# Patient Record
Sex: Male | Born: 1935 | Race: Black or African American | Hispanic: No | Smoking: Former smoker
Health system: Southern US, Community
[De-identification: ages and names within clinical notes are randomized; demographics above are authoritative.]

## PROBLEM LIST (undated history)

## (undated) DIAGNOSIS — K219 Gastro-esophageal reflux disease without esophagitis: Secondary | ICD-10-CM

## (undated) DIAGNOSIS — I10 Essential (primary) hypertension: Secondary | ICD-10-CM

## (undated) DIAGNOSIS — I509 Heart failure, unspecified: Secondary | ICD-10-CM

## (undated) DIAGNOSIS — M109 Gout, unspecified: Secondary | ICD-10-CM

## (undated) HISTORY — DX: Heart failure, unspecified: I50.9

---

## 1998-03-10 ENCOUNTER — Emergency Department (HOSPITAL_COMMUNITY): Admission: EM | Admit: 1998-03-10 | Discharge: 1998-03-10 | Payer: Self-pay | Admitting: Emergency Medicine

## 1998-03-10 ENCOUNTER — Encounter: Payer: Self-pay | Admitting: Emergency Medicine

## 2002-11-14 ENCOUNTER — Encounter: Payer: Self-pay | Admitting: Pulmonary Disease

## 2002-11-14 ENCOUNTER — Ambulatory Visit (HOSPITAL_COMMUNITY): Admission: RE | Admit: 2002-11-14 | Discharge: 2002-11-14 | Payer: Self-pay | Admitting: Pulmonary Disease

## 2003-04-15 ENCOUNTER — Emergency Department (HOSPITAL_COMMUNITY): Admission: EM | Admit: 2003-04-15 | Discharge: 2003-04-15 | Payer: Self-pay

## 2003-04-16 ENCOUNTER — Emergency Department (HOSPITAL_COMMUNITY): Admission: EM | Admit: 2003-04-16 | Discharge: 2003-04-16 | Payer: Self-pay | Admitting: Emergency Medicine

## 2004-02-02 ENCOUNTER — Emergency Department (HOSPITAL_COMMUNITY): Admission: EM | Admit: 2004-02-02 | Discharge: 2004-02-02 | Payer: Self-pay | Admitting: Family Medicine

## 2009-03-05 ENCOUNTER — Emergency Department (HOSPITAL_COMMUNITY): Admission: EM | Admit: 2009-03-05 | Discharge: 2009-03-05 | Payer: Self-pay | Admitting: Family Medicine

## 2009-04-17 ENCOUNTER — Emergency Department (HOSPITAL_COMMUNITY): Admission: EM | Admit: 2009-04-17 | Discharge: 2009-04-17 | Payer: Self-pay | Admitting: Family Medicine

## 2009-04-17 ENCOUNTER — Emergency Department (HOSPITAL_COMMUNITY): Admission: EM | Admit: 2009-04-17 | Discharge: 2009-04-17 | Payer: Self-pay | Admitting: Emergency Medicine

## 2009-09-01 ENCOUNTER — Emergency Department (HOSPITAL_COMMUNITY): Admission: EM | Admit: 2009-09-01 | Discharge: 2009-09-01 | Payer: Self-pay | Admitting: Family Medicine

## 2009-10-01 ENCOUNTER — Inpatient Hospital Stay (HOSPITAL_COMMUNITY): Admission: AD | Admit: 2009-10-01 | Discharge: 2009-10-05 | Payer: Self-pay | Admitting: Cardiology

## 2009-10-04 ENCOUNTER — Encounter (INDEPENDENT_AMBULATORY_CARE_PROVIDER_SITE_OTHER): Payer: Self-pay | Admitting: Cardiology

## 2010-06-04 LAB — COMPREHENSIVE METABOLIC PANEL
ALT: 19 U/L (ref 0–53)
AST: 27 U/L (ref 0–37)
Albumin: 3.3 g/dL — ABNORMAL LOW (ref 3.5–5.2)
Alkaline Phosphatase: 38 U/L — ABNORMAL LOW (ref 39–117)
BUN: 24 mg/dL — ABNORMAL HIGH (ref 6–23)
CO2: 26 mEq/L (ref 19–32)
Calcium: 7.8 mg/dL — ABNORMAL LOW (ref 8.4–10.5)
Chloride: 106 mEq/L (ref 96–112)
Creatinine, Ser: 1.55 mg/dL — ABNORMAL HIGH (ref 0.4–1.5)
GFR calc Af Amer: 52 mL/min — ABNORMAL LOW (ref >60)
GFR calc non Af Amer: 43 mL/min — ABNORMAL LOW (ref >60)
Glucose, Bld: 101 mg/dL — ABNORMAL HIGH (ref 70–99)
Potassium: 3.4 mEq/L — ABNORMAL LOW (ref 3.5–5.1)
Sodium: 141 mEq/L (ref 135–145)
Total Bilirubin: 1.5 mg/dL — ABNORMAL HIGH (ref 0.3–1.2)
Total Protein: 5.2 g/dL — ABNORMAL LOW (ref 6.0–8.3)

## 2010-06-04 LAB — CBC
HCT: 36 % — ABNORMAL LOW (ref 39.0–52.0)
HCT: 36.1 % — ABNORMAL LOW (ref 39.0–52.0)
HCT: 39.8 % (ref 39.0–52.0)
HCT: 40.9 % (ref 39.0–52.0)
Hemoglobin: 12.1 g/dL — ABNORMAL LOW (ref 13.0–17.0)
Hemoglobin: 12.7 g/dL — ABNORMAL LOW (ref 13.0–17.0)
Hemoglobin: 13 g/dL (ref 13.0–17.0)
Hemoglobin: 13.5 g/dL (ref 13.0–17.0)
MCH: 30.2 pg (ref 26.0–34.0)
MCH: 30.9 pg (ref 26.0–34.0)
MCH: 31 pg (ref 26.0–34.0)
MCH: 32.8 pg (ref 26.0–34.0)
MCHC: 32.5 g/dL (ref 30.0–36.0)
MCHC: 33 g/dL (ref 30.0–36.0)
MCHC: 33.5 g/dL (ref 30.0–36.0)
MCHC: 35.2 g/dL (ref 30.0–36.0)
MCV: 92.3 fL (ref 78.0–100.0)
MCV: 92.9 fL (ref 78.0–100.0)
MCV: 93 fL (ref 78.0–100.0)
MCV: 93.6 fL (ref 78.0–100.0)
Platelets: 112 10*3/uL — ABNORMAL LOW (ref 150–400)
Platelets: 144 10*3/uL — ABNORMAL LOW (ref 150–400)
Platelets: 151 10*3/uL (ref 150–400)
Platelets: 170 10*3/uL (ref 150–400)
RBC: 3.87 MIL/uL — ABNORMAL LOW (ref 4.22–5.81)
RBC: 3.91 MIL/uL — ABNORMAL LOW (ref 4.22–5.81)
RBC: 4.29 MIL/uL (ref 4.22–5.81)
RBC: 4.37 MIL/uL (ref 4.22–5.81)
RDW: 16.7 % — ABNORMAL HIGH (ref 11.5–15.5)
RDW: 17 % — ABNORMAL HIGH (ref 11.5–15.5)
RDW: 17 % — ABNORMAL HIGH (ref 11.5–15.5)
RDW: 17.1 % — ABNORMAL HIGH (ref 11.5–15.5)
WBC: 6.4 10*3/uL (ref 4.0–10.5)
WBC: 6.8 10*3/uL (ref 4.0–10.5)
WBC: 7.7 10*3/uL (ref 4.0–10.5)
WBC: 8.1 10*3/uL (ref 4.0–10.5)

## 2010-06-04 LAB — BASIC METABOLIC PANEL
BUN: 23 mg/dL (ref 6–23)
BUN: 25 mg/dL — ABNORMAL HIGH (ref 6–23)
BUN: 26 mg/dL — ABNORMAL HIGH (ref 6–23)
BUN: 28 mg/dL — ABNORMAL HIGH (ref 6–23)
CO2: 26 mEq/L (ref 19–32)
CO2: 29 mEq/L (ref 19–32)
CO2: 32 mEq/L (ref 19–32)
CO2: 33 mEq/L — ABNORMAL HIGH (ref 19–32)
Calcium: 7.7 mg/dL — ABNORMAL LOW (ref 8.4–10.5)
Calcium: 7.8 mg/dL — ABNORMAL LOW (ref 8.4–10.5)
Calcium: 8 mg/dL — ABNORMAL LOW (ref 8.4–10.5)
Calcium: 8.1 mg/dL — ABNORMAL LOW (ref 8.4–10.5)
Chloride: 104 mEq/L (ref 96–112)
Chloride: 107 mEq/L (ref 96–112)
Chloride: 97 mEq/L (ref 96–112)
Chloride: 98 mEq/L (ref 96–112)
Creatinine, Ser: 1.37 mg/dL (ref 0.4–1.5)
Creatinine, Ser: 1.47 mg/dL (ref 0.4–1.5)
Creatinine, Ser: 1.54 mg/dL — ABNORMAL HIGH (ref 0.4–1.5)
Creatinine, Ser: 1.61 mg/dL — ABNORMAL HIGH (ref 0.4–1.5)
GFR calc Af Amer: 50 mL/min — ABNORMAL LOW (ref >60)
GFR calc Af Amer: 53 mL/min — ABNORMAL LOW (ref >60)
GFR calc Af Amer: 55 mL/min — ABNORMAL LOW (ref >60)
GFR calc Af Amer: >60 mL/min (ref >60)
GFR calc non Af Amer: 41 mL/min — ABNORMAL LOW (ref >60)
GFR calc non Af Amer: 43 mL/min — ABNORMAL LOW (ref >60)
GFR calc non Af Amer: 46 mL/min — ABNORMAL LOW (ref >60)
GFR calc non Af Amer: 50 mL/min — ABNORMAL LOW (ref >60)
Glucose, Bld: 101 mg/dL — ABNORMAL HIGH (ref 70–99)
Glucose, Bld: 105 mg/dL — ABNORMAL HIGH (ref 70–99)
Glucose, Bld: 116 mg/dL — ABNORMAL HIGH (ref 70–99)
Glucose, Bld: 137 mg/dL — ABNORMAL HIGH (ref 70–99)
Potassium: 3.5 mEq/L (ref 3.5–5.1)
Potassium: 3.7 mEq/L (ref 3.5–5.1)
Potassium: 4.1 mEq/L (ref 3.5–5.1)
Potassium: 4.9 mEq/L (ref 3.5–5.1)
Sodium: 137 mEq/L (ref 135–145)
Sodium: 140 mEq/L (ref 135–145)
Sodium: 142 mEq/L (ref 135–145)
Sodium: 142 mEq/L (ref 135–145)

## 2010-06-04 LAB — BRAIN NATRIURETIC PEPTIDE
Pro B Natriuretic peptide (BNP): 1128 pg/mL — ABNORMAL HIGH (ref 0.0–100.0)
Pro B Natriuretic peptide (BNP): 2333 pg/mL — ABNORMAL HIGH (ref 0.0–100.0)
Pro B Natriuretic peptide (BNP): 729 pg/mL — ABNORMAL HIGH (ref 0.0–100.0)

## 2010-06-04 LAB — MAGNESIUM: Magnesium: 1.9 mg/dL (ref 1.5–2.5)

## 2010-06-06 LAB — URINE CULTURE
Colony Count: NO GROWTH
Culture: NO GROWTH

## 2010-06-06 LAB — BRAIN NATRIURETIC PEPTIDE: Pro B Natriuretic peptide (BNP): <30 pg/mL (ref 0.0–100.0)

## 2010-06-06 LAB — DIFFERENTIAL
Basophils Absolute: 0 10*3/uL (ref 0.0–0.1)
Basophils Relative: 0 % (ref 0–1)
Eosinophils Absolute: 0 10*3/uL (ref 0.0–0.7)
Eosinophils Relative: 1 % (ref 0–5)
Lymphocytes Relative: 13 % (ref 12–46)
Lymphs Abs: 0.9 10*3/uL (ref 0.7–4.0)
Monocytes Absolute: 0.5 10*3/uL (ref 0.1–1.0)
Monocytes Relative: 8 % (ref 3–12)
Neutro Abs: 5.1 10*3/uL (ref 1.7–7.7)
Neutrophils Relative %: 78 % — ABNORMAL HIGH (ref 43–77)

## 2010-06-06 LAB — PROTIME-INR
INR: 1.09 (ref 0.00–1.49)
Prothrombin Time: 14 seconds (ref 11.6–15.2)

## 2010-06-06 LAB — COMPREHENSIVE METABOLIC PANEL
ALT: 40 U/L (ref 0–53)
AST: 43 U/L — ABNORMAL HIGH (ref 0–37)
Albumin: 4 g/dL (ref 3.5–5.2)
Alkaline Phosphatase: 54 U/L (ref 39–117)
BUN: 14 mg/dL (ref 6–23)
CO2: 23 mEq/L (ref 19–32)
Calcium: 8.4 mg/dL (ref 8.4–10.5)
Chloride: 110 mEq/L (ref 96–112)
Creatinine, Ser: 1.24 mg/dL (ref 0.4–1.5)
GFR calc Af Amer: >60 mL/min (ref >60)
GFR calc non Af Amer: 56 mL/min — ABNORMAL LOW (ref >60)
Glucose, Bld: 104 mg/dL — ABNORMAL HIGH (ref 70–99)
Potassium: 4.1 mEq/L (ref 3.5–5.1)
Sodium: 142 mEq/L (ref 135–145)
Total Bilirubin: 1 mg/dL (ref 0.3–1.2)
Total Protein: 7 g/dL (ref 6.0–8.3)

## 2010-06-06 LAB — CBC
HCT: 41.8 % (ref 39.0–52.0)
Hemoglobin: 14 g/dL (ref 13.0–17.0)
MCHC: 33.4 g/dL (ref 30.0–36.0)
MCV: 94.3 fL (ref 78.0–100.0)
Platelets: 211 10*3/uL (ref 150–400)
RBC: 4.44 MIL/uL (ref 4.22–5.81)
RDW: 13.4 % (ref 11.5–15.5)
WBC: 6.6 10*3/uL (ref 4.0–10.5)

## 2010-06-06 LAB — URINALYSIS, ROUTINE W REFLEX MICROSCOPIC
Ketones, ur: NEGATIVE mg/dL
Leukocytes, UA: NEGATIVE
Nitrite: NEGATIVE
Specific Gravity, Urine: 1.021 (ref 1.005–1.030)
Urobilinogen, UA: 0.2 mg/dL (ref 0.0–1.0)
pH: 5 (ref 5.0–8.0)

## 2010-06-06 LAB — URINE MICROSCOPIC-ADD ON

## 2010-06-06 LAB — POCT CARDIAC MARKERS
CKMB, poc: 3.1 ng/mL (ref 1.0–8.0)
Troponin i, poc: <0.05 ng/mL (ref 0.00–0.09)

## 2010-06-06 LAB — APTT: aPTT: 33 seconds (ref 24–37)

## 2012-02-21 ENCOUNTER — Encounter (HOSPITAL_COMMUNITY): Payer: Self-pay | Admitting: Emergency Medicine

## 2012-02-21 ENCOUNTER — Emergency Department (HOSPITAL_COMMUNITY)
Admission: EM | Admit: 2012-02-21 | Discharge: 2012-02-21 | Disposition: A | Discharge status: Home / Self Care | Payer: Medicare Other | Attending: Emergency Medicine | Admitting: Emergency Medicine

## 2012-02-21 DIAGNOSIS — Z79899 Other long term (current) drug therapy: Secondary | ICD-10-CM | POA: Insufficient documentation

## 2012-02-21 DIAGNOSIS — M79609 Pain in unspecified limb: Secondary | ICD-10-CM | POA: Insufficient documentation

## 2012-02-21 DIAGNOSIS — I10 Essential (primary) hypertension: Secondary | ICD-10-CM | POA: Insufficient documentation

## 2012-02-21 DIAGNOSIS — M25539 Pain in unspecified wrist: Secondary | ICD-10-CM | POA: Insufficient documentation

## 2012-02-21 DIAGNOSIS — G8929 Other chronic pain: Secondary | ICD-10-CM | POA: Insufficient documentation

## 2012-02-21 DIAGNOSIS — IMO0002 Reserved for concepts with insufficient information to code with codable children: Secondary | ICD-10-CM | POA: Insufficient documentation

## 2012-02-21 DIAGNOSIS — M109 Gout, unspecified: Secondary | ICD-10-CM | POA: Insufficient documentation

## 2012-02-21 DIAGNOSIS — M7989 Other specified soft tissue disorders: Secondary | ICD-10-CM | POA: Insufficient documentation

## 2012-02-21 HISTORY — DX: Gout, unspecified: M10.9

## 2012-02-21 HISTORY — DX: Essential (primary) hypertension: I10

## 2012-02-21 MED ORDER — PREDNISONE 20 MG PO TABS: ORAL_TABLET | ORAL | Status: DC

## 2012-02-21 NOTE — ED Provider Notes (Signed)
History   This chart was scribed for [name], MD by Gerlean Ren, ED Scribe. This patient was seen in room TR05C/TR05C and the patient's care was started at 11:04 AM    CSN: [id number]  Arrival date & time 02/21/12  1017   First MD Initiated Contact with Patient 02/21/12 1104      Chief Complaint  Patient presents with  . Leg Pain     The history is provided by the patient. No language interpreter was used.   EDDER Allen is a 78 y.o. male with h/o gout, HTN, and edema who presents to the Emergency Department complaining of chronic waxing-and-waning right leg pain occurring nearly everyday since July with no injury, fall, or trauma as cause that is worsened when bearing weight and ambulating.  No OTC used for pain.  Pt denies pain in the right hip and right knee and states pain feels muscular in nature rather than bone or joint related.  Pt denies color change, weakness, numbness, or swelling in right leg. Pt also complains of a typical gout flare-up in right wrist over past 3 days that is worsened when palpated but denies weakness or numbness in right hand.  Pt is not currently on any medication for gout control.  Pt denies fever, confusion, dyspnea, cough, chest pain, urinary symptoms, irregular bowel movements, blood in stool, incontinence, back pain, sore throat, and rash as associated.  Pt denies tobacco use but reports alcohol use.    Past Medical History  Diagnosis Date  . Gout   . Hypertension     History reviewed. No pertinent past surgical history.  History reviewed. No pertinent family history.  History  Substance Use Topics  . Smoking status: Never Smoker   . Smokeless tobacco: Not on file  . Alcohol Use: Yes      Review of Systems 10 Systems reviewed and are negative for acute change except as noted in the HPI.  Allergies  Review of patient's allergies indicates no known allergies.  Home Medications   Current Outpatient Rx  Name  Route  Sig  Dispense   Refill  . FUROSEMIDE 20 MG PO TABS   Oral   Take 40 mg by mouth daily.         . ISOSORBIDE MONONITRATE ER 60 MG PO TB24   Oral   Take 60 mg by mouth daily.         Marland Kitchen LISINOPRIL 20 MG PO TABS   Oral   Take 40 mg by mouth daily.         Marland Kitchen PREDNISONE 10 MG PO TABS   Oral   Take 5-40 mg by mouth daily.         Marland Kitchen TAMSULOSIN HCL 0.4 MG PO CAPS   Oral   Take 0.4 mg by mouth daily.         Marland Kitchen PREDNISONE 20 MG PO TABS      3 tabs po daily x 3 days   9 tablet   0     BP 167/73  Pulse 68  Temp 99.1 F (37.3 C) (Oral)  Resp 18  SpO2 99%  Physical Exam  Nursing note and vitals reviewed. Constitutional:       Awake, alert, nontoxic appearance with baseline speech.  HENT:  Head: Atraumatic.  Eyes: Pupils are equal, round, and reactive to light. Right eye exhibits no discharge. Left eye exhibits no discharge.  Neck: Neck supple.  Cardiovascular: Normal rate and regular rhythm.  No murmur heard. Pulmonary/Chest: Effort normal and breath sounds normal. No respiratory distress. He has no wheezes. He has no rales. He exhibits no tenderness.  Abdominal: Soft. Bowel sounds are normal. He exhibits no mass. There is no tenderness. There is no rebound.  Musculoskeletal:       Thoracic back: He exhibits no tenderness.       Lumbar back: He exhibits no tenderness.       Bilateral lower extremities non tender without new rashes or color change, baseline ROM, feet warm and dry, CR<2 secs all digits bilaterally, sensation baseline light touch bilaterally for pt, motor symmetric bilateral 5 / 5 hip flexion, quadriceps, hamstrings, EHL, foot dorsiflexion, foot plantarflexion,  Right hip joint non-tender with good ROM, right knee non-tender with good ROM, no edema, right lateral thigh soft tissue tenderness, back non-tender. Right arm, shoulder, elbow non-tender. Right wrist moderately tender with mild swelling and mild erythema, right hand CR<2 seconds, intact median, radial, ulnar  nerve function  Neurological:       Mental status baseline for patient.  Upper extremity motor strength and sensation intact and symmetric bilaterally.  Skin: No rash noted.  Psychiatric: He has a normal mood and affect.    ED Course  Procedures (including critical care time) DIAGNOSTIC STUDIES: Oxygen Saturation is 99% on room air, normal by my interpretation.    COORDINATION OF CARE: 11:15 AM- Patient informed of clinical course, understands medical decision-making process, and agrees with plan.  Discussed narcotic pain medication with pt but pt refused.  Discussed short-term steroid use.  Labs Reviewed - No data to display No results found.   1. Gout   2. Chronic leg pain       MDM  Pt stable in ED with no significant deterioration in condition.  Patient / Family / Caregiver informed of clinical course, understand medical decision-making process, and agree with plan.  I doubt any other EMC precluding discharge at this time including, but not necessarily limited to the following:SBI, cauda equina.  I personally performed the services described in this documentation, which was scribed in my presence. The recorded information has been reviewed and is accurate.        [name], MD 02/26/12 [phone]

## 2012-02-21 NOTE — ED Notes (Signed)
Per EMS: pt from home c/o right leg and right wrist pain; pt sts feels could be gout flare x 3 days; pt sts no meds; pt denies injury

## 2012-02-21 NOTE — ED Notes (Signed)
Pt states right thigh, leg pain- worse when releases leg from weight bearing position. Also has right wrist pain with swelling noted, hx of gout.

## 2012-10-10 ENCOUNTER — Other Ambulatory Visit (HOSPITAL_COMMUNITY): Payer: Self-pay | Admitting: Urology

## 2012-10-10 DIAGNOSIS — C61 Malignant neoplasm of prostate: Secondary | ICD-10-CM

## 2012-10-24 ENCOUNTER — Other Ambulatory Visit (HOSPITAL_COMMUNITY): Payer: Self-pay | Admitting: Urology

## 2012-10-24 DIAGNOSIS — C61 Malignant neoplasm of prostate: Secondary | ICD-10-CM

## 2012-10-28 ENCOUNTER — Encounter (HOSPITAL_COMMUNITY): Payer: Self-pay

## 2012-10-28 ENCOUNTER — Encounter (HOSPITAL_COMMUNITY)
Admission: RE | Admit: 2012-10-28 | Discharge: 2012-10-28 | Disposition: A | Discharge status: Home / Self Care | Payer: Medicare Other | Source: Ambulatory Visit | Attending: Urology | Admitting: Urology

## 2012-10-28 DIAGNOSIS — M25559 Pain in unspecified hip: Secondary | ICD-10-CM | POA: Insufficient documentation

## 2012-10-28 DIAGNOSIS — C61 Malignant neoplasm of prostate: Secondary | ICD-10-CM | POA: Insufficient documentation

## 2012-10-28 DIAGNOSIS — M79609 Pain in unspecified limb: Secondary | ICD-10-CM | POA: Insufficient documentation

## 2012-10-28 MED ORDER — TECHNETIUM TC 99M MEDRONATE IV KIT
26.7000 | PACK | Freq: Once | INTRAVENOUS | Status: AC | PRN
  Administered 2012-10-28: 26.7 via INTRAVENOUS

## 2012-10-29 ENCOUNTER — Ambulatory Visit (HOSPITAL_COMMUNITY)
Admission: RE | Admit: 2012-10-29 | Discharge: 2012-10-29 | Disposition: A | Discharge status: Home / Self Care | Payer: Medicare Other | Source: Ambulatory Visit | Attending: Urology | Admitting: Urology

## 2012-10-29 ENCOUNTER — Other Ambulatory Visit (HOSPITAL_COMMUNITY): Payer: Medicare Other

## 2012-10-29 ENCOUNTER — Other Ambulatory Visit (HOSPITAL_COMMUNITY): Payer: Self-pay | Admitting: Urology

## 2012-10-29 DIAGNOSIS — C61 Malignant neoplasm of prostate: Secondary | ICD-10-CM

## 2012-10-29 DIAGNOSIS — M79609 Pain in unspecified limb: Secondary | ICD-10-CM | POA: Insufficient documentation

## 2012-10-29 DIAGNOSIS — M549 Dorsalgia, unspecified: Secondary | ICD-10-CM | POA: Insufficient documentation

## 2012-10-29 DIAGNOSIS — M1610 Unilateral primary osteoarthritis, unspecified hip: Secondary | ICD-10-CM | POA: Insufficient documentation

## 2012-10-29 DIAGNOSIS — N433 Hydrocele, unspecified: Secondary | ICD-10-CM | POA: Insufficient documentation

## 2012-10-29 MED ORDER — GADOBENATE DIMEGLUMINE 529 MG/ML IV SOLN
20.0000 mL | Freq: Once | INTRAVENOUS | Status: AC | PRN
  Administered 2012-10-29: 20 mL via INTRAVENOUS

## 2012-10-31 ENCOUNTER — Ambulatory Visit (HOSPITAL_COMMUNITY)
Admission: RE | Admit: 2012-10-31 | Discharge: 2012-10-31 | Disposition: A | Discharge status: Home / Self Care | Payer: Medicare Other | Source: Ambulatory Visit | Attending: Urology | Admitting: Urology

## 2012-10-31 ENCOUNTER — Other Ambulatory Visit (HOSPITAL_COMMUNITY): Payer: Self-pay | Admitting: Urology

## 2012-10-31 DIAGNOSIS — Z8546 Personal history of malignant neoplasm of prostate: Secondary | ICD-10-CM | POA: Insufficient documentation

## 2012-10-31 DIAGNOSIS — M898X9 Other specified disorders of bone, unspecified site: Secondary | ICD-10-CM | POA: Insufficient documentation

## 2012-10-31 DIAGNOSIS — M25469 Effusion, unspecified knee: Secondary | ICD-10-CM | POA: Insufficient documentation

## 2012-10-31 DIAGNOSIS — M79609 Pain in unspecified limb: Secondary | ICD-10-CM | POA: Insufficient documentation

## 2012-10-31 DIAGNOSIS — M25859 Other specified joint disorders, unspecified hip: Secondary | ICD-10-CM | POA: Insufficient documentation

## 2012-10-31 DIAGNOSIS — C61 Malignant neoplasm of prostate: Secondary | ICD-10-CM

## 2012-10-31 DIAGNOSIS — M25669 Stiffness of unspecified knee, not elsewhere classified: Secondary | ICD-10-CM | POA: Insufficient documentation

## 2012-11-29 ENCOUNTER — Encounter (HOSPITAL_COMMUNITY): Payer: Self-pay | Admitting: Emergency Medicine

## 2012-11-29 ENCOUNTER — Observation Stay (HOSPITAL_COMMUNITY)
Admission: EM | Admit: 2012-11-29 | Discharge: 2012-12-01 | Disposition: A | Discharge status: Home / Self Care | Payer: Medicare Other | Attending: Internal Medicine | Admitting: Internal Medicine

## 2012-11-29 DIAGNOSIS — M79609 Pain in unspecified limb: Secondary | ICD-10-CM

## 2012-11-29 DIAGNOSIS — R944 Abnormal results of kidney function studies: Secondary | ICD-10-CM | POA: Insufficient documentation

## 2012-11-29 DIAGNOSIS — Z79899 Other long term (current) drug therapy: Secondary | ICD-10-CM | POA: Insufficient documentation

## 2012-11-29 DIAGNOSIS — N400 Enlarged prostate without lower urinary tract symptoms: Secondary | ICD-10-CM

## 2012-11-29 DIAGNOSIS — M609 Myositis, unspecified: Secondary | ICD-10-CM

## 2012-11-29 DIAGNOSIS — R5381 Other malaise: Secondary | ICD-10-CM

## 2012-11-29 DIAGNOSIS — M79606 Pain in leg, unspecified: Secondary | ICD-10-CM

## 2012-11-29 DIAGNOSIS — M109 Gout, unspecified: Secondary | ICD-10-CM

## 2012-11-29 DIAGNOSIS — R319 Hematuria, unspecified: Secondary | ICD-10-CM

## 2012-11-29 DIAGNOSIS — R748 Abnormal levels of other serum enzymes: Secondary | ICD-10-CM | POA: Diagnosis present

## 2012-11-29 DIAGNOSIS — I10 Essential (primary) hypertension: Secondary | ICD-10-CM

## 2012-11-29 DIAGNOSIS — F29 Unspecified psychosis not due to a substance or known physiological condition: Secondary | ICD-10-CM | POA: Insufficient documentation

## 2012-11-29 DIAGNOSIS — R29898 Other symptoms and signs involving the musculoskeletal system: Principal | ICD-10-CM | POA: Insufficient documentation

## 2012-11-29 DIAGNOSIS — R269 Unspecified abnormalities of gait and mobility: Secondary | ICD-10-CM | POA: Diagnosis present

## 2012-11-29 DIAGNOSIS — K219 Gastro-esophageal reflux disease without esophagitis: Secondary | ICD-10-CM | POA: Insufficient documentation

## 2012-11-29 LAB — BASIC METABOLIC PANEL
BUN: 18 mg/dL (ref 6–23)
CO2: 25 mEq/L (ref 19–32)
Chloride: 96 mEq/L (ref 96–112)
Creatinine, Ser: 1.14 mg/dL (ref 0.50–1.35)
Glucose, Bld: 96 mg/dL (ref 70–99)
Potassium: 3.7 mEq/L (ref 3.5–5.1)

## 2012-11-29 LAB — CBC WITH DIFFERENTIAL/PLATELET
Basophils Relative: 0 % (ref 0–1)
HCT: 38.9 % — ABNORMAL LOW (ref 39.0–52.0)
Hemoglobin: 13 g/dL (ref 13.0–17.0)
Lymphs Abs: 1.2 10*3/uL (ref 0.7–4.0)
MCHC: 33.4 g/dL (ref 30.0–36.0)
Monocytes Absolute: 0.8 10*3/uL (ref 0.1–1.0)
Monocytes Relative: 9 % (ref 3–12)
Neutro Abs: 7.1 10*3/uL (ref 1.7–7.7)
Neutrophils Relative %: 77 % (ref 43–77)
RBC: 4.32 MIL/uL (ref 4.22–5.81)

## 2012-11-29 LAB — URINALYSIS, ROUTINE W REFLEX MICROSCOPIC
Bilirubin Urine: NEGATIVE
Glucose, UA: NEGATIVE mg/dL
Ketones, ur: NEGATIVE mg/dL
Nitrite: NEGATIVE
Specific Gravity, Urine: 1.022 (ref 1.005–1.030)
pH: 5 (ref 5.0–8.0)

## 2012-11-29 LAB — HEPATIC FUNCTION PANEL
ALT: 11 U/L (ref 0–53)
AST: 34 U/L (ref 0–37)
Albumin: 3.5 g/dL (ref 3.5–5.2)
Alkaline Phosphatase: 67 U/L (ref 39–117)
Total Protein: 7.1 g/dL (ref 6.0–8.3)

## 2012-11-29 LAB — URINE MICROSCOPIC-ADD ON

## 2012-11-29 LAB — RHEUMATOID FACTOR: Rhuematoid fact SerPl-aCnc: 19 IU/mL — ABNORMAL HIGH (ref <=14)

## 2012-11-29 LAB — TSH: TSH: 3.805 u[IU]/mL (ref 0.350–4.500)

## 2012-11-29 LAB — CK: Total CK: 2805 U/L — ABNORMAL HIGH (ref 7–232)

## 2012-11-29 MED ORDER — PANTOPRAZOLE SODIUM 40 MG PO TBEC
40.0000 mg | DELAYED_RELEASE_TABLET | Freq: Every day | ORAL | Status: DC
  Administered 2012-11-29 – 2012-12-01 (×3): 40 mg via ORAL
  Filled 2012-11-29 (×3): qty 1

## 2012-11-29 MED ORDER — SODIUM CHLORIDE 0.9 % IV SOLN
INTRAVENOUS | Status: DC
  Administered 2012-11-29: 15:00:00 via INTRAVENOUS

## 2012-11-29 MED ORDER — DIAZEPAM 5 MG PO TABS
5.0000 mg | ORAL_TABLET | Freq: Once | ORAL | Status: AC
  Administered 2012-11-29: 5 mg via ORAL
  Filled 2012-11-29: qty 1

## 2012-11-29 MED ORDER — ASPIRIN EC 81 MG PO TBEC
81.0000 mg | DELAYED_RELEASE_TABLET | Freq: Every day | ORAL | Status: DC
  Administered 2012-11-29 – 2012-12-01 (×3): 81 mg via ORAL
  Filled 2012-11-29 (×4): qty 1

## 2012-11-29 MED ORDER — GABAPENTIN 100 MG PO CAPS
100.0000 mg | ORAL_CAPSULE | Freq: Two times a day (BID) | ORAL | Status: DC
  Administered 2012-11-29 – 2012-12-01 (×4): 100 mg via ORAL
  Filled 2012-11-29 (×5): qty 1

## 2012-11-29 MED ORDER — OXYCODONE-ACETAMINOPHEN 5-325 MG PO TABS: 2.0000 | ORAL_TABLET | Freq: Once | ORAL | Status: DC

## 2012-11-29 MED ORDER — SODIUM CHLORIDE 0.9 % IV SOLN
INTRAVENOUS | Status: DC
  Administered 2012-11-29: 05:00:00 via INTRAVENOUS

## 2012-11-29 MED ORDER — HYDROMORPHONE HCL PF 1 MG/ML IJ SOLN
1.0000 mg | Freq: Once | INTRAMUSCULAR | Status: AC
  Administered 2012-11-29: 1 mg via INTRAVENOUS
  Filled 2012-11-29: qty 1

## 2012-11-29 MED ORDER — AMLODIPINE BESYLATE 10 MG PO TABS
10.0000 mg | ORAL_TABLET | Freq: Every day | ORAL | Status: DC
  Administered 2012-11-29 – 2012-12-01 (×3): 10 mg via ORAL
  Filled 2012-11-29 (×3): qty 1

## 2012-11-29 MED ORDER — SODIUM CHLORIDE 0.9 % IV SOLN: INTRAVENOUS | Status: DC

## 2012-11-29 MED ORDER — PREDNISONE 20 MG PO TABS
40.0000 mg | ORAL_TABLET | Freq: Every day | ORAL | Status: DC
  Administered 2012-11-29 – 2012-11-30 (×2): 40 mg via ORAL
  Filled 2012-11-29 (×3): qty 2

## 2012-11-29 MED ORDER — OXYCODONE-ACETAMINOPHEN 5-325 MG PO TABS
1.0000 | ORAL_TABLET | Freq: Once | ORAL | Status: AC
  Administered 2012-11-29: 1 via ORAL
  Filled 2012-11-29: qty 1

## 2012-11-29 MED ORDER — HYDROCODONE-ACETAMINOPHEN 5-325 MG PO TABS: 1.0000 | ORAL_TABLET | ORAL | Status: DC | PRN

## 2012-11-29 NOTE — ED Notes (Addendum)
Patient is looked in by his step children, they have requested a phone call when patient is to be discharged, if patient will be admitted please call after 8:30am. Family states patient has a walker at home but is unable to support himself at home, patient has a wheelchair ramp but does not have a wheelchair.  Rhunette Croft [phone] password "[email or web address]"

## 2012-11-29 NOTE — Progress Notes (Signed)
Clinical Social Work Department BRIEF PSYCHOSOCIAL ASSESSMENT 11/29/2012  Patient:  Jesse Allen, Jesse Allen     Account Number:  [id number]     Admit date:  11/29/2012  Clinical Social Worker:  Candie Chroman  Date/Time:  11/29/2012 12:18 PM  Referred by:  CSW  Date Referred:  11/29/2012 Referred for  Other - See comment   Other Referral:   Possible SNF   Interview type:  Patient Other interview type:    PSYCHOSOCIAL DATA Living Status:  ALONE Admitted from facility:   Level of care:   Primary support name:  Rhunette Croft Primary support relationship to patient:  CHILD, ADULT Degree of support available:   unclear    CURRENT CONCERNS Current Concerns  Other - See comment   Other Concerns:   Possible SNF    SOCIAL WORK ASSESSMENT / PLAN Pt is an 79 yr old gentleman living at home prior to hospitalization. CSW met with him today to assist with d/c planning. Pt state he has been having trouble walking. CSW offered to assist with a ST SNF placement to help him with his walking. Pt has declined SNF stating he would prefer to return home when he is feeling better. PT eval is pending and CSW will return to see pt once recommendations are available.   Assessment/plan status:  Psychosocial Support/Ongoing Assessment of Needs Other assessment/ plan:   Home vs SNF   Information/referral to community resources:   None at this time.    PATIENT'S/FAMILY'S RESPONSE TO PLAN OF CARE: Pt plans to return home following hospital d/c. he has declined ST SNF. CSW is available to assist with rehab placement if pt will reconsider d/c plan.   Cori Razor LCSW [phone]

## 2012-11-29 NOTE — Progress Notes (Signed)
Nutrition Brief Note  Patient identified on the Malnutrition Screening Tool (MST) Report  Wt Readings from Last 15 Encounters:  11/29/12 215 lb (97.523 kg)    Body mass index is 30 kg/(m^2). Patient meets criteria for Obeseity based on current BMI. Pt denies recent weight loss and reports good appetite. Pt states he was eating well PTA with his usual 2 daily meals.  Current diet order is Heart Healthy, patient is consuming approximately 100% of meals at this time. Labs and medications reviewed.   No nutrition interventions warranted at this time. If nutrition issues arise, please consult RD.   Ian Malkin RD, LDN Inpatient Clinical Dietitian Pager: [phone] After Hours Pager: [phone]

## 2012-11-29 NOTE — Progress Notes (Signed)
Patient seen and examined. Still complaining of pain on his thighs bilaterally. On exam appears to be secondary to myositis vs neuropathy. Patient unable to get out of bed by himself. Patient prior to admission was living alone. Requiring IV pain meds. Please referred to H&P note by Dr. Onalee Hua for further info/details on admission.  Plan: -empirically tx with prednisone -low dose neurontin BID to help with pain -continue PRN analgesics -PT/OT evaluation and treatment -will check Vit D level -amlodipine for HTN -most likely will require SNF at least [facility] for rehab.  Alsha Meland [phone]

## 2012-11-29 NOTE — ED Notes (Signed)
Patient states "I went to cook and his leg started hurting him so bad so he got on the ground and couldn't get up." Patient states he has been having problems with his legs for about 3 months. Patient reports he was supposed to be taking Lasix both day and night for his heart but he quit because he thought it was "stealing all his potassium" and he couldn't get no sleep, reports his doctor told him it would be ok to lay off the Lasix for awhile, without any defined period of time, patient states he last saw the heart dr about 6 months ago.

## 2012-11-29 NOTE — ED Provider Notes (Signed)
CSN: [id number]     Arrival date & time 11/29/12  0118 History   First MD Initiated Contact with Patient 11/29/12 0155     Chief Complaint  Patient presents with  . pain with ambulation    (Consider location/radiation/quality/duration/timing/severity/associated sxs/prior Treatment) The history is provided by the patient.   patient here complaining of pain in his legs when he tries to ambulate. Denies any recent falls. Pain is localized to his thighs. Denies any hip pain. No recent fevers. No rashes noted. Pain characterized as dull and better without ambulating. By using pain medication prior to arrival. Denies any swelling in his knees or ankles. Has been diagnosed with osteoporosis in the past. Also notes lower extremity edema secondary to not taking his Lasix. He denies any dyspnea or chest pain. No headaches or shortness of breath. Denies any abdominal pain.  Past Medical History  Diagnosis Date  . Gout   . Hypertension    History reviewed. No pertinent past surgical history. History reviewed. No pertinent family history. History  Substance Use Topics  . Smoking status: Never Smoker   . Smokeless tobacco: Not on file  . Alcohol Use: Yes     Comment: today- "a double shot" (occasional use)    Review of Systems  All other systems reviewed and are negative.    Allergies  Review of patient's allergies indicates no known allergies.  Home Medications  No current outpatient prescriptions on file. BP 181/83  Pulse 81  Temp(Src) 98.5 F (36.9 C) (Oral)  Resp 20  SpO2 95% Physical Exam  Nursing note and vitals reviewed. Constitutional: He is oriented to person, place, and time. He appears well-developed and well-nourished.  Non-toxic appearance. No distress.  HENT:  Head: Normocephalic and atraumatic.  Eyes: Conjunctivae, EOM and lids are normal. Pupils are equal, round, and reactive to light.  Neck: Normal range of motion. Neck supple. No tracheal deviation present. No mass  present.  Cardiovascular: Normal rate, regular rhythm and normal heart sounds.  Exam reveals no gallop.   No murmur heard. Pulmonary/Chest: Effort normal and breath sounds normal. No stridor. No respiratory distress. He has no decreased breath sounds. He has no wheezes. He has no rhonchi. He has no rales.  Abdominal: Soft. Normal appearance and bowel sounds are normal. He exhibits no distension. There is no tenderness. There is no rebound and no CVA tenderness.  Musculoskeletal: Normal range of motion. He exhibits no edema and no tenderness.  Bilateral le edema, no shortening or rotation of his extremities. No rashes noted on his extremities. No joint swelling at the knees or ankles. Full range of motion at the hips  Neurological: He is alert and oriented to person, place, and time. He has normal strength. No cranial nerve deficit or sensory deficit. GCS eye subscore is 4. GCS verbal subscore is 5. GCS motor subscore is 6.  Skin: Skin is warm and dry. No abrasion and no rash noted.  Psychiatric: He has a normal mood and affect. His speech is normal and behavior is normal.    ED Course  Procedures (including critical care time) Labs Review Labs Reviewed  CBC WITH DIFFERENTIAL - Abnormal; Notable for the following:    HCT 38.9 (*)    All other components within normal limits  URINALYSIS, ROUTINE W REFLEX MICROSCOPIC - Abnormal; Notable for the following:    Hgb urine dipstick MODERATE (*)    Protein, ur 30 (*)    All other components within normal limits  URINE  MICROSCOPIC-ADD ON  BASIC METABOLIC PANEL  CK   Imaging Review No results found.  MDM  No diagnosis found. Patient given pain meds here as well as muscle relaxants he continues to be uncomfortable. Patient CK is elevated and he will require admission for evaluation for possible myositis   Toy Baker, MD 11/29/12 [phone]

## 2012-11-29 NOTE — ED Notes (Signed)
Patient undressed, placed in a gown.

## 2012-11-29 NOTE — ED Notes (Signed)
Dr. David at bedside. 

## 2012-11-29 NOTE — H&P (Signed)
  Chief Complaint:  Leg pain  HPI: 79 yo male with months of bilateral leg pain mainly in his bilateral thighs with worsening pain when he tries to stand up and gets very weak.  Denies fevers.  Has seen a urologist and had mri pelvis and bone scan which was unrevealing.  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  Also c/o "pain in tailbone".  Did have some hematuria last month that lasted over a week.  No rashes on body.  No n/v/d.  Was set up to walk with a walker but does not use it.  Is incontinent of urine for many years.  Lives alone.  ED has attempted to ambulate multiple times and cannot walk.  Is on no pain meds at home. No pain in his shoulders.  Review of Systems:  Positive and negative as per HPI otherwise all other systems are negative  Past Medical History: Past Medical History  Diagnosis Date  . Gout   . Hypertension    History reviewed. No pertinent past surgical history.  Medications: Prior to Admission medications   Not on File    Allergies:  No Known Allergies  Social History:  reports that he has never smoked. He does not have any smokeless tobacco history on file. He reports that  drinks alcohol. He reports that he does not use illicit drugs.  Family History: History reviewed. No pertinent family history.  Physical Exam: Filed Vitals:   11/29/12 0127 11/29/12 0503  BP: 181/83 174/69  Pulse: 81 75  Temp: 98.5 F (36.9 C) 98.9 F (37.2 C)  TempSrc: Oral Oral  Resp: 20 16  SpO2: 95% 96%   General appearance: alert, cooperative and no distress Head: Normocephalic, without obvious abnormality, atraumatic Eyes: negative Nose: Nares normal. Septum midline. Mucosa normal. No drainage or sinus tenderness. Neck: no JVD and supple, symmetrical, trachea midline Lungs: clear to auscultation bilaterally Heart: regular rate and rhythm, S1, S2 normal, no murmur, click, rub or gallop Abdomen: soft, non-tender; bowel sounds normal; no masses,  no  organomegaly Extremities: extremities normal, atraumatic, no cyanosis or edema  Pulses: 2+ and symmetric Skin: Skin color, texture, turgor normal. No rashes or lesions Neurologic: Grossly normal  Decreased strength bilateral lower extremities in proximal muscles.   Labs on Admission:   Recent Labs  11/29/12 0230  NA 133*  K 3.7  CL 96  CO2 25  GLUCOSE 96  BUN 18  CREATININE 1.14  CALCIUM 9.0    Recent Labs  11/29/12 0230  WBC 9.2  NEUTROABS 7.1  HGB 13.0  HCT 38.9*  MCV 90.0  PLT 289    Recent Labs  11/29/12 0230  CKTOTAL 2805*    Radiological Exams on Admission:   Assessment/Plan 80 yo male with several months of bilateral lower extremity pain mainly proximal muscles with increased cpk levels who cannot ambulate  Principal Problem:   Gait abnormality Active Problems:   Elevated CPK   Musculoskeletal thigh pain   Hematuria  obs overnight.  Ivf.  Ck sed rate.  Ck lfts.  Possible type of myositis??  Or could just be secondary to arthritis.  Place on oral pain meds.  May need muscle bx.  obs on med surg.  Stephenie Navejas A 11/29/2012, 5:20 AM

## 2012-11-29 NOTE — ED Notes (Signed)
Patient is seen by Dr Luiz Iron at [facility]

## 2012-11-29 NOTE — ED Notes (Signed)
Per EMS, patient from home, lived alone, neighbors look in on him from time to time. Patient reports today he was up and ambulating and about 2 hours ago the pain became untolerable and he was unable to get around due to the pain. Patient states he was seen in the past few months and was told he had ostropenia. Patient was advised to follow up with an orthopedist at that time. Patient has not done so. Patient is self reported non compliant with his medications, states he is supposed to be taking Lasix but isn't. Edema reported to BLE, not significant in pitting. Patient states to EMS that he does not have a PCP but uses the hospitals to fill that role. EMS states patient was able to stand and pivot but did appear to be in significant pain at that time. Lungs CTA, A&Ox4, NAD.

## 2012-11-29 NOTE — ED Notes (Signed)
Patient has BLE 2+ pitting edema to ankles and lower legs, +1 pitting to calfs.

## 2012-11-29 NOTE — ED Notes (Signed)
1 failed IV attempt to L forearm

## 2012-11-30 DIAGNOSIS — R5381 Other malaise: Secondary | ICD-10-CM

## 2012-11-30 DIAGNOSIS — IMO0001 Reserved for inherently not codable concepts without codable children: Secondary | ICD-10-CM

## 2012-11-30 DIAGNOSIS — N400 Enlarged prostate without lower urinary tract symptoms: Secondary | ICD-10-CM

## 2012-11-30 LAB — CBC
HCT: 40.2 % (ref 39.0–52.0)
MCV: 89.7 fL (ref 78.0–100.0)
RBC: 4.48 MIL/uL (ref 4.22–5.81)
RDW: 12.4 % (ref 11.5–15.5)
WBC: 10.4 10*3/uL (ref 4.0–10.5)

## 2012-11-30 LAB — BASIC METABOLIC PANEL
BUN: 19 mg/dL (ref 6–23)
CO2: 25 mEq/L (ref 19–32)
Chloride: 103 mEq/L (ref 96–112)
Creatinine, Ser: 1.12 mg/dL (ref 0.50–1.35)
GFR calc Af Amer: 67 mL/min — ABNORMAL LOW (ref >90)
Potassium: 4.2 mEq/L (ref 3.5–5.1)

## 2012-11-30 MED ORDER — PREDNISONE 20 MG PO TABS
30.0000 mg | ORAL_TABLET | Freq: Every day | ORAL | Status: DC
  Administered 2012-12-01: 30 mg via ORAL
  Filled 2012-11-30 (×2): qty 1

## 2012-11-30 MED ORDER — HYDRALAZINE HCL 20 MG/ML IJ SOLN: 10.0000 mg | Freq: Four times a day (QID) | INTRAMUSCULAR | Status: DC | PRN

## 2012-11-30 MED ORDER — TAMSULOSIN HCL 0.4 MG PO CAPS
0.4000 mg | ORAL_CAPSULE | Freq: Every day | ORAL | Status: DC
  Administered 2012-12-01: 19:00:00 0.4 mg via ORAL
  Filled 2012-11-30 (×2): qty 1

## 2012-11-30 MED ORDER — TRAMADOL HCL 50 MG PO TABS: 50.0000 mg | ORAL_TABLET | Freq: Four times a day (QID) | ORAL | Status: DC | PRN

## 2012-11-30 NOTE — Evaluation (Signed)
Physical Therapy Evaluation Patient Details Name: Jesse Allen MRN: [id number] DOB: [birth year] Today's Date: 11/30/2012 Time: 0940-1010 PT Time Calculation (min): 30 min  PT Assessment / Plan / Recommendation History of Present Illness  79 yo male with months of bilateral leg pain mainly in his bilateral thighs with worsening pain when he tries to stand up and gets very weak.  Denies fevers.  Has seen a urologist and had mri pelvis and bone scan which was unrevealing.  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  Also c/o "pain in tailbone".  Did have some hematuria last month that lasted over a week.  No rashes on body.  No n/v/d.  Was set up to walk with a walker but does not use it.  Is incontinent of urine for many years.  Lives alone.  ED has attempted to ambulate multiple times and cannot walk.  Is on no pain meds at home. No pain in his shoulders.  Clinical Impression  Pt will benefit from Pt to address deficits below; Have cautioned pt not to get up without assist; May need SNF if pt will agree    PT Assessment  Patient needs continued PT services    Follow Up Recommendations  SNF    Does the patient have the potential to tolerate intense rehabilitation      Barriers to Discharge        Equipment Recommendations  Rolling walker with 5" wheels    Recommendations for Other Services     Frequency Min 3X/week    Precautions / Restrictions Precautions Precautions: Fall Restrictions Weight Bearing Restrictions: No   Pertinent Vitals/Pain Min c/o thigh pain      Mobility  Bed Mobility Bed Mobility: Supine to Sit Supine to Sit: 4: Min assist Details for Bed Mobility Assistance: verbal cues for safety, requires incr time;  Transfers Transfers: Sit to Stand;Stand to Sit Sit to Stand: 4: Min assist;From bed;4: Min guard Stand to Sit: 4: Min assist;To chair/3-in-1;4: Min guard Details for Transfer Assistance: verbal cues for wt shift and hand placement; pt required 2 attempts to  stand;  Ambulation/Gait Ambulation/Gait Assistance: 4: Min assist Ambulation Distance (Feet): 140 Feet Assistive device: Rolling walker Ambulation/Gait Assistance Details: pt requires min assist and multi-modal cues for RW dsitance form self, posture, safety Gait Pattern: Step-to pattern;Step-through pattern;Decreased stride length;Trunk flexed Gait velocity: decr    Exercises     PT Diagnosis: Difficulty walking;Generalized weakness  PT Problem List: Decreased strength;Decreased activity tolerance;Decreased balance;Decreased mobility;Decreased knowledge of use of DME PT Treatment Interventions: DME instruction;Gait training;Functional mobility training;Therapeutic activities;Balance training;Patient/family education     PT Goals(Current goals can be found in the care plan section) Acute Rehab PT Goals Patient Stated Goal: to walk  PT Goal Formulation: With patient Time For Goal Achievement: 11/30/12 Potential to Achieve Goals: Good  Visit Information  Last PT Received On: 11/30/12 Assistance Needed: +2 History of Present Illness: 79 yo male with months of bilateral leg pain mainly in his bilateral thighs with worsening pain when he tries to stand up and gets very weak.  Denies fevers.  Has seen a urologist and had mri pelvis and bone scan which was unrevealing.  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  Also c/o "pain in tailbone".  Did have some hematuria last month that lasted over a week.  No rashes on body.  No n/v/d.  Was set up to walk with a walker but does not use it.  Is incontinent of urine for many years.  Lives alone.  ED has attempted to ambulate multiple times and cannot walk.  Is on no pain meds at home. No pain in his shoulders.       Prior Functioning  Home Living Family/patient expects to be discharged to:: Skilled nursing facility Living Arrangements: Alone Prior Function Level of Independence: Independent with assistive device(s) Comments: uses "walking  stick" Communication Communication: No difficulties    Cognition  Cognition Arousal/Alertness: Awake/alert Behavior During Therapy: WFL for tasks assessed/performed Overall Cognitive Status: Within Functional Limits for tasks assessed    Extremity/Trunk Assessment Upper Extremity Assessment Upper Extremity Assessment: Overall WFL for tasks assessed Lower Extremity Assessment Lower Extremity Assessment: RLE deficits/detail;LLE deficits/detail RLE Deficits / Details: grossly 3 to 3+/5; AAROM grossly LLE Deficits / Details: grossly 3 to 3+/5; AAROM grossly [facility]   Balance Static Standing Balance Static Standing - Balance Support: Bilateral upper extremity supported Static Standing - Level of Assistance: 4: Min assist;5: Stand by assistance  End of Session PT - End of Session Activity Tolerance: Patient tolerated treatment well Patient left: in chair;with call bell/phone within reach Nurse Communication: Mobility status  GP Functional Assessment Tool Used: clinical judgement Functional Limitation: Mobility: Walking and moving around Mobility: Walking and Moving Around Current Status (W0981): At least 1 percent but less than 20 percent impaired, limited or restricted Mobility: Walking and Moving Around Goal Status [phone]): At least 1 percent but less than 20 percent impaired, limited or restricted Mobility: Walking and Moving Around Discharge Status [phone]): At least 1 percent but less than 20 percent impaired, limited or restricted   [facility] 11/30/2012, 10:24 AM

## 2012-11-30 NOTE — Evaluation (Addendum)
Occupational Therapy Evaluation Patient Details Name: Jesse Allen MRN: [id number] DOB: Aug 11, 1925 Today's Date: 11/30/2012 Time: 4098-1191 OT Time Calculation (min): 25 min  OT Assessment / Plan / Recommendation History of present illness 79 yo male with months of bilateral leg pain mainly in his bilateral thighs with worsening pain when he tries to stand up and gets very weak.  Denies fevers.  Has seen a urologist and had mri pelvis and bone scan which was unrevealing.  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  Also c/o "pain in tailbone".  Did have some hematuria last month that lasted over a week.  No rashes on body.  No n/v/d.  Was set up to walk with a walker but does not use it.  Is incontinent of urine for many years.  Lives alone.  ED has attempted to ambulate multiple times and cannot walk.  Is on no pain meds at home. No pain in his shoulders.   Clinical Impression   Pt will benefit from skilled OT services to maximize safety with functional transfers and ADL.    OT Assessment  Patient needs continued OT Services    Follow Up Recommendations  SNF;Supervision/Assistance - 24 hour    Barriers to Discharge      Equipment Recommendations  3 in 1 bedside comode    Recommendations for Other Services    Frequency  Min 2X/week    Precautions / Restrictions Precautions Precautions: Fall Restrictions Weight Bearing Restrictions: No   Pertinent Vitals/Pain 4/10; reposition, rest    ADL  Eating/Feeding: Simulated;Independent Where Assessed - Eating/Feeding: Chair Grooming: Simulated;Wash/dry hands;Set up Where Assessed - Grooming: Unsupported sitting Upper Body Bathing: Simulated;Chest;Right arm;Left arm;Abdomen;Set up Where Assessed - Upper Body Bathing: Unsupported sitting Lower Body Bathing: Simulated;Minimal assistance Where Assessed - Lower Body Bathing: Supported sit to stand Upper Body Dressing: Simulated;Set up Where Assessed - Upper Body Dressing: Unsupported  sitting Lower Body Dressing: Simulated;Minimal assistance Where Assessed - Lower Body Dressing: Supported sit to stand Toilet Transfer: Simulated;Minimal assistance Toileting - Clothing Manipulation and Hygiene: Simulated;Minimal assistance Where Assessed - Engineer, mining and Hygiene: Sit to stand from 3-in-1 or toilet Equipment Used: Rolling walker ADL Comments: Pt tends to push walker too far out in front of him and also picks up walker at times unsafely despite verbal cues for safe use of walker.     OT Diagnosis: Generalized weakness  OT Problem List: Decreased strength;Decreased knowledge of use of DME or AE OT Treatment Interventions: Self-care/ADL training;Patient/family education;Therapeutic activities   OT Goals(Current goals can be found in the care plan section) Acute Rehab OT Goals Patient Stated Goal: to walk  OT Goal Formulation: With patient Time For Goal Achievement: 12/07/12 Potential to Achieve Goals: Good  Visit Information  Last OT Received On: 11/30/12 Assistance Needed: +1 History of Present Illness: 79 yo male with months of bilateral leg pain mainly in his bilateral thighs with worsening pain when he tries to stand up and gets very weak.  Denies fevers.  Has seen a urologist and had mri pelvis and bone scan which was unrevealing.  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  Also c/o "pain in tailbone".  Did have some hematuria last month that lasted over a week.  No rashes on body.  No n/v/d.  Was set up to walk with a walker but does not use it.  Is incontinent of urine for many years.  Lives alone.  ED has attempted to ambulate multiple times and cannot walk.  Is on no pain  meds at home. No pain in his shoulders.       Prior Functioning     Home Living Family/patient expects to be discharged to:: Skilled nursing facility Living Arrangements: Alone Prior Function Level of Independence: Independent with assistive device(s) Comments: uses "walking  stick"--later he reports to OT that he uses a walker "like this one" at home Communication Communication: No difficulties         Vision/Perception     Cognition  Cognition Arousal/Alertness: Awake/alert Behavior During Therapy: WFL for tasks assessed/performed Overall Cognitive Status: Within Functional Limits for tasks assessed    Extremity/Trunk Assessment Upper Extremity Assessment Upper Extremity Assessment: Overall WFL for tasks assessed     Mobility Transfers Transfers: Sit to Stand;Stand to Sit Sit to Stand: 4: Min guard;With upper extremity assist;From chair/3-in-1 Stand to Sit: 4: Min guard;With upper extremity assist;To chair/3-in-1 Details for Transfer Assistance: verbal cues for hand placement and safety     Exercise     Balance     End of Session OT - End of Session Activity Tolerance: Patient tolerated treatment well Patient left: in chair;with call bell/phone within reach  GO Functional Assessment Tool Used: clinical judgement Functional Limitation: Self care Self Care Current Status (Z6109): At least 1 percent but less than 20 percent impaired, limited or restricted Self Care Goal Status (U0454): At least 1 percent but less than 20 percent impaired, limited or restricted   Lennox Laity [phone] 11/30/2012, 4:23 PM

## 2012-11-30 NOTE — Progress Notes (Signed)
[facility] PROGRESS NOTE  Jesse Allen HYQ:[id number] DOB: 30-Jul-1925 DOA: 11/29/2012 PCP: No primary provider on file.  Assessment/Plan: 1-Bilateral thigh pain and weakness: most likely due to myositis vs OA. -excellent respond to steroids -will repeat CK levels in am -follow also Vit D level -there is degree of deconditioning also causing weakness; will need SNF for rehab as recommended by PT/OT -continue neurontin -continue prednisone  2-HTN: started on norvasc and also his flomax will also help controlling BP  3-BPH: patient increase frequency and sensation of not emptying his bladder properly. -will start flomax  4-physical deconditioning: will follow PT/OT rec's and will ask SW to help with short term SNF placement for rehab  5-Confusion: Slight confusion last night with use of IV narcotics. -AAOX3 today -will d/c IV narcotics -will use tramadol for pain -continue neurontin  6-GERD and GI prophylaxis: continue PPI  DVT:SCD's   Code Status: Full Family Communication: no family at bedside  Disposition Plan: to be determine; but rec's per PT/OT are for ST SNF at discharge for physical rehab   Consultants:  PT/OT  Procedures:  None   Antibiotics:  None   HPI/Subjective: Afebrile; feeling better. Denies CP and SOB.  Objective: Filed Vitals:   11/30/12 1352  BP: 160/71  Pulse: 74  Temp: 97.9 F (36.6 C)  Resp: 16    Intake/Output Summary (Last 24 hours) at 11/30/12 1743 Last data filed at 11/30/12 1352  Gross per 24 hour  Intake 694.67 ml  Output   2350 ml  Net -1655.33 ml   Filed Weights   11/29/12 0530  Weight: 97.523 kg (215 lb)    Exam:   General:  NAD, feeling a lot better; afebrile  Cardiovascular: S1 and S2, no rubs or gallops  Respiratory: CTA bilaterally  Abdomen: soft, NT, ND, positive BS  Musculoskeletal: trace edema, no cyanosis; improved MS bilaterally especially on his thighs  Data Reviewed: Basic Metabolic  Panel:  Recent Labs Lab 11/29/12 0230 11/30/12 0729  NA 133* 137  K 3.7 4.2  CL 96 103  CO2 25 25  GLUCOSE 96 112*  BUN 18 19  CREATININE 1.14 1.12  CALCIUM 9.0 8.8  MG 2.0  --    Liver Function Tests:  Recent Labs Lab 11/29/12 0230  AST 34  ALT 11  ALKPHOS 67  BILITOT 0.3  PROT 7.1  ALBUMIN 3.5   CBC:  Recent Labs Lab 11/29/12 0230 11/30/12 0729  WBC 9.2 10.4  NEUTROABS 7.1  --   HGB 13.0 13.7  HCT 38.9* 40.2  MCV 90.0 89.7  PLT 289 283   Cardiac Enzymes:  Recent Labs Lab 11/29/12 0230  CKTOTAL 2805*    Studies: No results found.  Scheduled Meds: . amLODipine  10 mg Oral Daily  . aspirin EC  81 mg Oral Daily  . gabapentin  100 mg Oral BID  . pantoprazole  40 mg Oral Q1200  . [START ON 12/01/2012] predniSONE  30 mg Oral Q breakfast  . tamsulosin  0.4 mg Oral QPC supper   Continuous Infusions: . sodium chloride Stopped (11/30/12 0144)    Principal Problem:   Gait abnormality Active Problems:   Elevated CPK   Musculoskeletal thigh pain   Hematuria    Time spent: >30 minutes    Jesse Allen  [facility] Pager [phone]. If 7PM-7AM, please contact night-coverage at [email or web address], password [facility] 11/30/2012, 5:43 PM  LOS: 1 day

## 2012-11-30 NOTE — Progress Notes (Signed)
Unable to convince pt to take Neurontin and/or Flomax this pm. He states"these medications caused all of his problems last night". Reeducated in uses of these medications without success.

## 2012-12-01 DIAGNOSIS — I10 Essential (primary) hypertension: Secondary | ICD-10-CM

## 2012-12-01 LAB — CBC
HCT: 38.5 % — ABNORMAL LOW (ref 39.0–52.0)
MCHC: 33.8 g/dL (ref 30.0–36.0)
MCV: 88.9 fL (ref 78.0–100.0)
RDW: 12.2 % (ref 11.5–15.5)

## 2012-12-01 LAB — BASIC METABOLIC PANEL
BUN: 28 mg/dL — ABNORMAL HIGH (ref 6–23)
Chloride: 100 mEq/L (ref 96–112)
Creatinine, Ser: 1.19 mg/dL (ref 0.50–1.35)
GFR calc Af Amer: 62 mL/min — ABNORMAL LOW (ref >90)
GFR calc non Af Amer: 54 mL/min — ABNORMAL LOW (ref >90)

## 2012-12-01 MED ORDER — AMLODIPINE BESYLATE 10 MG PO TABS: 10.0000 mg | ORAL_TABLET | Freq: Every day | ORAL | Status: DC

## 2012-12-01 MED ORDER — PANTOPRAZOLE SODIUM 40 MG PO TBEC: 40.0000 mg | DELAYED_RELEASE_TABLET | Freq: Every day | ORAL | Status: DC

## 2012-12-01 MED ORDER — ASPIRIN 81 MG PO TBEC: 81.0000 mg | DELAYED_RELEASE_TABLET | Freq: Every day | ORAL | Status: DC

## 2012-12-01 MED ORDER — TAMSULOSIN HCL 0.4 MG PO CAPS: 0.4000 mg | ORAL_CAPSULE | Freq: Every day | ORAL | Status: DC

## 2012-12-01 MED ORDER — PREDNISONE 10 MG PO TABS: 30.0000 mg | ORAL_TABLET | Freq: Every day | ORAL | Status: DC

## 2012-12-01 MED ORDER — FUROSEMIDE 20 MG PO TABS: 20.0000 mg | ORAL_TABLET | Freq: Every day | ORAL | Status: DC

## 2012-12-01 MED ORDER — GABAPENTIN 100 MG PO CAPS: 100.0000 mg | ORAL_CAPSULE | Freq: Two times a day (BID) | ORAL | Status: DC

## 2012-12-01 MED ORDER — TRAMADOL HCL 50 MG PO TABS: 50.0000 mg | ORAL_TABLET | Freq: Four times a day (QID) | ORAL | Status: DC | PRN

## 2012-12-01 NOTE — Progress Notes (Signed)
Clinical Social Work Department BRIEF PSYCHOSOCIAL ASSESSMENT 12/01/2012  Patient:  Jesse Allen, Jesse Allen     Account Number:  [id number]     Admit date:  11/29/2012  Clinical Social Worker:  Doroteo Glassman  Date/Time:  12/01/2012 10:32 AM  Referred by:  Physician  Date Referred:  12/01/2012 Referred for  SNF Placement   Other Referral:   Interview type:  Patient Other interview type:    PSYCHOSOCIAL DATA Living Status:  ALONE Admitted from facility:   Level of care:   Primary support name:  Rhunette Croft Primary support relationship to patient:  CHILD, ADULT Degree of support available:   poor    CURRENT CONCERNS Current Concerns  Post-Acute Placement   Other Concerns:    SOCIAL WORK ASSESSMENT / PLAN Met with Pt to discuss d/c plans.    Pt is adamant that he can go home, even after CSW discussed with Pt the MD and PT's recommendations.  It became apparent to CSW that Pt was viewing SNF as long-term placement.  CSW then discussed SNF and the process.  Pt was more amenable to SNF but ultimately decided that he wanted to try going home first.    CSW provided Pt with a SNF list and encouraged him to contact his PCP if he decides that he does need SNF.    CSW thanked Pt for his time.   Assessment/plan status:  Psychosocial Support/Ongoing Assessment of Needs Other assessment/ plan:   Information/referral to community resources:   SNF list    PATIENT'S/FAMILY'S RESPONSE TO PLAN OF CARE: Pt lives alone and reported that he has no one to look in on him.  He stated, however, that he's done very well on his own and that he understands that he may need to hire someone to assist him in the home.    Pt seems to be coping well with his decision and is eager to get back on his feet.    Pt thanked CSW for time and assistance.   Providence Crosby, LCSWA Clinical Social Work [phone]

## 2012-12-01 NOTE — Progress Notes (Signed)
   CARE MANAGEMENT NOTE 12/01/2012  Patient:  Jesse Allen, Jesse Allen   Account Number:  [id number]  Date Initiated:  12/01/2012  Documentation initiated by:  [facility]  Subjective/Objective Assessment:     Action/Plan:   lives alone   Anticipated DC Date:     Anticipated DC Plan:  HOME W HOME HEALTH SERVICES      DC Planning Services  CM consult      [facility] Choice  HOME HEALTH   Choice offered to / List presented to:  C-1 Patient   DME arranged  Levan Hurst      DME agency  [facility]     HH arranged  HH-2 PT  HH-3 OT  HH-1 RN      [facility] agency  [facility]   Status of service:  Completed, signed off Medicare Important Message given?   (If response is "NO", the following Medicare IM given date fields will be blank) Date Medicare IM given:   Date Additional Medicare IM given:    Discharge Disposition:  HOME W HOME HEALTH SERVICES  Per UR Regulation:    If discussed at Long Length of Stay Meetings, dates discussed:    Comments:  12/01/2012 1220 NCM spoke to pt and offered choice for [facility]. Pt agreeable to [facility] for HH. Pt states he has walker at home but it does not have wheels. Has a bedside commode. States he wants to wait to see how strong he will be at time of dc to determine if he wants to go to SNF-rehab. NCM will continue to follow up until dc for disposition. Isidoro Donning RN CCM Case Mgmt phone [phone]

## 2012-12-01 NOTE — Progress Notes (Signed)
Transported to family auto via w/c. Step daughter accompanying pt.

## 2012-12-01 NOTE — Progress Notes (Signed)
   CARE MANAGEMENT NOTE 12/01/2012  Patient:  LIAHM, GRIVAS   Account Number:  [id number]  Date Initiated:  12/01/2012  Documentation initiated by:  [facility]  Subjective/Objective Assessment:     Action/Plan:   lives alone   Anticipated DC Date:     Anticipated DC Plan:  HOME W HOME HEALTH SERVICES      DC Planning Services  CM consult      [facility] Choice  HOME HEALTH   Choice offered to / List presented to:  C-1 Patient           Status of service:  In process, will continue to follow Medicare Important Message given?   (If response is "NO", the following Medicare IM given date fields will be blank) Date Medicare IM given:   Date Additional Medicare IM given:    Discharge Disposition:    Per UR Regulation:    If discussed at Long Length of Stay Meetings, dates discussed:    Comments:  12/01/2012 1220 NCM spoke to pt and offered choice for [facility]. Pt agreeable to [facility] for HH. Pt states he has walker at home but it does not have wheels. Has a bedside commode. States he wants to wait to see how strong he will be at time of dc to determine if he wants to go to SNF-rehab. NCM will continue to follow up until dc for disposition. Isidoro Donning RN CCM Case Mgmt phone [phone]

## 2012-12-01 NOTE — Progress Notes (Signed)
Physical Therapy Treatment Patient Details Name: Jesse Allen MRN: [id number] DOB: [birth year] Today's Date: 12/01/2012 Time: 4098-1191 PT Time Calculation (min): 39 min  PT Assessment / Plan / Recommendation  History of Present Illness     PT Comments   Pt progressing today; thigh pain bettter, increases with full trunk and hip extension; Will continue to follow, pt would benefit from STSNF but he is still reluctant, if home he will need RW and HHPT;  Follow Up Recommendations  SNF (vs HHPT depending on progress;)     Does the patient have the potential to tolerate intense rehabilitation     Barriers to Discharge        Equipment Recommendations  Rolling walker with 5" wheels    Recommendations for Other Services    Frequency Min 3X/week   Progress towards PT Goals Progress towards PT goals: Progressing toward goals  Plan Current plan remains appropriate    Precautions / Restrictions Precautions Precautions: Fall Restrictions Weight Bearing Restrictions: No   Pertinent Vitals/Pain     Mobility  Bed Mobility Bed Mobility: Not assessed Transfers Transfers: Sit to Stand;Stand to Sit Sit to Stand: 5: Supervision;From chair/3-in-1 Stand to Sit: 5: Supervision;To chair/3-in-1 Details for Transfer Assistance: verbal cues for hand placement and safety Ambulation/Gait Ambulation/Gait Assistance: 5: Supervision;4: Min guard Ambulation Distance (Feet): 380 Feet Assistive device: Rolling walker Ambulation/Gait Assistance Details: multi-modal cues for posture and RW position from self Gait Pattern: Step-through pattern;Trunk flexed General Gait Details: incr activity tolerance with gait today; 2 standing rests     Exercises     PT Diagnosis:    PT Problem List:   PT Treatment Interventions:     PT Goals (current goals can now be found in the care plan section) Acute Rehab PT Goals Patient Stated Goal: go home Time For Goal Achievement: 11/30/12 Potential to Achieve  Goals: Good  Visit Information  Last PT Received On: 12/01/12 Assistance Needed: +1    Subjective Data  Subjective: they won't let me walk!! Patient Stated Goal: go home   Cognition  Cognition Arousal/Alertness: Awake/alert Behavior During Therapy: WFL for tasks assessed/performed Overall Cognitive Status: Within Functional Limits for tasks assessed    Balance  Balance Balance Assessed: Yes High Level Balance High Level Balance Activites: Backward walking;Direction changes;Turns;Sudden stops;Head turns High Level Balance Comments: pt without LOB during actvities today, using RW throughout  End of Session PT - End of Session Equipment Utilized During Treatment: Gait belt Activity Tolerance: Patient tolerated treatment well Patient left: in chair;with call bell/phone within reach;with family/visitor present;with chair alarm set Nurse Communication: Mobility status   GP     [facility] 12/01/2012, 11:49 AM

## 2012-12-01 NOTE — Progress Notes (Signed)
This Clinical research associate spoke with Elisabeth Pigeon, MD and asked that she clarify need for 1 Unit PRBC's as this is an uncommon order for a lab of MB 6.5 and also an unfamiliar order on this unit. She stated she thought it was for Hgb. Will cancel Blood administration. Called Admin Coordinator to have pt creditted as this lab was not needed. Will notify Unit Director per [facility] instruction so she can credit pt on Monday. Made Sheena in Blood Bank aware of cancelled orders.

## 2012-12-01 NOTE — Progress Notes (Signed)
Recalled MD as this writer questioned by 2 more experienced RNs about the validity of order to give blood for Critical  MB 6.5. No response as of yet.

## 2012-12-01 NOTE — Discharge Summary (Signed)
Physician Discharge Summary  Jesse Allen:[id number] DOB: 14-May-1925 DOA: 11/29/2012  PCP: No primary provider on file.  Admit date: 11/29/2012 Discharge date: 12/01/2012  Time spent: >30 minutes  Recommendations for Outpatient Follow-up:  1. BMET to follow electrolytes and kidney function 2. Reassess BP and adjust medications as needed 3. Recheck CK levels and patient muscular weakness improvements; might require rheumatology consultation  Discharge Diagnoses:  Weakness on his upper thigh (bilaterally) Gait abnormality Elevated CPK HTN BPH GERd Physical deconditioning   Discharge Condition: stable and improved. Discharge home with home health services and rolling walker DME. Will follow acute at the [facility] and has been advised to establish care with primary provider.  Diet recommendation: heart healthy diet  Filed Weights   11/29/12 0530  Weight: 97.523 kg (215 lb)    History of present illness:  79 yo male with months of bilateral leg pain mainly in his bilateral thighs with worsening pain when he tries to stand up and gets very weak. Denies fevers. Has seen a urologist and had mri pelvis and bone scan which was unrevealing. 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 Also c/o "pain in tailbone". Did have some hematuria last month that lasted over a week. No rashes on body. No n/v/d. Was set up to walk with a walker but does not use it. Is incontinent of urine for many years. Lives alone. ED has attempted to ambulate multiple times and cannot walk. Is on no pain meds at home. No pain in his shoulders.  Hospital Course:  1-Bilateral thigh pain and weakness: most likely due to myositis vs OA vs Gout.  -excellent respond to steroids  -CK stable and no signs of hematuria. -will follow also Vit D level as an outpatient and supplemented as needed -there is degree of deconditioning also causing weakness; will need SNF for rehab as recommended by PT/OT; but patient has  declined offer and will like to go home with [facility] services. HHPT, HHOT, HHRN arranged a discharge -will continue neurontin and PRN tramadol for pain -continue prednisone daily for a total of 10 days  2-HTN: started on norvasc and lasix, and also his flomax will help controlling BP   3-BPH: patient with increase frequency and sensation of not emptying his bladder properly.  -will discharge on flomax   4-physical deconditioning: patient has refused SNF for rehab. HH services arranged as mentioned above.   5-Confusion: Slight confusion first night after admission  with use of IV narcotics.  -since then AAOX3 until day of discharge -will use neurontin and PRN tramadol for pain   6-GERD and GI prophylaxis: continue PPI  *Patient advised to establish care with primary care physician and to follow heart healthy diet. Will arrange acute [facility].  Procedures:  See below for x-ray reports  Consultations:  PT/OT  Discharge Exam: Filed Vitals:   12/01/12 1430  BP: 165/80  Pulse: 64  Temp: 98.4 F (36.9 C)  Resp: 18    General: NAD, afebrile; feeling stronger and refusing SNF for rehab. Cardiovascular: S1 and S2, no rubs or gallops Respiratory: CTA bilaterally Abdomen: soft, NT, ND, positive BS Extremities: no edema or cyanosis; upper thighs strength has improved and patient is now able to ambulate with use of a walker   Discharge Instructions  Discharge Orders   Future Orders Complete By Expires   Discharge instructions  As directed    Comments:     Establish care with Primary care doctor  Take medications as prescribed Follow a low sodium heart healthy diet       Medication List         amLODipine 10 MG tablet  Commonly known as:  NORVASC  Take 1 tablet (10 mg total) by mouth daily.     aspirin 81 MG EC tablet  Take 1 tablet (81 mg total) by mouth daily.     gabapentin 100 MG capsule  Commonly known as:  NEURONTIN  Take 1  capsule (100 mg total) by mouth 2 (two) times daily.     pantoprazole 40 MG tablet  Commonly known as:  PROTONIX  Take 1 tablet (40 mg total) by mouth daily at 12 noon.     predniSONE 10 MG tablet  Commonly known as:  DELTASONE  Take 3 tablets (30 mg total) by mouth daily with breakfast.     tamsulosin 0.4 MG Caps capsule  Commonly known as:  FLOMAX  Take 1 capsule (0.4 mg total) by mouth daily after supper.     traMADol 50 MG tablet  Commonly known as:  ULTRAM  Take 1 tablet (50 mg total) by mouth every 6 (six) hours as needed for pain.       No Known Allergies     Follow-up Information   Follow up with [facility].   Contact information:   [address] [place] [address] [phone]       Follow up with [facility]. Schedule an appointment as soon as possible for a visit in 10 days.   Contact information:   [address] [place] [address] [phone]       The results of significant diagnostics from this hospitalization (including imaging, microbiology, ancillary and laboratory) are listed below for reference.     Labs: Basic Metabolic Panel:  Recent Labs Lab 11/29/12 0230 11/30/12 0729 12/01/12 0430  NA 133* 137 135  K 3.7 4.2 4.3  CL 96 103 100  CO2 25 25 25   GLUCOSE 96 112* 102*  BUN 18 19 28*  CREATININE 1.14 1.12 1.19  CALCIUM 9.0 8.8 8.9  MG 2.0  --   --    Liver Function Tests:  Recent Labs Lab 11/29/12 0230  AST 34  ALT 11  ALKPHOS 67  BILITOT 0.3  PROT 7.1  ALBUMIN 3.5   CBC:  Recent Labs Lab 11/29/12 0230 11/30/12 0729 12/01/12 0430  WBC 9.2 10.4 9.8  NEUTROABS 7.1  --   --   HGB 13.0 13.7 13.0  HCT 38.9* 40.2 38.5*  MCV 90.0 89.7 88.9  PLT 289 283 313   Cardiac Enzymes:  Recent Labs Lab 11/29/12 0230 12/01/12 0430  CKTOTAL 2805* 3984*  CKMB  --  6.5*    Signed:  Eliya Bubar  [facility] 12/01/2012, 3:06 PM

## 2012-12-04 LAB — VITAMIN D 1,25 DIHYDROXY
Vitamin D 1, 25 (OH)2 Total: 45 pg/mL (ref 18–72)
Vitamin D2 1, 25 (OH)2: <8 pg/mL

## 2014-02-08 ENCOUNTER — Inpatient Hospital Stay (HOSPITAL_COMMUNITY)
Admission: EM | Admit: 2014-02-08 | Discharge: 2014-02-16 | DRG: 292 | Disposition: A | Discharge status: Skilled Nursing Facility | Payer: Medicare Other | Attending: Internal Medicine | Admitting: Internal Medicine

## 2014-02-08 ENCOUNTER — Emergency Department (HOSPITAL_COMMUNITY): Payer: Medicare Other

## 2014-02-08 ENCOUNTER — Encounter (HOSPITAL_COMMUNITY): Payer: Self-pay | Admitting: Emergency Medicine

## 2014-02-08 DIAGNOSIS — I081 Rheumatic disorders of both mitral and tricuspid valves: Secondary | ICD-10-CM | POA: Diagnosis not present

## 2014-02-08 DIAGNOSIS — R269 Unspecified abnormalities of gait and mobility: Secondary | ICD-10-CM

## 2014-02-08 DIAGNOSIS — I517 Cardiomegaly: Secondary | ICD-10-CM | POA: Diagnosis present

## 2014-02-08 DIAGNOSIS — R740 Nonspecific elevation of levels of transaminase and lactic acid dehydrogenase [LDH]: Secondary | ICD-10-CM | POA: Diagnosis not present

## 2014-02-08 DIAGNOSIS — M1389 Other specified arthritis, multiple sites: Secondary | ICD-10-CM | POA: Diagnosis present

## 2014-02-08 DIAGNOSIS — M25551 Pain in right hip: Secondary | ICD-10-CM | POA: Diagnosis present

## 2014-02-08 DIAGNOSIS — R0601 Orthopnea: Secondary | ICD-10-CM | POA: Diagnosis not present

## 2014-02-08 DIAGNOSIS — R0600 Dyspnea, unspecified: Secondary | ICD-10-CM

## 2014-02-08 DIAGNOSIS — R609 Edema, unspecified: Secondary | ICD-10-CM | POA: Diagnosis present

## 2014-02-08 DIAGNOSIS — C61 Malignant neoplasm of prostate: Secondary | ICD-10-CM | POA: Diagnosis not present

## 2014-02-08 DIAGNOSIS — E669 Obesity, unspecified: Secondary | ICD-10-CM | POA: Diagnosis present

## 2014-02-08 DIAGNOSIS — R0602 Shortness of breath: Secondary | ICD-10-CM | POA: Diagnosis present

## 2014-02-08 DIAGNOSIS — K761 Chronic passive congestion of liver: Secondary | ICD-10-CM | POA: Diagnosis present

## 2014-02-08 DIAGNOSIS — Z9181 History of falling: Secondary | ICD-10-CM

## 2014-02-08 DIAGNOSIS — I272 Other secondary pulmonary hypertension: Secondary | ICD-10-CM | POA: Diagnosis not present

## 2014-02-08 DIAGNOSIS — N179 Acute kidney failure, unspecified: Secondary | ICD-10-CM | POA: Diagnosis not present

## 2014-02-08 DIAGNOSIS — E785 Hyperlipidemia, unspecified: Secondary | ICD-10-CM | POA: Diagnosis not present

## 2014-02-08 DIAGNOSIS — I878 Other specified disorders of veins: Secondary | ICD-10-CM | POA: Diagnosis present

## 2014-02-08 DIAGNOSIS — M25561 Pain in right knee: Secondary | ICD-10-CM | POA: Diagnosis present

## 2014-02-08 DIAGNOSIS — Z7982 Long term (current) use of aspirin: Secondary | ICD-10-CM | POA: Diagnosis not present

## 2014-02-08 DIAGNOSIS — Z79899 Other long term (current) drug therapy: Secondary | ICD-10-CM

## 2014-02-08 DIAGNOSIS — Z23 Encounter for immunization: Secondary | ICD-10-CM | POA: Diagnosis not present

## 2014-02-08 DIAGNOSIS — I10 Essential (primary) hypertension: Secondary | ICD-10-CM

## 2014-02-08 DIAGNOSIS — Y901 Blood alcohol level of 20-39 mg/100 ml: Secondary | ICD-10-CM | POA: Diagnosis not present

## 2014-02-08 DIAGNOSIS — R001 Bradycardia, unspecified: Secondary | ICD-10-CM | POA: Diagnosis not present

## 2014-02-08 DIAGNOSIS — I129 Hypertensive chronic kidney disease with stage 1 through stage 4 chronic kidney disease, or unspecified chronic kidney disease: Secondary | ICD-10-CM | POA: Diagnosis not present

## 2014-02-08 DIAGNOSIS — I472 Ventricular tachycardia: Secondary | ICD-10-CM | POA: Diagnosis not present

## 2014-02-08 DIAGNOSIS — M109 Gout, unspecified: Secondary | ICD-10-CM | POA: Diagnosis not present

## 2014-02-08 DIAGNOSIS — N189 Chronic kidney disease, unspecified: Secondary | ICD-10-CM | POA: Diagnosis present

## 2014-02-08 DIAGNOSIS — I5021 Acute systolic (congestive) heart failure: Secondary | ICD-10-CM | POA: Diagnosis present

## 2014-02-08 DIAGNOSIS — M25552 Pain in left hip: Secondary | ICD-10-CM | POA: Diagnosis present

## 2014-02-08 DIAGNOSIS — M25562 Pain in left knee: Secondary | ICD-10-CM | POA: Diagnosis present

## 2014-02-08 DIAGNOSIS — G629 Polyneuropathy, unspecified: Secondary | ICD-10-CM | POA: Diagnosis not present

## 2014-02-08 DIAGNOSIS — Z7952 Long term (current) use of systemic steroids: Secondary | ICD-10-CM | POA: Diagnosis not present

## 2014-02-08 DIAGNOSIS — F1010 Alcohol abuse, uncomplicated: Secondary | ICD-10-CM | POA: Diagnosis not present

## 2014-02-08 DIAGNOSIS — M129 Arthropathy, unspecified: Secondary | ICD-10-CM | POA: Diagnosis present

## 2014-02-08 DIAGNOSIS — Z6833 Body mass index (BMI) 33.0-33.9, adult: Secondary | ICD-10-CM | POA: Diagnosis not present

## 2014-02-08 DIAGNOSIS — I5023 Acute on chronic systolic (congestive) heart failure: Principal | ICD-10-CM | POA: Diagnosis present

## 2014-02-08 DIAGNOSIS — I509 Heart failure, unspecified: Secondary | ICD-10-CM | POA: Insufficient documentation

## 2014-02-08 DIAGNOSIS — R7401 Elevation of levels of liver transaminase levels: Secondary | ICD-10-CM | POA: Diagnosis present

## 2014-02-08 LAB — COMPREHENSIVE METABOLIC PANEL
ALBUMIN: 3.5 g/dL (ref 3.5–5.2)
ALK PHOS: 95 U/L (ref 39–117)
ALT: 136 U/L — AB (ref 0–53)
AST: 206 U/L — AB (ref 0–37)
Anion gap: 24 — ABNORMAL HIGH (ref 5–15)
BILIRUBIN TOTAL: 1.4 mg/dL — AB (ref 0.3–1.2)
BUN: 42 mg/dL — ABNORMAL HIGH (ref 6–23)
CHLORIDE: 98 meq/L (ref 96–112)
CO2: 18 mEq/L — ABNORMAL LOW (ref 19–32)
Calcium: 8.1 mg/dL — ABNORMAL LOW (ref 8.4–10.5)
Creatinine, Ser: 2.6 mg/dL — ABNORMAL HIGH (ref 0.50–1.35)
GFR calc Af Amer: 24 mL/min — ABNORMAL LOW (ref >90)
GFR calc non Af Amer: 21 mL/min — ABNORMAL LOW (ref >90)
Glucose, Bld: 87 mg/dL (ref 70–99)
POTASSIUM: 4.8 meq/L (ref 3.7–5.3)
SODIUM: 140 meq/L (ref 137–147)
TOTAL PROTEIN: 6.5 g/dL (ref 6.0–8.3)

## 2014-02-08 LAB — CBC WITH DIFFERENTIAL/PLATELET
BASOS PCT: 0 % (ref 0–1)
Basophils Absolute: 0 10*3/uL (ref 0.0–0.1)
EOS ABS: 0 10*3/uL (ref 0.0–0.7)
Eosinophils Relative: 0 % (ref 0–5)
HCT: 42.3 % (ref 39.0–52.0)
HEMOGLOBIN: 14.1 g/dL (ref 13.0–17.0)
Lymphocytes Relative: 7 % — ABNORMAL LOW (ref 12–46)
Lymphs Abs: 0.6 10*3/uL — ABNORMAL LOW (ref 0.7–4.0)
MCH: 32.5 pg (ref 26.0–34.0)
MCHC: 33.3 g/dL (ref 30.0–36.0)
MCV: 97.5 fL (ref 78.0–100.0)
Monocytes Absolute: 1.2 10*3/uL — ABNORMAL HIGH (ref 0.1–1.0)
Monocytes Relative: 15 % — ABNORMAL HIGH (ref 3–12)
NEUTROS ABS: 6.7 10*3/uL (ref 1.7–7.7)
NEUTROS PCT: 78 % — AB (ref 43–77)
PLATELETS: 208 10*3/uL (ref 150–400)
RBC: 4.34 MIL/uL (ref 4.22–5.81)
RDW: 16.4 % — ABNORMAL HIGH (ref 11.5–15.5)
WBC: 8.5 10*3/uL (ref 4.0–10.5)

## 2014-02-08 LAB — ETHANOL: Alcohol, Ethyl (B): 27 mg/dL — ABNORMAL HIGH (ref 0–11)

## 2014-02-08 LAB — TROPONIN I: Troponin I: <0.3 ng/mL (ref <0.30)

## 2014-02-08 LAB — PRO B NATRIURETIC PEPTIDE: Pro B Natriuretic peptide (BNP): 62377 pg/mL — ABNORMAL HIGH (ref 0–450)

## 2014-02-08 MED ORDER — PNEUMOCOCCAL VAC POLYVALENT 25 MCG/0.5ML IJ INJ
0.5000 mL | INJECTION | INTRAMUSCULAR | Status: AC
  Administered 2014-02-09: 0.5 mL via INTRAMUSCULAR
  Filled 2014-02-08 (×3): qty 0.5

## 2014-02-08 MED ORDER — POTASSIUM CHLORIDE CRYS ER 10 MEQ PO TBCR
10.0000 meq | EXTENDED_RELEASE_TABLET | Freq: Every day | ORAL | Status: AC
  Administered 2014-02-09 – 2014-02-11 (×3): 10 meq via ORAL
  Filled 2014-02-08 (×3): qty 1

## 2014-02-08 MED ORDER — ENOXAPARIN SODIUM 30 MG/0.3ML ~~LOC~~ SOLN
30.0000 mg | SUBCUTANEOUS | Status: DC
  Administered 2014-02-08: 30 mg via SUBCUTANEOUS
  Filled 2014-02-08 (×2): qty 0.3

## 2014-02-08 MED ORDER — CETYLPYRIDINIUM CHLORIDE 0.05 % MT LIQD
7.0000 mL | Freq: Two times a day (BID) | OROMUCOSAL | Status: DC
  Administered 2014-02-08 – 2014-02-16 (×14): 7 mL via OROMUCOSAL

## 2014-02-08 MED ORDER — SODIUM CHLORIDE 0.9 % IJ SOLN: 3.0000 mL | INTRAMUSCULAR | Status: DC | PRN

## 2014-02-08 MED ORDER — OXYCODONE HCL 5 MG PO TABS
5.0000 mg | ORAL_TABLET | Freq: Four times a day (QID) | ORAL | Status: DC | PRN
  Administered 2014-02-15: 5 mg via ORAL
  Filled 2014-02-08: qty 1

## 2014-02-08 MED ORDER — FUROSEMIDE 10 MG/ML IJ SOLN
80.0000 mg | Freq: Two times a day (BID) | INTRAMUSCULAR | Status: AC
  Administered 2014-02-08 – 2014-02-10 (×4): 80 mg via INTRAVENOUS
  Filled 2014-02-08 (×4): qty 8

## 2014-02-08 MED ORDER — ONDANSETRON HCL 4 MG/2ML IJ SOLN: 4.0000 mg | Freq: Four times a day (QID) | INTRAMUSCULAR | Status: DC | PRN

## 2014-02-08 MED ORDER — CARVEDILOL 3.125 MG PO TABS
3.1250 mg | ORAL_TABLET | Freq: Two times a day (BID) | ORAL | Status: DC
  Administered 2014-02-09 (×2): 3.125 mg via ORAL
  Filled 2014-02-08 (×6): qty 1

## 2014-02-08 MED ORDER — FUROSEMIDE 10 MG/ML IJ SOLN
40.0000 mg | Freq: Once | INTRAMUSCULAR | Status: AC
  Administered 2014-02-08: 40 mg via INTRAVENOUS
  Filled 2014-02-08: qty 4

## 2014-02-08 MED ORDER — PANTOPRAZOLE SODIUM 40 MG PO TBEC: 40.0000 mg | DELAYED_RELEASE_TABLET | Freq: Every day | ORAL | Status: DC

## 2014-02-08 MED ORDER — INFLUENZA VAC SPLIT QUAD 0.5 ML IM SUSY
0.5000 mL | PREFILLED_SYRINGE | INTRAMUSCULAR | Status: AC
  Administered 2014-02-09: 0.5 mL via INTRAMUSCULAR
  Filled 2014-02-08 (×3): qty 0.5

## 2014-02-08 MED ORDER — SODIUM CHLORIDE 0.9 % IJ SOLN
3.0000 mL | Freq: Two times a day (BID) | INTRAMUSCULAR | Status: DC
  Administered 2014-02-08 – 2014-02-15 (×15): 3 mL via INTRAVENOUS

## 2014-02-08 MED ORDER — ACETAMINOPHEN 325 MG PO TABS: 650.0000 mg | ORAL_TABLET | ORAL | Status: DC | PRN

## 2014-02-08 MED ORDER — ZOLPIDEM TARTRATE 5 MG PO TABS
5.0000 mg | ORAL_TABLET | Freq: Every evening | ORAL | Status: DC | PRN
  Administered 2014-02-16: 5 mg via ORAL
  Filled 2014-02-08: qty 1

## 2014-02-08 MED ORDER — ASPIRIN EC 81 MG PO TBEC
81.0000 mg | DELAYED_RELEASE_TABLET | Freq: Every day | ORAL | Status: DC
  Administered 2014-02-08 – 2014-02-16 (×9): 81 mg via ORAL
  Filled 2014-02-08 (×9): qty 1

## 2014-02-08 MED ORDER — TAMSULOSIN HCL 0.4 MG PO CAPS: 0.4000 mg | ORAL_CAPSULE | Freq: Every day | ORAL | Status: DC

## 2014-02-08 MED ORDER — GABAPENTIN 100 MG PO CAPS: 100.0000 mg | ORAL_CAPSULE | Freq: Two times a day (BID) | ORAL | Status: DC

## 2014-02-08 MED ORDER — SODIUM CHLORIDE 0.9 % IV SOLN: 250.0000 mL | INTRAVENOUS | Status: DC | PRN

## 2014-02-08 NOTE — ED Notes (Signed)
Bed: WA07 Expected date:  Expected time:  Means of arrival: Ambulance Comments: 80 year old weakness

## 2014-02-08 NOTE — H&P (Signed)
[facility] Admission History and Physical       Jesse Allen JOI:[id number] DOB: [birth year] DOA: 02/08/2014  Referring physician: EDP PCP: No primary care provider on file.  Specialists:   Chief Complaint: SOB  HPI: Jesse Allen is a 80 y.o. male with history of CHF, HTN, Prostate Cancer, Arthritis and Gout who presents to the ED with complaints of worsening SOB and DOE and orthopnea x 6 weeks.  He reports that he takes Lasix once daily.    He also reports having increased pain in both of his hips and in his knees and has difficulty walking due to pain.   He reports that he has fallen twice within the past 2 months.   He was evaluated in the ED and was found to have a Pro BNP of 62377.0.      Review of Systems:  Constitutional: No Weight Loss, No Weight Gain, Night Sweats, Fevers, Chills, Dizziness, Fatigue, or Generalized Weakness HEENT: No Headaches, Difficulty Swallowing,Tooth/Dental Problems,Sore Throat,  No Sneezing, Rhinitis, Ear Ache, Nasal Congestion, or Post Nasal Drip,  Cardio-vascular:  No Chest pain, +Orthopnea, PND, +Edema in Lower Extremities, Anasarca, Dizziness, Palpitations  Resp:  +Dyspnea, +DOE, No Cough, No Hemoptysis, No Wheezing.    GI: No Heartburn, Indigestion, Abdominal Pain, Nausea, Vomiting, Diarrhea, Hematemesis, Hematochezia, Melena, Change in Bowel Habits,  Loss of Appetite  GU: No Dysuria, Change in Color of Urine, No Urgency or Frequency, No Flank pain.  Musculoskeletal: +Joint Pain or Swelling, No Decreased Range of Motion, No Back Pain.  Neurologic: No Syncope, No Seizures, Muscle Weakness, Paresthesia, Vision Disturbance or Loss, No Diplopia, No Vertigo, +Difficulty Walking,  Skin: No Rash or Lesions. Psych: No Change in Mood or Affect, No Depression or Anxiety, No Memory loss, No Confusion, or Hallucinations   Past Medical History  Diagnosis Date  . Gout   . Hypertension     History reviewed. No pertinent past surgical history.     Prior to Admission medications   Medication Sig Start Date End Date Taking? Authorizing Provider  aspirin EC 81 MG EC tablet Take 1 tablet (81 mg total) by mouth daily. 12/01/12  Yes Barton Dubois, MD  furosemide (LASIX) 20 MG tablet Take 1 tablet (20 mg total) by mouth daily with breakfast. Patient taking differently: Take 20 mg by mouth every other day.  12/01/12  Yes Barton Dubois, MD  amLODipine (NORVASC) 10 MG tablet Take 1 tablet (10 mg total) by mouth daily. Patient not taking: Reported on 02/08/2014 12/01/12   Barton Dubois, MD  gabapentin (NEURONTIN) 100 MG capsule Take 1 capsule (100 mg total) by mouth 2 (two) times daily. Patient not taking: Reported on 02/08/2014 12/01/12   Barton Dubois, MD  pantoprazole (PROTONIX) 40 MG tablet Take 1 tablet (40 mg total) by mouth daily at 12 noon. Patient not taking: Reported on 02/08/2014 12/01/12   Barton Dubois, MD  predniSONE (DELTASONE) 10 MG tablet Take 3 tablets (30 mg total) by mouth daily with breakfast. Patient not taking: Reported on 02/08/2014 12/01/12   Barton Dubois, MD  tamsulosin (FLOMAX) 0.4 MG CAPS capsule Take 1 capsule (0.4 mg total) by mouth daily after supper. Patient not taking: Reported on 02/08/2014 12/01/12   Barton Dubois, MD  traMADol (ULTRAM) 50 MG tablet Take 1 tablet (50 mg total) by mouth every 6 (six) hours as needed for pain. Patient not taking: Reported on 02/08/2014 12/01/12   Barton Dubois, MD      No Known Allergies   Social  History:  Widower x 3 Years, Lives Alone. Able to perform ADLs   reports that he has never smoked. He does not have any smokeless tobacco history on file. He reports that he drinks alcohol. He reports that he does not use illicit drugs.     No family history on file.     Physical Exam:  GEN:  Pleasant Elderly Obese  80 y.o. African Amercian male examined  and in no acute distress; cooperative with exam Filed Vitals:   02/08/14 1554 02/08/14 1620 02/08/14 1946  BP:  167/97  166/86  Pulse:  84 84  Temp:  98.3 F (36.8 C) 98.7 F (37.1 C)  TempSrc:  Oral Oral  Resp:  21 16  SpO2: 99% 100% 100%   Blood pressure 166/86, pulse 84, temperature 98.7 F (37.1 C), temperature source Oral, resp. rate 16, SpO2 100 %. PSYCH: He is alert and oriented x4; does not appear anxious does not appear depressed; affect is normal HEENT: Normocephalic and Atraumatic, Mucous membranes pink; PERRLA; EOM intact; Fundi:  Benign;  No scleral icterus, Nares: Patent, Oropharynx: Clear, Edentulous with Dentures Present,    Neck:  FROM, No Cervical Lymphadenopathy nor Thyromegaly or Carotid Bruit; No JVD; Breasts:: Not examined CHEST WALL: No tenderness CHEST: Normal respiration, clear to auscultation bilaterally HEART: Regular rate and rhythm; no murmurs rubs or gallops BACK: No kyphosis or scoliosis; No CVA tenderness ABDOMEN: Positive Bowel Sounds, Obese, Soft Non-Tender; No Masses, No Organomegaly Rectal Exam: Not done EXTREMITIES: No Cyanosis, No Clubbing, 2+BLE Edema Genitalia: not examined PULSES: 2+ and symmetric SKIN: Normal hydration no rash or ulceration CNS:  Alert and Oriented x 4, No Focal Deficits Generalized Weakness Vascular: pulses palpable throughout    Labs on Admission:  Basic Metabolic Panel:  Recent Labs Lab 02/08/14 1655  NA 140  K 4.8  CL 98  CO2 18*  GLUCOSE 87  BUN 42*  CREATININE 2.60*  CALCIUM 8.1*   Liver Function Tests:  Recent Labs Lab 02/08/14 1655  AST 206*  ALT 136*  ALKPHOS 95  BILITOT 1.4*  PROT 6.5  ALBUMIN 3.5   No results for input(s): LIPASE, AMYLASE in the last 168 hours. No results for input(s): AMMONIA in the last 168 hours. CBC:  Recent Labs Lab 02/08/14 1655  WBC 8.5  NEUTROABS 6.7  HGB 14.1  HCT 42.3  MCV 97.5  PLT 208   Cardiac Enzymes:  Recent Labs Lab 02/08/14 1655  TROPONINI <0.30    BNP (last 3 results)  Recent Labs  02/08/14 1655  PROBNP 62377.0*   CBG: No results for input(s):  GLUCAP in the last 168 hours.  Radiological Exams on Admission: Dg Chest 2 View  02/08/2014   CLINICAL DATA:  Weakness.  Shortness of breath.  EXAM: CHEST  2 VIEW  COMPARISON:  10/01/2009  FINDINGS: Low lung volumes are present, causing crowding of the pulmonary vasculature. Mildly enlarged cardiopericardial silhouette. Tortuous thoracic aorta. Mild airway thickening. Mildly thickened minor fissure.  IMPRESSION: 1. Airway thickening is present, suggesting bronchitis or reactive airways disease. 2. Enlarged cardiopericardial silhouette without overt edema, although there is some trace fluid in the minor fissure.   Electronically Signed   By: Sherryl Barters M.D.   On: 02/08/2014 17:41     EKG: Independently reviewed. Normal Sinus rhythm at 80   Assessment/Plan:   80 y.o. male with  Principal Problem:   1.   Acute systolic CHF (congestive heart failure)   CHF Protocol ordered with 80 mg  IV Lasix BID x 4 doses   Carvedilol Rx added i Active Problems:   2.   SOB (shortness of breath)   Due to #1     3.   AKI (acute kidney injury)   Due to #1   Monitor BUN/Cr     4.   Elevated transaminase level   Due to #1,  Hepatic Congestion   Monitor LFTs     5.    Essential hypertension   Lasix increased to 80 mg   Carvedilol BID ordered   Monitor BPs       6.   Arthritis involving multiple sites   Oxycodone QID PRN   Refer for Cortisone Injections to Orthopedics as Outpatient     7.   Gait abnormality   Physical Therapy evaluation     8.   DVT Prophylaxis   Lovenox      Code Status:    FULL CODE Family Communication:   No Family Present Disposition Plan:         Time spent:  [place] [facility] Pager [phone]   If [place] Please Contact the Day Rounding Team MD for [facility]  If 7PM-7AM, Please Contact Night-Floor Coverage  [email or web address] Password [facility] 02/08/2014, 8:47 PM

## 2014-02-08 NOTE — ED Notes (Signed)
Pt from home via EMS-Per EMS pt sts that he has been weak and SOB when ambulating x6 weeks and has been self medicating daily with 2 shots of bourbon for pain. Pt is non-compliant with HTN meds or lasix. Pt reports that he takes meds when needed.Pt denies CP, N/V/D, fever.  Pt is A&O x4 and in NAD

## 2014-02-08 NOTE — ED Notes (Signed)
Called floor to let them know patient will be up in 20 min. Talked to Vanduser.

## 2014-02-08 NOTE — ED Provider Notes (Signed)
CSN: [id number]     Arrival date & time 02/08/14  1552 History   First MD Initiated Contact with Patient 02/08/14 1632     Chief Complaint  Patient presents with  . Fatigue  . Shortness of Breath  . Hypertension     (Consider location/radiation/quality/duration/timing/severity/associated sxs/prior Treatment) HPI Comments: 80 year old male with history of high blood pressure, possible prostate cancer without current treatment, lives at home alone presents with generally weak and shortness of breath for the past 2 months. Patient's very vague with his history and says all his medical problems are due old age and he feels general body aches. Patient denies any significant lung or heart disease. No chest pain. Trace of breath is all the time gradually worsening, no orthopnea and unknown weight gain. Patient has swelling bilateral lower extremities unsure if worse than normal. Patient unsure if he is taking his Lasix and blood pressure medications. Patient denies fever or cough. Patient denies a blood clot history, active cancer, unilateral leg symptoms or hemoptysis. Patient denies home oxygen  Patient is a 80 y.o. male presenting with shortness of breath and hypertension. The history is provided by the patient.  Shortness of Breath Associated symptoms: no abdominal pain, no chest pain, no fever, no headaches, no neck pain, no rash and no vomiting   Hypertension Associated symptoms include shortness of breath. Pertinent negatives include no chest pain, no abdominal pain and no headaches.    Past Medical History  Diagnosis Date  . Gout   . Hypertension    History reviewed. No pertinent past surgical history. No family history on file. History  Substance Use Topics  . Smoking status: Never Smoker   . Smokeless tobacco: Not on file  . Alcohol Use: Yes     Comment: today- "a double shot" (occasional use)    Review of Systems  Constitutional: Positive for fatigue. Negative for fever and  chills.  HENT: Negative for congestion.   Eyes: Negative for visual disturbance.  Respiratory: Positive for shortness of breath.   Cardiovascular: Positive for leg swelling. Negative for chest pain.  Gastrointestinal: Negative for vomiting and abdominal pain.  Genitourinary: Negative for dysuria and flank pain.  Musculoskeletal: Negative for back pain, neck pain and neck stiffness.  Skin: Negative for rash.  Neurological: Negative for light-headedness and headaches.      Allergies  Review of patient's allergies indicates no known allergies.  Home Medications   Prior to Admission medications   Medication Sig Start Date End Date Taking? Authorizing Provider  aspirin EC 81 MG EC tablet Take 1 tablet (81 mg total) by mouth daily. 12/01/12  Yes Barton Dubois, MD  furosemide (LASIX) 20 MG tablet Take 1 tablet (20 mg total) by mouth daily with breakfast. Patient taking differently: Take 20 mg by mouth every other day.  12/01/12  Yes Barton Dubois, MD  amLODipine (NORVASC) 10 MG tablet Take 1 tablet (10 mg total) by mouth daily. Patient not taking: Reported on 02/08/2014 12/01/12   Barton Dubois, MD  gabapentin (NEURONTIN) 100 MG capsule Take 1 capsule (100 mg total) by mouth 2 (two) times daily. Patient not taking: Reported on 02/08/2014 12/01/12   Barton Dubois, MD  pantoprazole (PROTONIX) 40 MG tablet Take 1 tablet (40 mg total) by mouth daily at 12 noon. Patient not taking: Reported on 02/08/2014 12/01/12   Barton Dubois, MD  predniSONE (DELTASONE) 10 MG tablet Take 3 tablets (30 mg total) by mouth daily with breakfast. Patient not taking: Reported on 02/08/2014 12/01/12  Barton Dubois, MD  tamsulosin (FLOMAX) 0.4 MG CAPS capsule Take 1 capsule (0.4 mg total) by mouth daily after supper. Patient not taking: Reported on 02/08/2014 12/01/12   Barton Dubois, MD  traMADol (ULTRAM) 50 MG tablet Take 1 tablet (50 mg total) by mouth every 6 (six) hours as needed for pain. Patient not taking:  Reported on 02/08/2014 12/01/12   Barton Dubois, MD   BP 166/87 mmHg  Pulse 70  Temp(Src) 97.8 F (36.6 C) (Oral)  Resp 20  Ht 5\' 11"  (1.803 m)  Wt 242 lb 8 oz (109.997 kg)  BMI 33.84 kg/m2  SpO2 90% Physical Exam  Constitutional: He is oriented to person, place, and time. He appears well-developed and well-nourished.  HENT:  Head: Normocephalic and atraumatic.  Eyes: Right eye exhibits no discharge. Left eye exhibits no discharge.  Neck: Normal range of motion. Neck supple. No tracheal deviation present.  Cardiovascular: Normal rate and regular rhythm.   Pulmonary/Chest: Effort normal. He has rales (crackles at bases bilateral).  Abdominal: Soft. He exhibits no distension. There is no tenderness. There is no guarding.  Musculoskeletal: He exhibits edema (2+ bilateral lower extremities).  Neurological: He is alert and oriented to person, place, and time.  Skin: Skin is warm. No rash noted.  Psychiatric: He has a normal mood and affect.  Nursing note and vitals reviewed.   ED Course  Procedures (including critical care time)   EMERGENCY DEPARTMENT [place] CARDIAC EXAM "Study: Limited Ultrasound of the heart and pericardium"  INDICATIONS:Dyspnea Multiple views of the heart and pericardium were obtained in real-time with a multi-frequency probe.  PERFORMED WU:[id number]  IMAGES ARCHIVED?: Yes  FINDINGS: No pericardial effusion, Decreased contractility, IVC dilated and Tamponade physiology absent  LIMITATIONS:  Body habitus  VIEWS USED: Subcostal 4 chamber, Parasternal long axis, Parasternal short axis, Apical 4 chamber  and Inferior Vena Cava  INTERPRETATION: Cardiac activity present, Pericardial effusioin absent, Probable elevated CVP and Decreased contractility   Labs Review Labs Reviewed  CBC WITH DIFFERENTIAL - Abnormal; Notable for the following:    RDW 16.4 (*)    Neutrophils Relative % 78 (*)    Lymphocytes Relative 7 (*)    Lymphs Abs 0.6 (*)    Monocytes Relative 15  (*)    Monocytes Absolute 1.2 (*)    All other components within normal limits  COMPREHENSIVE METABOLIC PANEL - Abnormal; Notable for the following:    CO2 18 (*)    BUN 42 (*)    Creatinine, Ser 2.60 (*)    Calcium 8.1 (*)    AST 206 (*)    ALT 136 (*)    Total Bilirubin 1.4 (*)    GFR calc non Af Amer 21 (*)    GFR calc Af Amer 24 (*)    Anion gap 24 (*)    All other components within normal limits  PRO B NATRIURETIC PEPTIDE - Abnormal; Notable for the following:    Pro B Natriuretic peptide (BNP) 62377.0 (*)    All other components within normal limits  ETHANOL - Abnormal; Notable for the following:    Alcohol, Ethyl (B) 27 (*)    All other components within normal limits  TROPONIN I  BASIC METABOLIC PANEL    Imaging Review Dg Chest 2 View  02/08/2014   CLINICAL DATA:  Weakness.  Shortness of breath.  EXAM: CHEST  2 VIEW  COMPARISON:  10/01/2009  FINDINGS: Low lung volumes are present, causing crowding of the pulmonary vasculature. Mildly enlarged cardiopericardial  silhouette. Tortuous thoracic aorta. Mild airway thickening. Mildly thickened minor fissure.  IMPRESSION: 1. Airway thickening is present, suggesting bronchitis or reactive airways disease. 2. Enlarged cardiopericardial silhouette without overt edema, although there is some trace fluid in the minor fissure.   Electronically Signed   By: Sherryl Barters M.D.   On: 02/08/2014 17:41     EKG Interpretation None      MDM   Final diagnoses:  Dyspnea  Acute systolic CHF (congestive heart failure)  Essential hypertension  Alcohol abuse   Patient presents for general weakness and shortness of breath, if occult historian. With rales at the bases, high blood pressure concern for possible pulmonary edema or new heart failure versus pneumonia versus CAD versus other. Plan for cardiac workup and chest x-ray. Patient requiring nasal cannula oxygenation.  Patient requiring 3-4 L nasal cannula which is new for him. Chest  x-ray showed cardiomegaly no significant pulmonary edema. With high blood pressure, leg swelling, worsening dyspnea concern for congestive heart failure. Bedside ultrasound showed significant decrease in gross ejection fraction. Page [facility] for admission to telemetry. Lasix IV ordered. Patient asking for alcohol bourbon in particular, explained that he will not be over drink alcohol hospital.  The patients results and plan were reviewed and discussed.   Any x-rays performed were personally reviewed by myself.   Differential diagnosis were considered with the presenting HPI.  Medications  aspirin EC tablet 81 mg (81 mg Oral Given 02/08/14 2245)  sodium chloride 0.9 % injection 3 mL (3 mLs Intravenous Given 02/08/14 2245)  sodium chloride 0.9 % injection 3 mL (not administered)  0.9 %  sodium chloride infusion (not administered)  acetaminophen (TYLENOL) tablet 650 mg (not administered)  ondansetron (ZOFRAN) injection 4 mg (not administered)  enoxaparin (LOVENOX) injection 30 mg (30 mg Subcutaneous Given 02/08/14 2245)  furosemide (LASIX) injection 80 mg (80 mg Intravenous Given 02/08/14 2244)  zolpidem (AMBIEN) tablet 5 mg (not administered)  potassium chloride (K-DUR,KLOR-CON) CR tablet 10 mEq (not administered)  carvedilol (COREG) tablet 3.125 mg (not administered)  oxyCODONE (Oxy IR/ROXICODONE) immediate release tablet 5 mg (not administered)  Influenza vac split quadrivalent PF (FLUARIX) injection 0.5 mL (not administered)  pneumococcal 23 valent vaccine (PNU-IMMUNE) injection 0.5 mL (not administered)  antiseptic oral rinse (CPC / CETYLPYRIDINIUM CHLORIDE 0.05%) solution 7 mL (7 mLs Mouth Rinse Given 02/08/14 2215)  furosemide (LASIX) injection 40 mg (40 mg Intravenous Given 02/08/14 1944)    Filed Vitals:   02/08/14 1554 02/08/14 1620 02/08/14 1946 02/08/14 2145  BP:  167/97 166/86 166/87  Pulse:  84 84 70  Temp:  98.3 F (36.8 C) 98.7 F (37.1 C) 97.8 F (36.6 C)   TempSrc:  Oral Oral Oral  Resp:  21 16 20   Height:    5\' 11"  (1.803 m)  Weight:    242 lb 8 oz (109.997 kg)  SpO2: 99% 100% 100% 90%    Final diagnoses:  Dyspnea  Acute systolic CHF (congestive heart failure)  Essential hypertension  Alcohol abuse    Admission/ observation were discussed with the admitting physician, patient and/or family and they are comfortable with the plan.      Mariea Clonts, MD 02/08/14 [phone]

## 2014-02-08 NOTE — ED Notes (Signed)
Pt from home, reports that he lives alone and has been SOB and weak x6 weeks. Pt sts that he also ha had hematuria x1 year. Pt sts that a doctor told him that " I have a touch of cancer in my prostate." Pt has 4+ pitting edema to bilateral extremties and +1 edema to bilateral arms. Pt is oriented to self, place and situation, but not to time.

## 2014-02-08 NOTE — ED Notes (Signed)
Dr. Reather Converse made aware no social work till AM

## 2014-02-08 NOTE — ED Notes (Signed)
Attempted IV access w/o success. Charge RN notified and at bedside

## 2014-02-09 DIAGNOSIS — I059 Rheumatic mitral valve disease, unspecified: Secondary | ICD-10-CM

## 2014-02-09 DIAGNOSIS — I5023 Acute on chronic systolic (congestive) heart failure: Secondary | ICD-10-CM | POA: Diagnosis not present

## 2014-02-09 DIAGNOSIS — F10129 Alcohol abuse with intoxication, unspecified: Secondary | ICD-10-CM

## 2014-02-09 LAB — BASIC METABOLIC PANEL
Anion gap: 23 — ABNORMAL HIGH (ref 5–15)
BUN: 44 mg/dL — ABNORMAL HIGH (ref 6–23)
CHLORIDE: 102 meq/L (ref 96–112)
CO2: 19 meq/L (ref 19–32)
CREATININE: 2.52 mg/dL — AB (ref 0.50–1.35)
Calcium: 7.8 mg/dL — ABNORMAL LOW (ref 8.4–10.5)
GFR calc Af Amer: 25 mL/min — ABNORMAL LOW (ref >90)
GFR calc non Af Amer: 22 mL/min — ABNORMAL LOW (ref >90)
Glucose, Bld: 91 mg/dL (ref 70–99)
Potassium: 4.2 mEq/L (ref 3.7–5.3)
Sodium: 144 mEq/L (ref 137–147)

## 2014-02-09 LAB — HEMOGLOBIN A1C
HEMOGLOBIN A1C: 6.1 % — AB (ref <5.7)
Mean Plasma Glucose: 128 mg/dL — ABNORMAL HIGH (ref <117)

## 2014-02-09 LAB — VITAMIN B12: VITAMIN B 12: 759 pg/mL (ref 211–911)

## 2014-02-09 MED ORDER — LORAZEPAM 2 MG/ML IJ SOLN: 1.0000 mg | Freq: Four times a day (QID) | INTRAMUSCULAR | Status: AC | PRN

## 2014-02-09 MED ORDER — THIAMINE HCL 100 MG/ML IJ SOLN
100.0000 mg | Freq: Every day | INTRAMUSCULAR | Status: DC
  Filled 2014-02-09 (×8): qty 1

## 2014-02-09 MED ORDER — HEPARIN SODIUM (PORCINE) 5000 UNIT/ML IJ SOLN
5000.0000 [IU] | Freq: Three times a day (TID) | INTRAMUSCULAR | Status: DC
  Administered 2014-02-09 – 2014-02-16 (×21): 5000 [IU] via SUBCUTANEOUS
  Filled 2014-02-09 (×24): qty 1

## 2014-02-09 MED ORDER — LORAZEPAM 1 MG PO TABS: 1.0000 mg | ORAL_TABLET | Freq: Four times a day (QID) | ORAL | Status: AC | PRN

## 2014-02-09 MED ORDER — VITAMIN B-1 100 MG PO TABS
100.0000 mg | ORAL_TABLET | Freq: Every day | ORAL | Status: DC
  Administered 2014-02-09 – 2014-02-16 (×8): 100 mg via ORAL
  Filled 2014-02-09 (×8): qty 1

## 2014-02-09 MED ORDER — ADULT MULTIVITAMIN W/MINERALS CH
1.0000 | ORAL_TABLET | Freq: Every day | ORAL | Status: DC
  Administered 2014-02-09 – 2014-02-16 (×8): 1 via ORAL
  Filled 2014-02-09 (×8): qty 1

## 2014-02-09 MED ORDER — FOLIC ACID 1 MG PO TABS
1.0000 mg | ORAL_TABLET | Freq: Every day | ORAL | Status: DC
  Administered 2014-02-09 – 2014-02-16 (×8): 1 mg via ORAL
  Filled 2014-02-09 (×8): qty 1

## 2014-02-09 NOTE — Progress Notes (Signed)
Echocardiogram 2D Echocardiogram has been performed.  Jesse Allen 02/09/2014, 1:57 PM

## 2014-02-09 NOTE — Plan of Care (Signed)
Problem: Phase I Progression Outcomes Goal: Dyspnea controlled at rest (HF) Outcome: Progressing

## 2014-02-09 NOTE — Progress Notes (Signed)
Clinical Social Work Department BRIEF PSYCHOSOCIAL ASSESSMENT 02/09/2014  Patient:  Jesse Allen, Jesse Allen     Account Number:  [id number]     Admit date:  02/08/2014  Clinical Social Worker:  Renold Genta  Date/Time:  02/09/2014 04:09 PM  Referred by:  Physician  Date Referred:  02/09/2014 Referred for  SNF Placement   Other Referral:   Interview type:  Patient Other interview type:    PSYCHOSOCIAL DATA Living Status:  ALONE Admitted from facility:   Level of care:   Primary support name:  Candi Leash (daughter) h#: [phone] Primary support relationship to patient:  CHILD, ADULT Degree of support available:   good    CURRENT CONCERNS Current Concerns  Post-Acute Placement   Other Concerns:    SOCIAL WORK ASSESSMENT / PLAN CSW received consult for SNF placement.   Assessment/plan status:  Information/Referral to [employer] Other assessment/ plan:   Information/referral to community resources:   CSW completed FL2 and faxed information out to [facility] - will provide bed offers when available.    PATIENT'S/FAMILY'S RESPONSE TO PLAN OF CARE: Patient informed CSW that he would prefer to go home at discharge, but would be willing to consider SNF - would like to talk it over with his daughter, Hoyle Sauer. CSW left message for Hoyle Sauer - awaiting call back & will provide bed offers.       Raynaldo Opitz, [facility] Clinical Social Worker cell #: [phone]

## 2014-02-09 NOTE — Evaluation (Signed)
Physical Therapy Evaluation Patient Details Name: Jesse Allen MRN: [id number] DOB: 1926-03-10 Today's Date: 02/09/2014   History of Present Illness  Pt is an 80 y.o. male with history of CHF, HTN, Prostate Cancer, Arthritis and Gout admitted 02/08/14 for SOB and weakness, diagnosed with CHF.  Clinical Impression  Pt currently with functional limitations due to the deficits listed below (see PT Problem List).  Pt will benefit from skilled PT to increase their independence and safety with mobility to allow discharge to the venue listed below. Pt required increased time and effort for all mobility due to bil hip and knee pain as well as SOB with any exertion.  Pt reports receiving cortisone injection to hip a couple months ago and reported this helped with pain however was told his MD left the practice.  Pt reports his older neighbor has been helping him with bed mobility and after he fell at home the last couple days prior to admission.  Recommend d/c to SNF at this time.     Follow Up Recommendations SNF    Equipment Recommendations  None recommended by PT    Recommendations for Other Services       Precautions / Restrictions Precautions Precautions: Fall Restrictions Weight Bearing Restrictions: No      Mobility  Bed Mobility Overal bed mobility: Needs Assistance Bed Mobility: Supine to Sit     Supine to sit: Min assist;HOB elevated     General bed mobility comments: increased time and effort, utilized bed pad to scoot EOB  Transfers Overall transfer level: Needs assistance Equipment used: Rolling walker (2 wheeled) Transfers: Sit to/from Stand Sit to Stand: Mod assist;+2 physical assistance;From elevated surface         General transfer comment: attempted from lower surface at first however pt unable, elevated bed and pt required mod assist to stand with cues for full extension  Ambulation/Gait Ambulation/Gait assistance: Min assist Ambulation Distance (Feet): 15  Feet Assistive device: Rolling walker (2 wheeled) Gait Pattern/deviations: Step-through pattern;Decreased stride length;Trunk flexed     General Gait Details: verbal cues RW distance, posture, pt reports increased weakness and SOB 3/4 dyspnea requiring recliner brought behind pt, SpO2 93% room air upon sitting however lowered to 86% so pt taken back to room and 3LO2 [place] reapplied.  Stairs            Wheelchair Mobility    Modified Rankin (Stroke Patients Only)       Balance Overall balance assessment: History of Falls                                           Pertinent Vitals/Pain Pain Assessment: 0-10 Pain Score: 8  Pain Location: knees during gait Pain Descriptors / Indicators: Aching Pain Intervention(s): Limited activity within patient's tolerance;Monitored during session;Repositioned  SpO2 on 3L O2 [place] 100% SpO2 97% on room air SpO2 93% room air however dropped to 86% upon sitting in recliner after ambulation so reapplied 3L O2 [place].    Home Living Family/patient expects to be discharged to:: Private residence Living Arrangements: Alone   Type of Home: House       Home Layout: One level Home Equipment: Walker - 2 wheels      Prior Function Level of Independence: Independent with assistive device(s)               Hand Dominance  Extremity/Trunk Assessment               Lower Extremity Assessment: Generalized weakness;RLE deficits/detail;LLE deficits/detail         Communication   Communication: No difficulties  Cognition Arousal/Alertness: Awake/alert Behavior During Therapy: WFL for tasks assessed/performed Overall Cognitive Status: Within Functional Limits for tasks assessed                      General Comments      Exercises        Assessment/Plan    PT Assessment Patient needs continued PT services  PT Diagnosis Difficulty walking;Generalized weakness   PT Problem List Decreased  strength;Decreased mobility;Decreased activity tolerance  PT Treatment Interventions DME instruction;Gait training;Functional mobility training;Patient/family education;Therapeutic activities;Therapeutic exercise   PT Goals (Current goals can be found in the Care Plan section) Acute Rehab PT Goals PT Goal Formulation: With patient Time For Goal Achievement: 02/23/14 Potential to Achieve Goals: Good    Frequency Min 3X/week   Barriers to discharge        Co-evaluation               End of Session Equipment Utilized During Treatment: Gait belt;Oxygen Activity Tolerance: Patient limited by pain;Patient limited by fatigue Patient left: in chair;with call bell/phone within reach Nurse Communication: Mobility status (pt up in recliner)         Time: 3267-1245 PT Time Calculation (min) (ACUTE ONLY): 26 min   Charges:   PT Evaluation $Initial PT Evaluation Tier I: 1 Procedure PT Treatments $Gait Training: 8-22 mins $Therapeutic Activity: 8-22 mins   PT G Codes:          Keirston Saephanh,KATHrine E 02/09/2014, 10:41 AM Carmelia Bake, PT, DPT 02/09/2014 Pager: [phone]

## 2014-02-09 NOTE — Progress Notes (Signed)
PROGRESS NOTE  Jesse Allen BZJ:[id number] DOB: 13-Apr-1925 DOA: 02/08/2014 PCP: No primary care provider on file.  HPI: Jesse Allen is a 80 y.o. male with history of CHF, HTN, Prostate Cancer, Arthritis and Gout who presents to the ED with complaints of worsening SOB and DOE and orthopnea x 6 weeks. He reports that he takes Lasix once daily. He also reports having increased pain in both of his hips and in his knees and has difficulty walking due to pain. He reports that he has fallen twice within the past 2 months. He was evaluated in the ED and was found to have a Pro BNP of 62377.0.  Subjective/ 24 H Interval events Complains of knee pain and bilateral hip pain  Assessment/Plan: Principal Problem:   Acute systolic CHF (congestive heart failure) Active Problems:   Gait abnormality   SOB (shortness of breath)   Essential hypertension   AKI (acute kidney injury)   Arthritis involving multiple sites   Elevated transaminase level   Acute on chronic systolic CHF (congestive heart failure) - most recent 2D echo was in 2011 with EF 40-45%, repeat echo pending now - he is not following regularly with MDs and lives by himself - will continue IV Lasix, patient with evidence of fluid overload, daily weights, strict I&Os  SOB (shortness of breath) - due to #1, IV Lasix  AKI (acute kidney injury) on probable chronic renal disease - most recent creatinine normal in 2014 - endorses using NSAIDs for his arthritis  - avoid ACEI  Elevated transaminase level - in a pattern of ETOH use, monitor  Essential hypertension - Lasix, Carvedilol  Arthritis involving multiple sites with gait abnormalities - Oxycodone QID PRN - PT consult  Alcohol abuse - patient drinks daily, when asked what he uses for pain for his arthritis, states "alcohol". ETOH elevated on admission - start CIWA - reports neuropathy, check B12 and folate levels   Diet: heart Fluids: none  DVT Prophylaxis:  heparin  Code Status: Full Family Communication: none  Disposition Plan: inpatient  Consultants:  None   Procedures:  2D echo pending   Antibiotics  Anti-infectives    None       Studies  Dg Chest 2 View  02/08/2014   CLINICAL DATA:  Weakness.  Shortness of breath.  EXAM: CHEST  2 VIEW  COMPARISON:  10/01/2009  FINDINGS: Low lung volumes are present, causing crowding of the pulmonary vasculature. Mildly enlarged cardiopericardial silhouette. Tortuous thoracic aorta. Mild airway thickening. Mildly thickened minor fissure.  IMPRESSION: 1. Airway thickening is present, suggesting bronchitis or reactive airways disease. 2. Enlarged cardiopericardial silhouette without overt edema, although there is some trace fluid in the minor fissure.   Electronically Signed   By: Sherryl Barters M.D.   On: 02/08/2014 17:41     Objective  Filed Vitals:   02/08/14 1620 02/08/14 1946 02/08/14 2145 02/09/14 0529  BP: 167/97 166/86 166/87 175/93  Pulse: 84 84 70 47  Temp: 98.3 F (36.8 C) 98.7 F (37.1 C) 97.8 F (36.6 C) 97.5 F (36.4 C)  TempSrc: Oral Oral Oral Oral  Resp: 21 16 20 20   Height:   5\' 11"  (1.803 m)   Weight:   109.997 kg (242 lb 8 oz) 113.399 kg (250 lb)  SpO2: 100% 100% 90% 100%    Intake/Output Summary (Last 24 hours) at 02/09/14 0717 Last data filed at 02/09/14 0530  Gross per 24 hour  Intake      0 ml  Output    400 ml  Net   -400 ml   Filed Weights   02/08/14 2145 02/09/14 0529  Weight: 109.997 kg (242 lb 8 oz) 113.399 kg (250 lb)    Exam:  General:  NAD  Cardiovascular: RRR  Respiratory: CTA biL  Abdomen: soft, non tender  MSK: 2+ pitting edema, chronic venous stasis changes bilateral LE  Neuro: non focal  Data Reviewed: Basic Metabolic Panel:  Recent Labs Lab 02/08/14 1655 02/09/14 0505  NA 140 144  K 4.8 4.2  CL 98 102  CO2 18* 19  GLUCOSE 87 91  BUN 42* 44*  CREATININE 2.60* 2.52*  CALCIUM 8.1* 7.8*   Liver Function  Tests:  Recent Labs Lab 02/08/14 1655  AST 206*  ALT 136*  ALKPHOS 95  BILITOT 1.4*  PROT 6.5  ALBUMIN 3.5   CBC:  Recent Labs Lab 02/08/14 1655  WBC 8.5  NEUTROABS 6.7  HGB 14.1  HCT 42.3  MCV 97.5  PLT 208   Cardiac Enzymes:  Recent Labs Lab 02/08/14 1655  TROPONINI <0.30   BNP (last 3 results)  Recent Labs  02/08/14 1655  PROBNP 62377.0*     Scheduled Meds: . antiseptic oral rinse  7 mL Mouth Rinse BID  . aspirin EC  81 mg Oral Daily  . carvedilol  3.125 mg Oral BID WC  . enoxaparin (LOVENOX) injection  30 mg Subcutaneous Q24H  . furosemide  80 mg Intravenous Q12H  . Influenza vac split quadrivalent PF  0.5 mL Intramuscular Tomorrow-1000  . pneumococcal 23 valent vaccine  0.5 mL Intramuscular Tomorrow-1000  . potassium chloride  10 mEq Oral Daily  . sodium chloride  3 mL Intravenous Q12H   Continuous Infusions:   Time spent: 35 minutes  Marzetta Board, MD [facility] Pager [phone]. If 7 PM - 7 AM, please contact night-coverage at [email or web address], password [facility] 02/09/2014, 7:17 AM  LOS: 1 day

## 2014-02-09 NOTE — Plan of Care (Signed)
Problem: Phase I Progression Outcomes Goal: Pain controlled with appropriate interventions Outcome: Completed/Met Date Met:  02/09/14     

## 2014-02-09 NOTE — Plan of Care (Signed)
Problem: Phase I Progression Outcomes Goal: Pain controlled with appropriate interventions Outcome: Completed/Met Date Met:  02/09/14 Goal: OOB as tolerated unless otherwise ordered Outcome: Progressing Goal: Initial discharge plan identified Outcome: Progressing Goal: Voiding-avoid urinary catheter unless indicated Outcome: Completed/Met Date Met:  02/09/14 Goal: Hemodynamically stable Outcome: Progressing Goal: Other Phase I Outcomes/Goals Outcome: Progressing

## 2014-02-10 DIAGNOSIS — R0600 Dyspnea, unspecified: Secondary | ICD-10-CM | POA: Insufficient documentation

## 2014-02-10 LAB — CBC
HEMATOCRIT: 38.8 % — AB (ref 39.0–52.0)
HEMOGLOBIN: 12.8 g/dL — AB (ref 13.0–17.0)
MCH: 32.2 pg (ref 26.0–34.0)
MCHC: 33 g/dL (ref 30.0–36.0)
MCV: 97.5 fL (ref 78.0–100.0)
Platelets: 189 10*3/uL (ref 150–400)
RBC: 3.98 MIL/uL — ABNORMAL LOW (ref 4.22–5.81)
RDW: 16.5 % — ABNORMAL HIGH (ref 11.5–15.5)
WBC: 6.4 10*3/uL (ref 4.0–10.5)

## 2014-02-10 LAB — COMPREHENSIVE METABOLIC PANEL
ALBUMIN: 3 g/dL — AB (ref 3.5–5.2)
ALT: 91 U/L — AB (ref 0–53)
AST: 64 U/L — AB (ref 0–37)
Alkaline Phosphatase: 94 U/L (ref 39–117)
Anion gap: 18 — ABNORMAL HIGH (ref 5–15)
BUN: 54 mg/dL — ABNORMAL HIGH (ref 6–23)
CO2: 22 mEq/L (ref 19–32)
Calcium: 7.5 mg/dL — ABNORMAL LOW (ref 8.4–10.5)
Chloride: 98 mEq/L (ref 96–112)
Creatinine, Ser: 2.52 mg/dL — ABNORMAL HIGH (ref 0.50–1.35)
GFR calc Af Amer: 25 mL/min — ABNORMAL LOW (ref >90)
GFR calc non Af Amer: 22 mL/min — ABNORMAL LOW (ref >90)
Glucose, Bld: 140 mg/dL — ABNORMAL HIGH (ref 70–99)
POTASSIUM: 4.1 meq/L (ref 3.7–5.3)
SODIUM: 138 meq/L (ref 137–147)
TOTAL PROTEIN: 5.7 g/dL — AB (ref 6.0–8.3)
Total Bilirubin: 1 mg/dL (ref 0.3–1.2)

## 2014-02-10 LAB — FOLATE RBC: RBC Folate: 946 ng/mL — ABNORMAL HIGH (ref >280)

## 2014-02-10 NOTE — Consult Note (Signed)
Reason for Consult: Congestive heart failure  Requesting Physician: Dr Cruzita Lederer  HPI: Mr. Jesse Allen is an 80 year old severely overweight widowed African-American male with no children who was admitted on 02/08/14 with increasing shortness of breath and volume overload consistent with congestive heart failure. He saw Dr. Debara Pickett in the past. He currently lives alone and takes care of himself. His past history is notable for hypertension, hyperlipidemia, remote tobacco abuse, current ethanol abuse and renal insufficiency. He has had admissions for volume overload requiring IV diuresis. This 2-D echo performed this admission revealed an ejection fraction of 35% with biatrial enlargement and left ventricular hypertrophy. He was on furosemide 20 mg a day which she took randomly at home. His BNP on admission was 62,377. He has had a low risk Myoview back in 2008 and has no documented evidence of coronary artery disease. On admission he has diuresed approximately 2 L on 80 mg of Lasix IV twice a day. His serum creatinine has remained stable in the mid-2 range.    Patient Active Problem List   Diagnosis Date Noted  . Acute CHF 02/08/2014  . Acute systolic CHF (congestive heart failure) 02/08/2014  . SOB (shortness of breath) 02/08/2014  . Essential hypertension 02/08/2014  . AKI (acute kidney injury) 02/08/2014  . Arthritis involving multiple sites 02/08/2014  . Elevated transaminase level 02/08/2014  . Gait abnormality 11/29/2012  . Elevated CPK 11/29/2012  . Musculoskeletal thigh pain 11/29/2012  . Hematuria 11/29/2012  . Gout     PMHx:  Past Medical History  Diagnosis Date  . Gout   . Hypertension    History reviewed. No pertinent past surgical history.  FAMHx: No family history on file.  SOCHx:  reports that he has never smoked. He does not have any smokeless tobacco history on file. He reports that he drinks alcohol. He reports that he does not use illicit  drugs.  ALLERGIES: No Known Allergies  ROS: Pertinent items are noted in HPI.  HOME MEDICATIONS: Prescriptions prior to admission  Medication Sig Dispense Refill Last Dose  . aspirin EC 81 MG EC tablet Take 1 tablet (81 mg total) by mouth daily.   Past Week at Unknown time  . furosemide (LASIX) 20 MG tablet Take 1 tablet (20 mg total) by mouth daily with breakfast. (Patient taking differently: Take 20 mg by mouth every other day. ) 30 tablet 1 02/07/2014 at Unknown time  . amLODipine (NORVASC) 10 MG tablet Take 1 tablet (10 mg total) by mouth daily. (Patient not taking: Reported on 02/08/2014) 30 tablet 1   . gabapentin (NEURONTIN) 100 MG capsule Take 1 capsule (100 mg total) by mouth 2 (two) times daily. (Patient not taking: Reported on 02/08/2014) 60 capsule 1   . pantoprazole (PROTONIX) 40 MG tablet Take 1 tablet (40 mg total) by mouth daily at 12 noon. (Patient not taking: Reported on 02/08/2014) 30 tablet 1   . predniSONE (DELTASONE) 10 MG tablet Take 3 tablets (30 mg total) by mouth daily with breakfast. (Patient not taking: Reported on 02/08/2014) 21 tablet 0   . tamsulosin (FLOMAX) 0.4 MG CAPS capsule Take 1 capsule (0.4 mg total) by mouth daily after supper. (Patient not taking: Reported on 02/08/2014) 30 capsule 1   . traMADol (ULTRAM) 50 MG tablet Take 1 tablet (50 mg total) by mouth every 6 (six) hours as needed for pain. (Patient not taking: Reported on 02/08/2014) 40 tablet 0     HOSPITAL MEDICATIONS: I have reviewed the patient's current  medications.  VITALS: Blood pressure 147/71, pulse 48, temperature 97.5 F (36.4 C), temperature source Axillary, resp. rate 16, height 5\' 11"  (1.803 m), weight 245 lb 14.4 oz (111.54 kg), SpO2 100 %.  INPUT/OUTPUT I/O last 3 completed shifts: In: 483 [P.O.:480; I.V.:3] Out: 1675 [Urine:1675] Total I/O In: 3 [I.V.:3] Out: -     PHYSICAL EXAM: General appearance: alert and no distress Neck: no adenopathy, no carotid bruit, no  JVD, supple, symmetrical, trachea midline and thyroid not enlarged, symmetric, no tenderness/mass/nodules Lungs: clear to auscultation bilaterally Heart: regular rate and rhythm, S1, S2 normal, no murmur, click, rub or gallop Abdomen: soft, non-tender; bowel sounds normal; no masses,  no organomegaly Extremities: 1+ pitting edema with venous stasis changes  LABS:  BMP  Recent Labs  02/08/14 1655 02/09/14 0505 02/10/14 0500  NA 140 144 138  K 4.8 4.2 4.1  CL 98 102 98  CO2 18* 19 22  GLUCOSE 87 91 140*  BUN 42* 44* 54*  CREATININE 2.60* 2.52* 2.52*  CALCIUM 8.1* 7.8* 7.5*  GFRNONAA 21* 22* 22*  GFRAA 24* 25* 25*    CBC  Recent Labs Lab 02/10/14 0500  WBC 6.4  RBC 3.98*  HGB 12.8*  HCT 38.8*  PLT 189  MCV 97.5    HEMOGLOBIN A1C Lab Results  Component Value Date   HGBA1C 6.1* 02/09/2014   MPG 128* 02/09/2014    Cardiac Panel (last 3 results)  Recent Labs  02/08/14 1655  TROPONINI <0.30    BNP (last 3 results)  Recent Labs  02/08/14 1655  PROBNP 62377.0*    TSH No results for input(s): TSH in the last 8760 hours.  CHOLESTEROL No results for input(s): CHOL in the last 8760 hours.  Hepatic Function Panel  Recent Labs  02/08/14 1655 02/10/14 0500  PROT 6.5 5.7*  ALBUMIN 3.5 3.0*  AST 206* 64*  ALT 136* 91*  ALKPHOS 95 94  BILITOT 1.4* 1.0    IMAGING: Dg Chest 2 View  02/08/2014   CLINICAL DATA:  Weakness.  Shortness of breath.  EXAM: CHEST  2 VIEW  COMPARISON:  10/01/2009  FINDINGS: Low lung volumes are present, causing crowding of the pulmonary vasculature. Mildly enlarged cardiopericardial silhouette. Tortuous thoracic aorta. Mild airway thickening. Mildly thickened minor fissure.  IMPRESSION: 1. Airway thickening is present, suggesting bronchitis or reactive airways disease. 2. Enlarged cardiopericardial silhouette without overt edema, although there is some trace fluid in the minor fissure.   Electronically Signed   By: Sherryl Barters M.D.   On: 02/08/2014 17:41    Tele- NSR   IMPRESSION: 1. Systolic heart failure: Patient has had a long history of congestive heart failure dating back several years when he saw Dr. Debara Pickett in the office at which time his ejection fraction was normal. He does have a long history of hypertension as well. His CHF was thought to be related to hypertensive heart disease and diastolic dysfunction at that time. Currently his ejection fraction is 35% with mild left ventricular enlargement, severe biatrial enlargement and moderate to severe mitral regurgitation. His creatinine is in the mid 2 range making treatment with an ACE inhibitor or ARB problematic. In addition, he lives alone, drinks alcohol and cooks for himself probably making salt restriction unrealistic.   RECOMMENDATION: 1. He is currently on Lasix 80 mg IV twice a day and his -2 L. His renal function has remained stable. His peripheral edema appears to have improved somewhat since she's been here on the clinically he  remains weak. I recommend transitioning him to Lasix 80 mg by mouth daily as well as low-dose carvedilol 3.125 mg by mouth twice a day. I suspect we should get social service consult and/or case management to discuss the possibility of placement in a skilled nursing facility. I have broached this possibility with the patient. He will need close outpatient follow-up. Since he has seen Dr. Debara Pickett in the past the patient can follow-up with Dr. Debara Pickett after discharge in our [facility] line office.  Time Spent Directly with Patient: 30 minutes  Vantasia Pinkney J 02/10/2014, 12:49 PM

## 2014-02-10 NOTE — Plan of Care (Signed)
Problem: Phase I Progression Outcomes Goal: Hemodynamically stable Outcome: Progressing  Problem: Phase II Progression Outcomes Goal: Vital signs remain stable Outcome: Progressing Goal: IV changed to normal saline lock Outcome: Completed/Met Date Met:  02/10/14

## 2014-02-10 NOTE — Progress Notes (Signed)
Pharmacist Heart Failure Core Measure Documentation  Assessment: Jesse Allen has an EF documented as 35% on 02/09/2014 by TTE.  Rationale: Heart failure patients with left ventricular systolic dysfunction (LVSD) and an EF < 40% should be prescribed an angiotensin converting enzyme inhibitor (ACEI) or angiotensin receptor blocker (ARB) at discharge unless a contraindication is documented in the medical record.  This patient is not currently on an ACEI or ARB for HF.  This note is being placed in the record in order to provide documentation that a contraindication to the use of these agents is present for this encounter.  ACE Inhibitor or Angiotensin Receptor Blocker is contraindicated (specify all that apply)  []   ACEI allergy AND ARB allergy []   Angioedema []   Moderate or severe aortic stenosis []   Hyperkalemia []   Hypotension []   Renal artery stenosis [x]   Worsening renal function, preexisting renal disease or dysfunction   [name] 02/10/2014 1:53 PM

## 2014-02-10 NOTE — Progress Notes (Signed)
PROGRESS NOTE  MARTAVIS GURNEY BTD:[id number] DOB: 1925/11/01 DOA: 02/08/2014 PCP: No primary care provider on file.  HPI: Jesse Allen is a 80 y.o. male with history of CHF, HTN, Prostate Cancer, Arthritis and Gout who presents to the ED with complaints of worsening SOB and DOE and orthopnea x 6 weeks. He reports that he takes Lasix once daily. He also reports having increased pain in both of his hips and in his knees and has difficulty walking due to pain. He reports that he has fallen twice within the past 2 months. He was evaluated in the ED and was found to have a [place] BNP of 62377.0.  Subjective/ 24 H Interval events - Endorses feeling weak this morning, happy that she was able to work with the physical therapist yesterday  Assessment/Plan: Principal Problem:   Acute systolic CHF (congestive heart failure) Active Problems:   Gait abnormality   SOB (shortness of breath)   Essential hypertension   AKI (acute kidney injury)   Arthritis involving multiple sites   Elevated transaminase level   Acute on chronic systolic CHF (congestive heart failure) - most recent 2D echo was in 2011 with EF 40-45%, repeat echo yesterday done showed an ejection fraction of 35%  - he is not following regularly with MDs and lives by himself - will continue IV Lasix, patient with evidence of fluid overload, daily weights, strict I&Os - I have consulted cardiology today, appreciate input  - Somewhat bradycardic, discontinue Coreg   SOB (shortness of breath) - due to #1, IV Lasix - His breathing has improved with diuresis   AKI (acute kidney injury) on probable chronic renal disease - most recent creatinine normal in 2014 - endorses using NSAIDs for his arthritis  - avoid ACEI - His renal function is stable with ongoing diuresis, continue to monitor   Elevated transaminase level - in a pattern of ETOH use, monitor, improving   Essential hypertension - Lasix, Carvedilol  Arthritis  involving multiple sites with gait abnormalities - Oxycodone QID PRN - PT consult  Alcohol abuse - patient drinks daily, when asked what he uses for pain for his arthritis, states "alcohol". ETOH elevated on admission - started CIWA, not triggering as much - reports neuropathy, B12 normal   Diet: heart Fluids: none  DVT Prophylaxis: heparin  Code Status: Full Family Communication: none  Disposition Plan: inpatient, SNF when ready, social work consulted   Consultants:  Cardiology  Procedures:  2D echo pending - Left ventricle: Akinesis inferior wall. Severe hypokinesis inferolateral wall. Moderate hypokinesis anterolateral wall. The cavity size was mildly dilated. Wall thickness was increased in a pattern of mild LVH. The estimated ejection fraction was 35%.  - Aortic valve: Sclerosis without stenosis. There was mild regurgitation. - Aorta: Mild root dilitation. Aortic root dimension: 41 mm (ED). - Mitral valve: Mildly thickened leaflets . There was moderate to severe regurgitation. - Left atrium: The atrium was moderately to severely dilated. - Right ventricle: The cavity size was moderately dilated. Systolic function was moderately reduced. - Right atrium: The atrium was moderately to severely dilated. - Tricuspid valve: There was moderate-severe regurgitation. - Pulmonary arteries: PA peak pressure: 65 mm Hg (S).    Antibiotics None    Studies  No results found.   Objective  Filed Vitals:   02/09/14 1400 02/09/14 1739 02/09/14 2101 02/10/14 0517  BP: 151/74 146/85 131/71 147/71  Pulse: 62 62 61 48  Temp: 97.5 F (36.4 C)  97.7 F (36.5 C)  97.5 F (36.4 C)  TempSrc: Oral  Oral Axillary  Resp: 18  20 16   Height:      Weight:    111.54 kg (245 lb 14.4 oz)  SpO2: 100%  100% 100%    Intake/Output Summary (Last 24 hours) at 02/10/14 0710 Last data filed at 02/10/14 0700  Gross per 24 hour  Intake    483 ml  Output   1275 ml  Net   -792 ml   Filed  Weights   02/08/14 2145 02/09/14 0529 02/10/14 0517  Weight: 109.997 kg (242 lb 8 oz) 113.399 kg (250 lb) 111.54 kg (245 lb 14.4 oz)    Exam:  General:  NAD  Cardiovascular: RRR  Respiratory: CTA biL  Abdomen: soft, non tender  MSK: 2+ pitting edema, chronic venous stasis changes bilateral LE  Neuro: non focal  Data Reviewed: Basic Metabolic Panel:  Recent Labs Lab 02/08/14 1655 02/09/14 0505 02/10/14 0500  NA 140 144 138  K 4.8 4.2 4.1  CL 98 102 98  CO2 18* 19 22  GLUCOSE 87 91 140*  BUN 42* 44* 54*  CREATININE 2.60* 2.52* 2.52*  CALCIUM 8.1* 7.8* 7.5*   Liver Function Tests:  Recent Labs Lab 02/08/14 1655 02/10/14 0500  AST 206* 64*  ALT 136* 91*  ALKPHOS 95 94  BILITOT 1.4* 1.0  PROT 6.5 5.7*  ALBUMIN 3.5 3.0*   CBC:  Recent Labs Lab 02/08/14 1655 02/10/14 0500  WBC 8.5 6.4  NEUTROABS 6.7  --   HGB 14.1 12.8*  HCT 42.3 38.8*  MCV 97.5 97.5  PLT 208 189   Cardiac Enzymes:  Recent Labs Lab 02/08/14 1655  TROPONINI <0.30   BNP (last 3 results)  Recent Labs  02/08/14 1655  PROBNP 62377.0*     Scheduled Meds: . antiseptic oral rinse  7 mL Mouth Rinse BID  . aspirin EC  81 mg Oral Daily  . carvedilol  3.125 mg Oral BID WC  . folic acid  1 mg Oral Daily  . furosemide  80 mg Intravenous Q12H  . heparin subcutaneous  5,000 Units Subcutaneous 3 times per day  . multivitamin with minerals  1 tablet Oral Daily  . potassium chloride  10 mEq Oral Daily  . sodium chloride  3 mL Intravenous Q12H  . thiamine  100 mg Oral Daily   Or  . thiamine  100 mg Intravenous Daily   Continuous Infusions:   Time spent: 25 minutes  Marzetta Board, MD [facility] Pager [phone]. If 7 PM - 7 AM, please contact night-coverage at [email or web address], password [facility] 02/10/2014, 7:10 AM  LOS: 2 days

## 2014-02-11 LAB — BASIC METABOLIC PANEL
Anion gap: 18 — ABNORMAL HIGH (ref 5–15)
BUN: 57 mg/dL — AB (ref 6–23)
CALCIUM: 7.6 mg/dL — AB (ref 8.4–10.5)
CO2: 24 mEq/L (ref 19–32)
Chloride: 98 mEq/L (ref 96–112)
Creatinine, Ser: 2.35 mg/dL — ABNORMAL HIGH (ref 0.50–1.35)
GFR calc Af Amer: 27 mL/min — ABNORMAL LOW (ref >90)
GFR, EST NON AFRICAN AMERICAN: 23 mL/min — AB (ref >90)
GLUCOSE: 137 mg/dL — AB (ref 70–99)
POTASSIUM: 4.1 meq/L (ref 3.7–5.3)
Sodium: 140 mEq/L (ref 137–147)

## 2014-02-11 MED ORDER — ISOSORB DINITRATE-HYDRALAZINE 20-37.5 MG PO TABS
1.0000 | ORAL_TABLET | Freq: Three times a day (TID) | ORAL | Status: DC
  Administered 2014-02-11 – 2014-02-16 (×16): 1 via ORAL
  Filled 2014-02-11 (×19): qty 1

## 2014-02-11 NOTE — Progress Notes (Signed)
Physical Therapy Treatment Patient Details Name: Jesse Allen MRN: [id number] DOB: May 22, 1925 Today's Date: 02/11/2014    History of Present Illness Pt is an 80 y.o. male with history of CHF, HTN, Prostate Cancer, Arthritis and Gout admitted 02/08/14 for SOB and weakness, diagnosed with CHF.    PT Comments    Pt continues to require assist for mobility and presents with limited endurance.  Pt reports increased SOB with movement, DOE.  Continue to recommend SNF upon d/c.  Follow Up Recommendations  SNF     Equipment Recommendations  None recommended by PT    Recommendations for Other Services       Precautions / Restrictions Precautions Precautions: Fall Precaution Comments: monitor sats Restrictions Weight Bearing Restrictions: No    Mobility  Bed Mobility Overal bed mobility: Needs Assistance Bed Mobility: Supine to Sit     Supine to sit: Mod assist     General bed mobility comments: increased time and effort due to bil knee pain, assist for scoot to EOB and trunk upright  Transfers Overall transfer level: Needs assistance Equipment used: Rolling walker (2 wheeled) Transfers: Sit to/from Stand Sit to Stand: Mod assist;+2 physical assistance;From elevated surface         General transfer comment: assist to rise and steady  Ambulation/Gait Ambulation/Gait assistance: Min assist Ambulation Distance (Feet): 39 Feet (total) Assistive device: Rolling walker (2 wheeled) Gait Pattern/deviations: Step-through pattern;Decreased stride length;Trunk flexed Gait velocity: decr   General Gait Details: verbal cues for RW distance, posture, 3/4 dyspnea, SpO2 100% on 3 L O2, required seated rest break   Stairs            Wheelchair Mobility    Modified Rankin (Stroke Patients Only)       Balance                                    Cognition Arousal/Alertness: Awake/alert Behavior During Therapy: WFL for tasks assessed/performed Overall  Cognitive Status: Within Functional Limits for tasks assessed                      Exercises      General Comments        Pertinent Vitals/Pain Pain Assessment: 0-10 Pain Score: 8  Pain Location: knees with mobility Pain Descriptors / Indicators: Aching Pain Intervention(s): Limited activity within patient's tolerance;Monitored during session;Repositioned  SpO2 on 2L O2 79% however very cold fingers SpO2 91% on 3L O2, so ambulated on 3L O2 Used different pulse ox for SpO2 of 100% on 3L O2 during ambulating    Home Living                      Prior Function            PT Goals (current goals can now be found in the care plan section) Progress towards PT goals: Progressing toward goals    Frequency  Min 3X/week    PT Plan Current plan remains appropriate    Co-evaluation             End of Session Equipment Utilized During Treatment: Gait belt;Oxygen Activity Tolerance: Patient limited by pain;Patient limited by fatigue Patient left: in chair;with call bell/phone within reach;with family/visitor present     Time: 1027-1055 PT Time Calculation (min) (ACUTE ONLY): 28 min  Charges:  $Gait Training: 23-37 mins  G Codes:      Kirat Mezquita,KATHrine E February 25, 2014, 2:36 PM Carmelia Bake, PT, DPT 02/25/14 Pager: [phone]

## 2014-02-11 NOTE — Plan of Care (Signed)
Problem: Phase I Progression Outcomes Goal: Hemodynamically stable Outcome: Progressing Goal: Other Phase I Outcomes/Goals Outcome: Progressing  Problem: Phase II Progression Outcomes Goal: Vital signs remain stable Outcome: Completed/Met Date Met:  02/11/14

## 2014-02-11 NOTE — Progress Notes (Signed)
Patient Name: Jesse Allen Date of Encounter: 02/11/2014  Principal Problem:   Acute systolic CHF (congestive heart failure) Active Problems:   Gait abnormality   SOB (shortness of breath)   Essential hypertension   AKI (acute kidney injury)   Arthritis involving multiple sites   Elevated transaminase level   Dyspnea   Length of Stay: 3  SUBJECTIVE  He still reports his biggest problem is knees and hips clicking, reluctantly acknowledges he has a serious heart problem. No dyspnea at rest. Fair diuresis yesterday (net -1.8L since admission), still grossly edematous. Improving renal function and transaminases. BP better.  CURRENT MEDS . antiseptic oral rinse  7 mL Mouth Rinse BID  . aspirin EC  81 mg Oral Daily  . folic acid  1 mg Oral Daily  . heparin subcutaneous  5,000 Units Subcutaneous 3 times per day  . multivitamin with minerals  1 tablet Oral Daily  . potassium chloride  10 mEq Oral Daily  . sodium chloride  3 mL Intravenous Q12H  . thiamine  100 mg Oral Daily   Or  . thiamine  100 mg Intravenous Daily    OBJECTIVE   Intake/Output Summary (Last 24 hours) at 02/11/14 0931 Last data filed at 02/11/14 4854  Gross per 24 hour  Intake    243 ml  Output    885 ml  Net   -642 ml   Filed Weights   02/09/14 0529 02/10/14 0517 02/11/14 0453  Weight: 250 lb (113.399 kg) 245 lb 14.4 oz (111.54 kg) 243 lb 2.7 oz (110.3 kg)    PHYSICAL EXAM Filed Vitals:   02/10/14 1322 02/10/14 1815 02/10/14 2324 02/11/14 0453  BP: 152/68 123/69 130/75 136/82  Pulse: 57 93 63 58  Temp: 97.5 F (36.4 C) 97.7 F (36.5 C) 97.7 F (36.5 C) 97.4 F (36.3 C)  TempSrc: Axillary Oral Oral Oral  Resp: 20 20 20 20   Height:      Weight:    243 lb 2.7 oz (110.3 kg)  SpO2: 91% 100% 100% 98%   General: Alert, oriented x3, no distress Head: no evidence of trauma, PERRL, EOMI, no exophtalmos or lid lag, no myxedema, no xanthelasma; normal ears, nose and oropharynx Neck: 8 cm elevated  jugular venous pulsations and no hepatojugular reflux; brisk carotid pulses without delay and no carotid bruits Chest: clear to auscultation, no signs of consolidation by percussion or palpation, normal fremitus, symmetrical and full respiratory excursions Cardiovascular: normal position and quality of the apical impulse, regular rhythm, occasional ectopy,  normal first and second heart sounds, no rubs or gallops, 2/6 holosystolic murmur Abdomen: no tenderness or distention, no masses by palpation, no abnormal pulsatility or arterial bruits, normal bowel sounds, no hepatosplenomegaly Extremities: no clubbing, cyanosis; 2-3 + pitting symmetrical pretibial edema; 2+ radial, ulnar and brachial pulses bilaterally; 2+ right femoral, posterior tibial and dorsalis pedis pulses; 2+ left femoral, posterior tibial and dorsalis pedis pulses; no subclavian or femoral bruits Neurological: grossly nonfocal  LABS  CBC  Recent Labs  02/08/14 1655 02/10/14 0500  WBC 8.5 6.4  NEUTROABS 6.7  --   HGB 14.1 12.8*  HCT 42.3 38.8*  MCV 97.5 97.5  PLT 208 627   Basic Metabolic Panel  Recent Labs  02/10/14 0500 02/11/14 0440  NA 138 140  K 4.1 4.1  CL 98 98  CO2 22 24  GLUCOSE 140* 137*  BUN 54* 57*  CREATININE 2.52* 2.35*  CALCIUM 7.5* 7.6*   Liver Function Tests  Recent Labs  02/08/14 1655 02/10/14 0500  AST 206* 64*  ALT 136* 91*  ALKPHOS 95 94  BILITOT 1.4* 1.0  PROT 6.5 5.7*  ALBUMIN 3.5 3.0*   No results for input(s): LIPASE, AMYLASE in the last 72 hours. Cardiac Enzymes  Recent Labs  02/08/14 1655  TROPONINI <0.30   Hemoglobin A1C  Recent Labs  02/09/14 0505  HGBA1C 6.1*    Radiology Studies Imaging results have been reviewed   TELE NSR, PVCs  ECG NSR, PVCs , long QT  ASSESSMENT AND PLAN  Severe acute biventricular failure with 4 chamber enlargement and moderate to severe functional MR and TR, moderate to severe pulmonary HTN. Primary cause could be "burned  out" HTNive heart disease, possible role of alcohol abuse, cannot exclude underlying CAD (regional wall motion variations on echo, but normal nuclear perfusion in past and no angina). Improving renal function and transaminases suggest abnormalities are at least partly due to CHF (although alcohol abuse and intrinsic renal disease also important issues). Would not increase beta blocker further during acute exacerbation. Cannot use ACEi/ARB until renal function stabilizes. Start Bidil (although I think there is reason to question his future compliance with a TID drug). This will be superior to amlodipine for CHF, but he might need both for good BP control.   Sanda Klein, MD, [facility] [phone] office [phone] pager 02/11/2014 9:31 AM

## 2014-02-11 NOTE — Progress Notes (Addendum)
CSW following for discharge to [facility] when medically ready. CSW has completed FL2 (in shadow chart) & will continue to follow and assist with return - facility could accept patient on Friday.   Raynaldo Opitz, [facility] Clinical Social Worker cell #: [phone]

## 2014-02-11 NOTE — Progress Notes (Signed)
PROGRESS NOTE  Jesse Allen IOX:[id number] DOB: 1925-09-22 DOA: 02/08/2014 PCP: No primary care provider on file.  HPI: Jesse Allen is a 80 y.o. male with history of CHF, HTN, Prostate Cancer, Arthritis and Gout who presents to the ED with complaints of worsening SOB and DOE and orthopnea x 6 weeks. He reports that he takes Lasix once daily. He also reports having increased pain in both of his hips and in his knees and has difficulty walking due to pain. He reports that he has fallen twice within the past 2 months. He was evaluated in the ED and was found to have a Pro BNP of 62377.0.  Subjective/ 24 H Interval events Reports feeling much better than yesterday.   Assessment/Plan: Principal Problem:   Acute systolic CHF (congestive heart failure) Active Problems:   Gait abnormality   SOB (shortness of breath)   Essential hypertension   AKI (acute kidney injury)   Arthritis involving multiple sites   Elevated transaminase level   Dyspnea   Acute on chronic systolic CHF (congestive heart failure) - most recent 2D echo was in 2011 with EF 40-45%, repeat echo yesterday done showed an ejection fraction of 35%  - he is not following regularly with MDs and lives by himself - will continue IV Lasix, patient with evidence of fluid overload, daily weights, strict I&Os Cardiology consulted and recommendations given.   SOB (shortness of breath) - due to #1, IV Lasix - His breathing has improved with diuresis   AKI (acute kidney injury) on probable chronic renal disease - most recent creatinine normal in 2014 - endorses using NSAIDs for his arthritis  - avoid ACEI - His renal function is stable with ongoing diuresis, continue to monitor   Elevated transaminase level - in a pattern of ETOH use, monitor, improving   Essential hypertension - Lasix, Carvedilol  Arthritis involving multiple sites with gait abnormalities - Oxycodone QID PRN - PT consult  Alcohol abuse -  patient drinks daily, when asked what he uses for pain for his arthritis, states "alcohol". ETOH elevated on admission - started CIWA, not triggering as much - reports neuropathy, B12 normal   Diet: heart Fluids: none  DVT Prophylaxis: heparin  Code Status: Full Family Communication: none  Disposition Plan: inpatient, SNF when ready, social work consulted   Consultants:  Cardiology  Procedures:  2D echo pending - Left ventricle: Akinesis inferior wall. Severe hypokinesis inferolateral wall. Moderate hypokinesis anterolateral wall. The cavity size was mildly dilated. Wall thickness was increased in a pattern of mild LVH. The estimated ejection fraction was 35%.  - Aortic valve: Sclerosis without stenosis. There was mild regurgitation. - Aorta: Mild root dilitation. Aortic root dimension: 41 mm (ED). - Mitral valve: Mildly thickened leaflets . There was moderate to severe regurgitation. - Left atrium: The atrium was moderately to severely dilated. - Right ventricle: The cavity size was moderately dilated. Systolic function was moderately reduced. - Right atrium: The atrium was moderately to severely dilated. - Tricuspid valve: There was moderate-severe regurgitation. - Pulmonary arteries: PA peak pressure: 65 mm Hg (S).    Antibiotics None    Studies  No results found.   Objective  Filed Vitals:   02/10/14 1322 02/10/14 1815 02/10/14 2324 02/11/14 0453  BP: 152/68 123/69 130/75 136/82  Pulse: 57 93 63 58  Temp: 97.5 F (36.4 C) 97.7 F (36.5 C) 97.7 F (36.5 C) 97.4 F (36.3 C)  TempSrc: Axillary Oral Oral Oral  Resp: 20 20  20 20  Height:      Weight:    110.3 kg (243 lb 2.7 oz)  SpO2: 91% 100% 100% 98%    Intake/Output Summary (Last 24 hours) at 02/11/14 0744 Last data filed at 02/11/14 1610  Gross per 24 hour  Intake    243 ml  Output    885 ml  Net   -642 ml   Filed Weights   02/09/14 0529 02/10/14 0517 02/11/14 0453  Weight: 113.399 kg (250 lb)  111.54 kg (245 lb 14.4 oz) 110.3 kg (243 lb 2.7 oz)    Exam:  General:  NAD  Cardiovascular: RRR  Respiratory: CTA biL  Abdomen: soft, non tender  MSK: 2+ pitting edema, chronic venous stasis changes bilateral LE  Neuro: non focal  Data Reviewed: Basic Metabolic Panel:  Recent Labs Lab 02/08/14 1655 02/09/14 0505 02/10/14 0500 02/11/14 0440  NA 140 144 138 140  K 4.8 4.2 4.1 4.1  CL 98 102 98 98  CO2 18* 19 22 24   GLUCOSE 87 91 140* 137*  BUN 42* 44* 54* 57*  CREATININE 2.60* 2.52* 2.52* 2.35*  CALCIUM 8.1* 7.8* 7.5* 7.6*   Liver Function Tests:  Recent Labs Lab 02/08/14 1655 02/10/14 0500  AST 206* 64*  ALT 136* 91*  ALKPHOS 95 94  BILITOT 1.4* 1.0  PROT 6.5 5.7*  ALBUMIN 3.5 3.0*   CBC:  Recent Labs Lab 02/08/14 1655 02/10/14 0500  WBC 8.5 6.4  NEUTROABS 6.7  --   HGB 14.1 12.8*  HCT 42.3 38.8*  MCV 97.5 97.5  PLT 208 189   Cardiac Enzymes:  Recent Labs Lab 02/08/14 1655  TROPONINI <0.30   BNP (last 3 results)  Recent Labs  02/08/14 1655  PROBNP 62377.0*     Scheduled Meds: . antiseptic oral rinse  7 mL Mouth Rinse BID  . aspirin EC  81 mg Oral Daily  . folic acid  1 mg Oral Daily  . heparin subcutaneous  5,000 Units Subcutaneous 3 times per day  . multivitamin with minerals  1 tablet Oral Daily  . potassium chloride  10 mEq Oral Daily  . sodium chloride  3 mL Intravenous Q12H  . thiamine  100 mg Oral Daily   Or  . thiamine  100 mg Intravenous Daily   Continuous Infusions:   Time spent: 25 minutes  Marzetta Board, MD [facility] Pager [phone] 7 PM - 7 AM, please contact night-coverage at [email or web address], password [facility] 02/11/2014, 7:44 AM  LOS: 3 days

## 2014-02-11 NOTE — Clinical Documentation Improvement (Signed)
  PN states probable CKD. Please clarify Stage to reflect severity of illness and risk of mortality.  . Document the stage of CKD --Chronic kidney disease, stage 1- GFR > OR = 90 --Chronic kidney disease, stage 2 (mild) - GFR 60-89 --Chronic kidney disease, stage 3 (moderate) - GFR 30-59 --Chronic kidney disease, stage 4 (severe) - GFR 15-29 --Chronic kidney disease, stage 5- GFR < 15 --End-stage renal disease (ESRD) . Document any underlying cause of CKD such as Diabetes or Hypertension . Chronic renal failure without a documented stage will be assigned to Chronic kidney disease, unspecified with is nonspecific and low weighted  Supporting Information: -- GFR: 11/22: 24,  11/23 25,  11/24: 25,  11/25: 27  Thank You,  Barrie Dunker RN [place] HIM Department

## 2014-02-12 DIAGNOSIS — F1010 Alcohol abuse, uncomplicated: Secondary | ICD-10-CM

## 2014-02-12 NOTE — Plan of Care (Signed)
Problem: Phase III Progression Outcomes Goal: Pain controlled on oral analgesia Outcome: Completed/Met Date Met:  02/12/14 Goal: Activity at appropriate level-compared to baseline (UP IN CHAIR FOR HEMODIALYSIS)  Outcome: Progressing

## 2014-02-12 NOTE — Progress Notes (Signed)
Patient Name: Jesse Allen Date of Encounter: 02/12/2014  Principal Problem:   Acute systolic CHF (congestive heart failure) Active Problems:   Gait abnormality   SOB (shortness of breath)   Essential hypertension   AKI (acute kidney injury)   Arthritis involving multiple sites   Elevated transaminase level   Dyspnea   Length of Stay: 4  SUBJECTIVE  Slept well. Feels very weak.  CURRENT MEDS . antiseptic oral rinse  7 mL Mouth Rinse BID  . aspirin EC  81 mg Oral Daily  . folic acid  1 mg Oral Daily  . heparin subcutaneous  5,000 Units Subcutaneous 3 times per day  . isosorbide-hydrALAZINE  1 tablet Oral TID  . multivitamin with minerals  1 tablet Oral Daily  . sodium chloride  3 mL Intravenous Q12H  . thiamine  100 mg Oral Daily   Or  . thiamine  100 mg Intravenous Daily    OBJECTIVE   Intake/Output Summary (Last 24 hours) at 02/12/14 0954 Last data filed at 02/12/14 0730  Gross per 24 hour  Intake    483 ml  Output    550 ml  Net    -67 ml   Filed Weights   02/10/14 0517 02/11/14 0453 02/12/14 0511  Weight: 245 lb 14.4 oz (111.54 kg) 243 lb 2.7 oz (110.3 kg) 245 lb 6.4 oz (111.313 kg)    PHYSICAL EXAM Filed Vitals:   02/11/14 1328 02/11/14 1637 02/11/14 2005 02/12/14 0511  BP: 137/64 130/70 135/71 135/81  Pulse: 96 65 56 49  Temp: 97.7 F (36.5 C)  97.9 F (36.6 C) 97.6 F (36.4 C)  TempSrc: Oral  Oral Oral  Resp: 20  20 20   Height:      Weight:    245 lb 6.4 oz (111.313 kg)  SpO2: 96%  100% 100%   General: Alert, oriented x3, no distress Head: no evidence of trauma, PERRL, EOMI, no exophtalmos or lid lag, no myxedema, no xanthelasma; normal ears, nose and oropharynx Neck: normal jugular venous pulsations and no hepatojugular reflux; brisk carotid pulses without delay and no carotid bruits Chest: clear to auscultation, no signs of consolidation by percussion or palpation, normal fremitus, symmetrical and full respiratory excursions Cardiovascular:  lateral displacement of the apical impulse, regular rhythm, normal first and second heart sounds, no rubs or gallops, 2/6 holosystolic murmur Abdomen: no tenderness or distention, no masses by palpation, no abnormal pulsatility or arterial bruits, normal bowel sounds, no hepatosplenomegaly Extremities: no clubbing, cyanosis or edema; 2+ radial, ulnar and brachial pulses bilaterally; 2+ right femoral, posterior tibial and dorsalis pedis pulses; 2+ left femoral, posterior tibial and dorsalis pedis pulses; no subclavian or femoral bruits Neurological: grossly nonfocal  LABS  CBC  Recent Labs  02/10/14 0500  WBC 6.4  HGB 12.8*  HCT 38.8*  MCV 97.5  PLT 161   Basic Metabolic Panel  Recent Labs  02/10/14 0500 02/11/14 0440  NA 138 140  K 4.1 4.1  CL 98 98  CO2 22 24  GLUCOSE 140* 137*  BUN 54* 57*  CREATININE 2.52* 2.35*  CALCIUM 7.5* 7.6*   Liver Function Tests  Recent Labs  02/10/14 0500  AST 64*  ALT 91*  ALKPHOS 94  BILITOT 1.0  PROT 5.7*  ALBUMIN 3.0*    Radiology Studies Imaging results have been reviewed and No results found.  TELE NSR, PVCS  ASSESSMENT AND PLAN Severe acute biventricular failure with 4 chamber enlargement and moderate to severe functional MR and  TR, moderate to severe pulmonary HTN. Although CAD is a possibility, his functional status is poor and I do not think he would be a good revascularization candidate (if CAD is the cause of his cardiomyopathy it will probably be diffuse and require CABG). If his functional status can be improved, consider coronary angiography, but this could lead to worsening renal failure, even ESRD. Diuresing fairly well. Tolerating Bidil and BP is in desirable range Still hypervolemic. No labs today - order for AM. PT  - he may need SNF.  Sanda Klein, MD, [facility] [phone] office [phone] pager 02/12/2014 9:54 AM

## 2014-02-12 NOTE — Progress Notes (Signed)
PROGRESS NOTE  Jesse Allen TDV:[id number] DOB: [birth year] DOA: 02/08/2014 PCP: No primary care provider on file.  HPI: Jesse Allen is a 80 y.o. male with history of CHF, HTN, Prostate Cancer, Arthritis and Gout who presents to the ED with complaints of worsening SOB and DOE and orthopnea x 6 weeks. He reports that he takes Lasix once daily. He also reports having increased pain in both of his hips and in his knees and has difficulty walking due to pain. He reports that he has fallen twice within the past 2 months. He was evaluated in the ED and was found to have [place] 62377.0.  Subjective/ 24 H Interval events Reports feeling much better than yesterday. He is cheerful. No new complaints. Off oxygen.   Assessment/Plan: Principal Problem:   Acute systolic CHF (congestive heart failure) Active Problems:   Gait abnormality   SOB (shortness of breath)   Essential hypertension   AKI (acute kidney injury)   Arthritis involving multiple sites   Elevated transaminase level   Dyspnea   Acute on chronic systolic CHF (congestive heart failure) - most recent 2D echo was in 2011 with EF 40-45%, repeat echo yesterday done showed an ejection fraction of 35%  - he is not following regularly with MDs and lives by himself - will continue IV Lasix, patient with evidence of fluid overload, daily weights, strict I&Os Cardiology consulted and recommendations given.  -I/O last 3 completed shifts: In: 963 [P.O.:960; I.V.:3] Out: 1035 [Urine:1035] Total I/O In: -  Out: 375 [Urine:375]     SOB (shortness of breath) - due to #1, IV Lasix - His breathing has improved with diuresis   AKI (acute kidney injury) on probable chronic renal disease - most recent creatinine normal in 2014 - endorses using NSAIDs for his arthritis  - avoid ACEI - His renal function is stable with ongoing diuresis, continue to monitor with repeat bmp.   Elevated transaminase level - in a pattern of ETOH  use, monitor, improving   Essential hypertension - Lasix, Carvedilol  Arthritis involving multiple sites with gait abnormalities - Oxycodone QID PRN - PT consult  Alcohol abuse - patient drinks daily, when asked what he uses for pain for his arthritis, states "alcohol". ETOH elevated on admission - started CIWA, not triggering as much - reports neuropathy, B12 normal   Diet: heart Fluids: none  DVT Prophylaxis: heparin  Code Status: Full Family Communication: none  Disposition Plan: inpatient, SNF when ready, social work consulted   Consultants:  Cardiology  Procedures:  2D echo pending - Left ventricle: Akinesis inferior wall. Severe hypokinesis inferolateral wall. Moderate hypokinesis anterolateral wall. The cavity size was mildly dilated. Wall thickness was increased in a pattern of mild LVH. The estimated ejection fraction was 35%.  - Aortic valve: Sclerosis without stenosis. There was mild regurgitation. - Aorta: Mild root dilitation. Aortic root dimension: 41 mm (ED). - Mitral valve: Mildly thickened leaflets . There was moderate to severe regurgitation. - Left atrium: The atrium was moderately to severely dilated. - Right ventricle: The cavity size was moderately dilated. Systolic function was moderately reduced. - Right atrium: The atrium was moderately to severely dilated. - Tricuspid valve: There was moderate-severe regurgitation. - Pulmonary arteries: PA peak pressure: 65 mm Hg (S).    Antibiotics None    Studies  No results found.   Objective  Filed Vitals:   02/11/14 1637 02/11/14 2005 02/12/14 0511 02/12/14 1414  BP: 130/70 135/71 135/81 134/64  Pulse: 65 56 49 59  Temp:  97.9 F (36.6 C) 97.6 F (36.4 C) 97.5 F (36.4 C)  TempSrc:  Oral Oral Oral  Resp:  20 20 18   Height:      Weight:   111.313 kg (245 lb 6.4 oz)   SpO2:  100% 100% 99%    Intake/Output Summary (Last 24 hours) at 02/12/14 1422 Last data filed at 02/12/14 1420  Gross  per 24 hour  Intake    240 ml  Output    925 ml  Net   -685 ml   Filed Weights   02/10/14 0517 02/11/14 0453 02/12/14 0511  Weight: 111.54 kg (245 lb 14.4 oz) 110.3 kg (243 lb 2.7 oz) 111.313 kg (245 lb 6.4 oz)    Exam:  General:  NAD  Cardiovascular: RRR  Respiratory: CTA biL  Abdomen: soft, non tender  MSK: 2+ pitting edema, chronic venous stasis changes bilateral LE  Neuro: non focal  Data Reviewed: Basic Metabolic Panel:  Recent Labs Lab 02/08/14 1655 02/09/14 0505 02/10/14 0500 02/11/14 0440  NA 140 144 138 140  K 4.8 4.2 4.1 4.1  CL 98 102 98 98  CO2 18* 19 22 24   GLUCOSE 87 91 140* 137*  BUN 42* 44* 54* 57*  CREATININE 2.60* 2.52* 2.52* 2.35*  CALCIUM 8.1* 7.8* 7.5* 7.6*   Liver Function Tests:  Recent Labs Lab 02/08/14 1655 02/10/14 0500  AST 206* 64*  ALT 136* 91*  ALKPHOS 95 94  BILITOT 1.4* 1.0  PROT 6.5 5.7*  ALBUMIN 3.5 3.0*   CBC:  Recent Labs Lab 02/08/14 1655 02/10/14 0500  WBC 8.5 6.4  NEUTROABS 6.7  --   HGB 14.1 12.8*  HCT 42.3 38.8*  MCV 97.5 97.5  PLT 208 189   Cardiac Enzymes:  Recent Labs Lab 02/08/14 1655  TROPONINI <0.30   BNP (last 3 results)  Recent Labs  02/08/14 1655  PROBNP 62377.0*     Scheduled Meds: . antiseptic oral rinse  7 mL Mouth Rinse BID  . aspirin EC  81 mg Oral Daily  . folic acid  1 mg Oral Daily  . heparin subcutaneous  5,000 Units Subcutaneous 3 times per day  . isosorbide-hydrALAZINE  1 tablet Oral TID  . multivitamin with minerals  1 tablet Oral Daily  . sodium chloride  3 mL Intravenous Q12H  . thiamine  100 mg Oral Daily   Or  . thiamine  100 mg Intravenous Daily   Continuous Infusions:   Time spent: 15 minutes  Marzetta Board, MD [facility] Pager [phone] 7 PM - 7 AM, please contact night-coverage at [email or web address], password [facility] 02/12/2014, 2:22 PM  LOS: 4 days

## 2014-02-12 NOTE — Plan of Care (Signed)
Problem: Phase I Progression Outcomes Goal: Initial discharge plan identified Outcome: Completed/Met Date Met:  02/12/14 Goal: Hemodynamically stable Outcome: Completed/Met Date Met:  02/12/14 Goal: Other Phase I Outcomes/Goals Outcome: Not Applicable Date Met:  02/12/14  Problem: Phase II Progression Outcomes Goal: Discharge plan established Outcome: Completed/Met Date Met:  02/12/14 Goal: Obtain order to discontinue catheter if appropriate Outcome: Not Applicable Date Met:  02/12/14 Goal: Other Phase II Outcomes/Goals Outcome: Not Applicable Date Met:  02/12/14     

## 2014-02-13 LAB — COMPREHENSIVE METABOLIC PANEL
ALT: 58 U/L — ABNORMAL HIGH (ref 0–53)
ANION GAP: 16 — AB (ref 5–15)
AST: 34 U/L (ref 0–37)
Albumin: 3.1 g/dL — ABNORMAL LOW (ref 3.5–5.2)
Alkaline Phosphatase: 91 U/L (ref 39–117)
BUN: 54 mg/dL — AB (ref 6–23)
CO2: 24 meq/L (ref 19–32)
Calcium: 7.8 mg/dL — ABNORMAL LOW (ref 8.4–10.5)
Chloride: 98 mEq/L (ref 96–112)
Creatinine, Ser: 1.79 mg/dL — ABNORMAL HIGH (ref 0.50–1.35)
GFR calc non Af Amer: 33 mL/min — ABNORMAL LOW (ref >90)
GFR, EST AFRICAN AMERICAN: 38 mL/min — AB (ref >90)
GLUCOSE: 110 mg/dL — AB (ref 70–99)
POTASSIUM: 4.3 meq/L (ref 3.7–5.3)
Sodium: 138 mEq/L (ref 137–147)
TOTAL PROTEIN: 6 g/dL (ref 6.0–8.3)
Total Bilirubin: 0.8 mg/dL (ref 0.3–1.2)

## 2014-02-13 LAB — PRO B NATRIURETIC PEPTIDE: Pro B Natriuretic peptide (BNP): 23222 pg/mL — ABNORMAL HIGH (ref 0–450)

## 2014-02-13 MED ORDER — ISOSORB DINITRATE-HYDRALAZINE 20-37.5 MG PO TABS: 1.0000 | ORAL_TABLET | Freq: Three times a day (TID) | ORAL | Status: DC

## 2014-02-13 MED ORDER — FUROSEMIDE 80 MG PO TABS: 80.0000 mg | ORAL_TABLET | Freq: Every day | ORAL | Status: DC

## 2014-02-13 MED ORDER — FUROSEMIDE 40 MG PO TABS
80.0000 mg | ORAL_TABLET | Freq: Every day | ORAL | Status: DC
  Administered 2014-02-13 – 2014-02-15 (×3): 80 mg via ORAL
  Filled 2014-02-13 (×3): qty 2

## 2014-02-13 MED ORDER — ADULT MULTIVITAMIN W/MINERALS CH: 1.0000 | ORAL_TABLET | Freq: Every day | ORAL | Status: DC

## 2014-02-13 NOTE — Progress Notes (Signed)
PROGRESS NOTE  Jesse Allen:[id number] DOB: 1925/09/21 DOA: 02/08/2014 PCP: No primary care provider on file.  HPI: Jesse Allen is a 80 y.o. male with history of CHF, HTN, Prostate Cancer, Arthritis and Gout who presents to the ED with complaints of worsening SOB and DOE and orthopnea x 6 weeks. He reports that he takes Lasix once daily. He also reports having increased pain in both of his hips and in his knees and has difficulty walking due to pain. He reports that he has fallen twice within the past 2 months. He was evaluated in the ED and was found to have a [place] BNP of 62377.0.  Subjective/ 24 H Interval events No new complaints. He is cheerful. No new complaints. Off oxygen.   Assessment/Plan: Principal Problem:   Acute systolic CHF (congestive heart failure) Active Problems:   Gait abnormality   SOB (shortness of breath)   Essential hypertension   AKI (acute kidney injury)   Arthritis involving multiple sites   Elevated transaminase level   Dyspnea   Alcohol abuse   Acute on chronic systolic CHF (congestive heart failure) - most recent 2D echo was in 2011 with EF 40-45%, repeat echo yesterday done showed an ejection fraction of 35%  - he is not following regularly with MDs and lives by himself Started on IV lasix, transitioned to po lasix by cardiology.  patient with evidence of fluid overload, daily weights, strict I&Os Cardiology consulted and recommendations given.  -I/O last 3 completed shifts: In: -  Out: 1500 [Urine:1500] Total I/O In: 243 [P.O.:240; I.V.:3] Out: 600 [Urine:600]     SOB (shortness of breath) - due to #1,  Lasix - His breathing has improved with diuresis   AKI (acute kidney injury) on probable chronic renal disease - most recent creatinine normal in 2014 - endorses using NSAIDs for his arthritis  - avoid ACEI - His renal function is stable with ongoing diuresis, continue to monitor with repeat bmp.   Elevated transaminase  level - in a pattern of ETOH use, monitor, improving   Essential hypertension - Lasix, Carvedilol  Arthritis involving multiple sites with gait abnormalities - Oxycodone QID PRN - PT consult  Alcohol abuse - patient drinks daily, when asked what he uses for pain for his arthritis, states "alcohol". ETOH elevated on admission - started CIWA, not triggering as much - reports neuropathy, B12 normal   Diet: heart Fluids: none  DVT Prophylaxis: heparin  Code Status: Full Family Communication: none  Disposition Plan: inpatient, SNF when ready, social work consulted   Consultants:  Cardiology  Procedures:  2D echo pending - Left ventricle: Akinesis inferior wall. Severe hypokinesis inferolateral wall. Moderate hypokinesis anterolateral wall. The cavity size was mildly dilated. Wall thickness was increased in a pattern of mild LVH. The estimated ejection fraction was 35%.  - Aortic valve: Sclerosis without stenosis. There was mild regurgitation. - Aorta: Mild root dilitation. Aortic root dimension: 41 mm (ED). - Mitral valve: Mildly thickened leaflets . There was moderate to severe regurgitation. - Left atrium: The atrium was moderately to severely dilated. - Right ventricle: The cavity size was moderately dilated. Systolic function was moderately reduced. - Right atrium: The atrium was moderately to severely dilated. - Tricuspid valve: There was moderate-severe regurgitation. - Pulmonary arteries: PA peak pressure: 65 mm Hg (S).    Antibiotics None    Studies  No results found.   Objective  Filed Vitals:   02/12/14 1414 02/12/14 2025 02/12/14 2249 02/13/14  0411  BP: 134/64 154/84 142/68 157/88  Pulse: 59 66 64 67  Temp: 97.5 F (36.4 C) 98.1 F (36.7 C)  97.8 F (36.6 C)  TempSrc: Oral Oral  Oral  Resp: 18 20  20   Height:      Weight:    111.6 kg (246 lb 0.5 oz)  SpO2: 99% 100%  100%    Intake/Output Summary (Last 24 hours) at 02/13/14 1737 Last data  filed at 02/13/14 1452  Gross per 24 hour  Intake    243 ml  Output   1175 ml  Net   -932 ml   Filed Weights   02/11/14 0453 02/12/14 0511 02/13/14 0411  Weight: 110.3 kg (243 lb 2.7 oz) 111.313 kg (245 lb 6.4 oz) 111.6 kg (246 lb 0.5 oz)    Exam:  General:  NAD  Cardiovascular: RRR  Respiratory: CTA biL  Abdomen: soft, non tender  MSK: 2+ pitting edema, chronic venous stasis changes bilateral LE  Neuro: non focal  Data Reviewed: Basic Metabolic Panel:  Recent Labs Lab 02/08/14 1655 02/09/14 0505 02/10/14 0500 02/11/14 0440 02/13/14 0405  NA 140 144 138 140 138  K 4.8 4.2 4.1 4.1 4.3  CL 98 102 98 98 98  CO2 18* 19 22 24 24   GLUCOSE 87 91 140* 137* 110*  BUN 42* 44* 54* 57* 54*  CREATININE 2.60* 2.52* 2.52* 2.35* 1.79*  CALCIUM 8.1* 7.8* 7.5* 7.6* 7.8*   Liver Function Tests:  Recent Labs Lab 02/08/14 1655 02/10/14 0500 02/13/14 0405  AST 206* 64* 34  ALT 136* 91* 58*  ALKPHOS 95 94 91  BILITOT 1.4* 1.0 0.8  PROT 6.5 5.7* 6.0  ALBUMIN 3.5 3.0* 3.1*   CBC:  Recent Labs Lab 02/08/14 1655 02/10/14 0500  WBC 8.5 6.4  NEUTROABS 6.7  --   HGB 14.1 12.8*  HCT 42.3 38.8*  MCV 97.5 97.5  PLT 208 189   Cardiac Enzymes:  Recent Labs Lab 02/08/14 1655  TROPONINI <0.30   BNP (last 3 results)  Recent Labs  02/08/14 1655 02/13/14 0405  PROBNP 62377.0* 23222.0*     Scheduled Meds: . antiseptic oral rinse  7 mL Mouth Rinse BID  . aspirin EC  81 mg Oral Daily  . folic acid  1 mg Oral Daily  . furosemide  80 mg Oral Daily  . heparin subcutaneous  5,000 Units Subcutaneous 3 times per day  . isosorbide-hydrALAZINE  1 tablet Oral TID  . multivitamin with minerals  1 tablet Oral Daily  . sodium chloride  3 mL Intravenous Q12H  . thiamine  100 mg Oral Daily   Or  . thiamine  100 mg Intravenous Daily   Continuous Infusions:   Time spent: 15 minutes  Hosie Poisson, MD [facility] Pager [phone] 7 PM - 7 AM, please contact  night-coverage at [email or web address], password [facility] 02/13/2014, 5:37 PM  LOS: 5 days

## 2014-02-13 NOTE — Progress Notes (Signed)
Physical Therapy Treatment Patient Details Name: Jesse Allen MRN: [id number] DOB: 07/28/1925 Today's Date: 03-08-14    History of Present Illness Pt is an 80 y.o. male with history of CHF, HTN, Prostate Cancer, Arthritis and Gout admitted 02/08/14 for SOB and weakness, diagnosed with CHF.    PT Comments    Pt reports increased fatigue and weakness and required increased encouragement to perform mobility.  Pt eventually agreeable to OOB to recliner however declined ambulation despite benefits and encouragement.  Follow Up Recommendations  SNF     Equipment Recommendations  None recommended by PT    Recommendations for Other Services       Precautions / Restrictions Precautions Precautions: Fall    Mobility  Bed Mobility Overal bed mobility: Needs Assistance Bed Mobility: Supine to Sit     Supine to sit: Max assist;+2 for physical assistance;HOB elevated     General bed mobility comments: assist for lower body and trunk upright  Transfers Overall transfer level: Needs assistance Equipment used: Rolling walker (2 wheeled) Transfers: Sit to/from [employer] Transfers Sit to Stand: +2 physical assistance;From elevated surface;Min assist Stand pivot transfers: Min assist;+2 physical assistance       General transfer comment: assist to rise and steady, pivot to recliner  Ambulation/Gait Ambulation/Gait assistance:  (pt declined due to fatigue)               Stairs            Wheelchair Mobility    Modified Rankin (Stroke Patients Only)       Balance                                    Cognition Arousal/Alertness: Awake/alert Behavior During Therapy: WFL for tasks assessed/performed Overall Cognitive Status: Within Functional Limits for tasks assessed                      Exercises      General Comments        Pertinent Vitals/Pain Pain Assessment: 0-10 Pain Score: 7  Pain Location: knees with  mobility Pain Descriptors / Indicators: Aching;Sore Pain Intervention(s): Limited activity within patient's tolerance;Monitored during session;Repositioned    Home Living                      Prior Function            PT Goals (current goals can now be found in the care plan section) Progress towards PT goals: Progressing toward goals    Frequency  Min 3X/week    PT Plan Current plan remains appropriate    Co-evaluation             End of Session Equipment Utilized During Treatment: Gait belt Activity Tolerance: Patient limited by pain;Patient limited by fatigue Patient left: in chair;with call bell/phone within reach     Time: 2979-8921 PT Time Calculation (min) (ACUTE ONLY): 17 min  Charges:  $Therapeutic Activity: 8-22 mins                    G Codes:      Huberta Tompkins,KATHrine E March 08, 2014, 3:36 PM Carmelia Bake, PT, DPT 2014-03-08 Pager: [phone]

## 2014-02-13 NOTE — Progress Notes (Addendum)
Patient Name: Jesse Allen Date of Encounter: 02/13/2014  Principal Problem:   Acute systolic CHF (congestive heart failure) Active Problems:   Gait abnormality   SOB (shortness of breath)   Essential hypertension   AKI (acute kidney injury)   Arthritis involving multiple sites   Elevated transaminase level   Dyspnea   Alcohol abuse   Length of Stay: 5  SUBJECTIVE  Joint pain, otherwise feels OK. Continues to diurese fairly well, with actual improvement in renal function despite volume loss. Approximately 4 lb down by weight, 2.6L by fluid balance. Tolerating Bidil. BP relatively high. Note nocturnal bradycardia despite no beta blockers.  CURRENT MEDS . antiseptic oral rinse  7 mL Mouth Rinse BID  . aspirin EC  81 mg Oral Daily  . folic acid  1 mg Oral Daily  . heparin subcutaneous  5,000 Units Subcutaneous 3 times per day  . isosorbide-hydrALAZINE  1 tablet Oral TID  . multivitamin with minerals  1 tablet Oral Daily  . sodium chloride  3 mL Intravenous Q12H  . thiamine  100 mg Oral Daily   Or  . thiamine  100 mg Intravenous Daily    OBJECTIVE   Intake/Output Summary (Last 24 hours) at 02/13/14 0816 Last data filed at 02/13/14 0559  Gross per 24 hour  Intake      0 ml  Output    950 ml  Net   -950 ml   Filed Weights   02/11/14 0453 02/12/14 0511 02/13/14 0411  Weight: 243 lb 2.7 oz (110.3 kg) 245 lb 6.4 oz (111.313 kg) 246 lb 0.5 oz (111.6 kg)    PHYSICAL EXAM Filed Vitals:   02/12/14 1414 02/12/14 2025 02/12/14 2249 02/13/14 0411  BP: 134/64 154/84 142/68 157/88  Pulse: 59 66 64 67  Temp: 97.5 F (36.4 C) 98.1 F (36.7 C)  97.8 F (36.6 C)  TempSrc: Oral Oral  Oral  Resp: 18 20  20   Height:      Weight:    246 lb 0.5 oz (111.6 kg)  SpO2: 99% 100%  100%   General: Alert, oriented x3, no distress Head: no evidence of trauma, PERRL, EOMI, no exophtalmos or lid lag, no myxedema, no xanthelasma; normal ears, nose and oropharynx Neck: normal jugular  venous pulsations and no hepatojugular reflux; brisk carotid pulses without delay and no carotid bruits Chest: clear to auscultation, no signs of consolidation by percussion or palpation, normal fremitus, symmetrical and full respiratory excursions Cardiovascular: lateral displacement of the apical impulse, regular rhythm, normal first and second heart sounds, no rubs or gallops, 2/6 holosystolic murmur Abdomen: no tenderness or distention, no masses by palpation, no abnormal pulsatility or arterial bruits, normal bowel sounds, no hepatosplenomegaly Extremities: no clubbing, cyanosis or edema; 2+ radial, ulnar and brachial pulses bilaterally; 2+ right femoral, posterior tibial and dorsalis pedis pulses; 2+ left femoral, posterior tibial and dorsalis pedis pulses; no subclavian or femoral bruits Neurological: grossly nonfocal  LABS  Basic Metabolic Panel  Recent Labs  02/11/14 0440 02/13/14 0405  NA 140 138  K 4.1 4.3  CL 98 98  CO2 24 24  GLUCOSE 137* 110*  BUN 57* 54*  CREATININE 2.35* 1.79*  CALCIUM 7.6* 7.8*   Liver Function Tests  Recent Labs  02/13/14 0405  AST 34  ALT 58*  ALKPHOS 91  BILITOT 0.8  PROT 6.0  ALBUMIN 3.1*   Radiology Studies Imaging results have been reviewed and No results found.  TELE NSR, PVCs  ASSESSMENT AND  PLAN  Still appears hypervolemic. Hard to know what his dry weight is and no baseline proBNP available (old results are NT-BNP) - hard to know when to stop diuresis. If renal function continues to improve, add ACEi/ ARB. I would hesitate to start a beta blocker yet - wait for outpatient appointment probably. Alcohol abstinence is recommended. Not sure he understands it is important.  Sanda Klein, MD, [facility] [phone] office [phone] pager 02/13/2014 8:16 AM

## 2014-02-13 NOTE — Discharge Summary (Signed)
Physician Discharge Summary  Jesse Allen:[id number] DOB: [birth year] DOA: 02/08/2014  PCP: No primary care provider on file.  Admit date: 02/08/2014 Discharge date: 02/13/2014  Time spent:  minutes  Recommendations for Outpatient Follow-up:  Follow up with PHYSICAL THERAPY Outpatient follow up with cardiology as recommended Please follow up with a repeat BMP in 1 to 2 days.   Discharge Diagnoses:  Principal Problem:   Acute systolic CHF (congestive heart failure) Active Problems:   Gait abnormality   SOB (shortness of breath)   Essential hypertension   AKI (acute kidney injury)   Arthritis involving multiple sites   Elevated transaminase level   Dyspnea   Alcohol abuse   Discharge Condition: improving.   Diet recommendation: low salt diet  Filed Weights   02/11/14 0453 02/12/14 0511 02/13/14 0411  Weight: 110.3 kg (243 lb 2.7 oz) 111.313 kg (245 lb 6.4 oz) 111.6 kg (246 lb 0.5 oz)    History of present illness:  Jesse Allen is a 80 y.o. male with history of CHF, HTN, Prostate Cancer, Arthritis and Gout who presents to the ED with complaints of worsening SOB and DOE and orthopnea x 6 weeks. He reports that he takes Lasix once daily. He also reports having increased pain in both of his hips and in his knees and has difficulty walking due to pain. He reports that he has fallen twice within the past 2 months. He was evaluated in the ED and was found to have a [place] BNP of 62377.0.  Hospital Course:   Acute systolic CHF (congestive heart failure) Active Problems:  Gait abnormality  SOB (shortness of breath)  Essential hypertension  AKI (acute kidney injury)  Arthritis involving multiple sites  Elevated transaminase level  Dyspnea   Acute on chronic systolic CHF (congestive heart failure) - most recent 2D echo was in 2011 with EF 40-45%, repeat echo yesterday done showed an ejection fraction of 35%  - he is not following regularly with MDs and lives by  himself IV lasix ordered and transitioned top o lasix.  daily weights, strict I&Os Cardiology consulted and recommendations given.     SOB (shortness of breath) - due to #1, IV Lasix  changed to po lasix.  - His breathing has improved with diuresis   AKI (acute kidney injury) on probable chronic renal disease - most recent creatinine normal in 2014 - endorses using NSAIDs for his arthritis  - avoid ACEI - His renal function is stable with ongoing diuresis, continue to monitor with repeat bmp as outpatient.   Elevated transaminase level - in a pattern of ETOH use, improving   Essential hypertension - Lasix, Carvedilol  Arthritis involving multiple sites with gait abnormalities - Oxycodone QID PRN - PT consult  Alcohol abuse - patient drinks daily,  - started CIWA,not in withdrawal. - reports neuropathy, B12 normal Procedures:  none  Consultations: cardiology Discharge Exam: Filed Vitals:   02/13/14 0411  BP: 157/88  Pulse: 67  Temp: 97.8 F (36.6 C)  Resp: 20    General: alert afebrile comfortable Cardiovascular: s1s2 Respiratory: ctab  Discharge Instructions You were cared for by a hospitalist during your hospital stay. If you have any questions about your discharge medications or the care you received while you were in the hospital after you are discharged, you can call the unit and asked to speak with the hospitalist on call if the hospitalist that took care of you is not available. Once you are discharged, your primary care  physician will handle any further medical issues. Please note that NO REFILLS for any discharge medications will be authorized once you are discharged, as it is imperative that you return to your primary care physician (or establish a relationship with a primary care physician if you do not have one) for your aftercare needs so that they can reassess your need for medications and monitor your lab values.   Current Discharge Medication List     START taking these medications   Details  isosorbide-hydrALAZINE (BIDIL) 20-37.5 MG per tablet Take 1 tablet by mouth 3 (three) times daily.    Multiple Vitamin (MULTIVITAMIN WITH MINERALS) TABS tablet Take 1 tablet by mouth daily.      CONTINUE these medications which have CHANGED   Details  furosemide (LASIX) 80 MG tablet Take 1 tablet (80 mg total) by mouth daily. Qty: 30 tablet, Refills: 3      CONTINUE these medications which have NOT CHANGED   Details  aspirin EC 81 MG EC tablet Take 1 tablet (81 mg total) by mouth daily.    gabapentin (NEURONTIN) 100 MG capsule Take 1 capsule (100 mg total) by mouth 2 (two) times daily. Qty: 60 capsule, Refills: 1    pantoprazole (PROTONIX) 40 MG tablet Take 1 tablet (40 mg total) by mouth daily at 12 noon. Qty: 30 tablet, Refills: 1    tamsulosin (FLOMAX) 0.4 MG CAPS capsule Take 1 capsule (0.4 mg total) by mouth daily after supper. Qty: 30 capsule, Refills: 1      STOP taking these medications     amLODipine (NORVASC) 10 MG tablet      predniSONE (DELTASONE) 10 MG tablet      traMADol (ULTRAM) 50 MG tablet        No Known Allergies    The results of significant diagnostics from this hospitalization (including imaging, microbiology, ancillary and laboratory) are listed below for reference.    Significant Diagnostic Studies: Dg Chest 2 View  02/08/2014   CLINICAL DATA:  Weakness.  Shortness of breath.  EXAM: CHEST  2 VIEW  COMPARISON:  10/01/2009  FINDINGS: Low lung volumes are present, causing crowding of the pulmonary vasculature. Mildly enlarged cardiopericardial silhouette. Tortuous thoracic aorta. Mild airway thickening. Mildly thickened minor fissure.  IMPRESSION: 1. Airway thickening is present, suggesting bronchitis or reactive airways disease. 2. Enlarged cardiopericardial silhouette without overt edema, although there is some trace fluid in the minor fissure.   Electronically Signed   By: Sherryl Barters M.D.   On:  02/08/2014 17:41    Microbiology: No results found for this or any previous visit (from the past 240 hour(s)).   Labs: Basic Metabolic Panel:  Recent Labs Lab 02/08/14 1655 02/09/14 0505 02/10/14 0500 02/11/14 0440 02/13/14 0405  NA 140 144 138 140 138  K 4.8 4.2 4.1 4.1 4.3  CL 98 102 98 98 98  CO2 18* 19 22 24 24   GLUCOSE 87 91 140* 137* 110*  BUN 42* 44* 54* 57* 54*  CREATININE 2.60* 2.52* 2.52* 2.35* 1.79*  CALCIUM 8.1* 7.8* 7.5* 7.6* 7.8*   Liver Function Tests:  Recent Labs Lab 02/08/14 1655 02/10/14 0500 02/13/14 0405  AST 206* 64* 34  ALT 136* 91* 58*  ALKPHOS 95 94 91  BILITOT 1.4* 1.0 0.8  PROT 6.5 5.7* 6.0  ALBUMIN 3.5 3.0* 3.1*   No results for input(s): LIPASE, AMYLASE in the last 168 hours. No results for input(s): AMMONIA in the last 168 hours. CBC:  Recent Labs Lab  02/08/14 1655 02/10/14 0500  WBC 8.5 6.4  NEUTROABS 6.7  --   HGB 14.1 12.8*  HCT 42.3 38.8*  MCV 97.5 97.5  PLT 208 189   Cardiac Enzymes:  Recent Labs Lab 02/08/14 1655  TROPONINI <0.30   BNP: BNP (last 3 results)  Recent Labs  02/08/14 1655 02/13/14 0405  PROBNP 62377.0* 23222.0*   CBG: No results for input(s): GLUCAP in the last 168 hours.     SignedHosie Poisson  [facility] 02/13/2014, 3:27 PM

## 2014-02-13 NOTE — Plan of Care (Signed)
Problem: Phase III Progression Outcomes Goal: Voiding independently Outcome: Not Progressing Goal: IV/normal saline lock discontinued Outcome: Completed/Met Date Met:  02/13/14 Goal: Foley discontinued Outcome: Completed/Met Date Met:  02/13/14 Goal: Discharge plan remains appropriate-arrangements made Outcome: Progressing Goal: Other Phase III Outcomes/Goals Outcome: Completed/Met Date Met:  02/13/14  Problem: Discharge Progression Outcomes Goal: Discharge plan in place and appropriate Outcome: Progressing Goal: Pain controlled with appropriate interventions Outcome: Completed/Met Date Met:  02/13/14 Goal: Hemodynamically stable Outcome: Completed/Met Date Met:  89/84/21 Goal: Complications resolved/controlled Outcome: Progressing Goal: Tolerating diet Outcome: Completed/Met Date Met:  02/13/14 Goal: Activity appropriate for discharge plan Outcome: Progressing

## 2014-02-13 NOTE — Clinical Social Work Note (Signed)
DC Summary faxed to [facility]. Facility states that they will be able to accept patient over the weekend if ready for DC. Family also aware that patient will likely be ready for weekend DC. Report left for weekend CSW.   Liz Beach MSW, [place], [place], [phone]

## 2014-02-13 NOTE — Plan of Care (Signed)
Problem: Phase II Progression Outcomes Goal: Progress activity as tolerated unless otherwise ordered Outcome: Completed/Met Date Met:  02/13/14 Activity progression to be continued at [facility] after D/C

## 2014-02-14 LAB — BASIC METABOLIC PANEL
ANION GAP: 15 (ref 5–15)
BUN: 45 mg/dL — ABNORMAL HIGH (ref 6–23)
CALCIUM: 7.8 mg/dL — AB (ref 8.4–10.5)
CO2: 24 mEq/L (ref 19–32)
CREATININE: 1.63 mg/dL — AB (ref 0.50–1.35)
Chloride: 94 mEq/L — ABNORMAL LOW (ref 96–112)
GFR calc non Af Amer: 37 mL/min — ABNORMAL LOW (ref >90)
GFR, EST AFRICAN AMERICAN: 42 mL/min — AB (ref >90)
Glucose, Bld: 120 mg/dL — ABNORMAL HIGH (ref 70–99)
Potassium: 4.6 mEq/L (ref 3.7–5.3)
Sodium: 133 mEq/L — ABNORMAL LOW (ref 137–147)

## 2014-02-14 NOTE — Progress Notes (Signed)
Patient ID: Jesse Allen, male   DOB: [birth year], 80 y.o.   MRN: [id number]    SUBJECTIVE:  The patient is flat in bed. He has protective mittens on his hands. He has some discomfort in his knees. He is not currently short of breath. He is diuresing.   Filed Vitals:   02/12/14 2249 02/13/14 0411 02/14/14 0013 02/14/14 0454  BP: 142/68 157/88 137/61 138/65  Pulse: 64 67 64 52  Temp:  97.8 F (36.6 C) 97.8 F (36.6 C) 97.6 F (36.4 C)  TempSrc:  Oral Oral Oral  Resp:  20 18 20   Height:      Weight:  246 lb 0.5 oz (111.6 kg)  245 lb (111.131 kg)  SpO2:  100% 98% 100%     Intake/Output Summary (Last 24 hours) at 02/14/14 0739 Last data filed at 02/14/14 0610  Gross per 24 hour  Intake    243 ml  Output   2500 ml  Net  -2257 ml    LABS: Basic Metabolic Panel:  Recent Labs  02/13/14 0405  NA 138  K 4.3  CL 98  CO2 24  GLUCOSE 110*  BUN 54*  CREATININE 1.79*  CALCIUM 7.8*   Liver Function Tests:  Recent Labs  02/13/14 0405  AST 34  ALT 58*  ALKPHOS 91  BILITOT 0.8  PROT 6.0  ALBUMIN 3.1*   No results for input(s): LIPASE, AMYLASE in the last 72 hours. CBC: No results for input(s): WBC, NEUTROABS, HGB, HCT, MCV, PLT in the last 72 hours. Cardiac Enzymes: No results for input(s): CKTOTAL, CKMB, CKMBINDEX, TROPONINI in the last 72 hours. BNP: Invalid input(s): POCBNP D-Dimer: No results for input(s): DDIMER in the last 72 hours. Hemoglobin A1C: No results for input(s): HGBA1C in the last 72 hours. Fasting Lipid Panel: No results for input(s): CHOL, HDL, LDLCALC, TRIG, CHOLHDL, LDLDIRECT in the last 72 hours. Thyroid Function Tests: No results for input(s): TSH, T4TOTAL, T3FREE, THYROIDAB in the last 72 hours.  Invalid input(s): FREET3  RADIOLOGY: Dg Chest 2 View  02/08/2014   CLINICAL DATA:  Weakness.  Shortness of breath.  EXAM: CHEST  2 VIEW  COMPARISON:  10/01/2009  FINDINGS: Low lung volumes are present, causing crowding of the pulmonary  vasculature. Mildly enlarged cardiopericardial silhouette. Tortuous thoracic aorta. Mild airway thickening. Mildly thickened minor fissure.  IMPRESSION: 1. Airway thickening is present, suggesting bronchitis or reactive airways disease. 2. Enlarged cardiopericardial silhouette without overt edema, although there is some trace fluid in the minor fissure.   Electronically Signed   By: Sherryl Barters M.D.   On: 02/08/2014 17:41    PHYSICAL EXAM   the patient is oriented. He says that he has some discomfort in his knees. Lungs reveal scattered rhonchi. Cardiac exam reveals distant heart sounds. The abdomen is soft. He still has significant edema.   TELEMETRY: I have reviewed telemetry today February 14, 2014. There is normal sinus rhythm.   ASSESSMENT AND PLAN:    Acute systolic CHF (congestive heart failure)     The patient still has edema. He is total body volume overloaded. He continues to diurese . His renal function has been improving but I do not see labs yet from today. I would continue his diuresis watching his renal function.     Gait abnormality   SOB (shortness of breath)   Essential hypertension   AKI (acute kidney injury)   Arthritis involving multiple sites   Elevated transaminase level   Dyspnea  Alcohol abuse     He is currently wearing mittens. The primary team will be watching carefully for the possibility of full blown DTs.   Dola Argyle 02/14/2014 7:39 AM

## 2014-02-14 NOTE — Progress Notes (Signed)
PROGRESS NOTE  Jesse Allen SWN:[id number] DOB: 04-11-1925 DOA: 02/08/2014 PCP: No primary care provider on file.  HPI: Jesse Allen is a 80 y.o. male with history of CHF, HTN, Prostate Cancer, Arthritis and Gout who presents to the ED with complaints of worsening SOB and DOE and orthopnea x 6 weeks. He reports that he takes Lasix once daily. He also reports having increased pain in both of his hips and in his knees and has difficulty walking due to pain. He reports that he has fallen twice within the past 2 months. He was evaluated in the ED and was found to have a [place] BNP of 62377.0.  Subjective/ 24 H Interval events No new complaints. He is cheerful. No new complaints. Off oxygen. He was slightly agitated last nigh and was put on mittens. This am, he is requesting for alcohol. Plan for D/C probably in am.   Assessment/Plan: Principal Problem:   Acute systolic CHF (congestive heart failure) Active Problems:   Gait abnormality   SOB (shortness of breath)   Essential hypertension   AKI (acute kidney injury)   Arthritis involving multiple sites   Elevated transaminase level   Dyspnea   Alcohol abuse   Acute on chronic systolic CHF (congestive heart failure) - most recent 2D echo was in 2011 with EF 40-45%, repeat echo yesterday done showed an ejection fraction of 35%  - he is not following regularly with MDs and lives by himself Started on IV lasix, transitioned to po lasix by cardiology.  patient with evidence of fluid overload, daily weights, strict I&Os Cardiology consulted and recommendations given.  -I/O last 3 completed shifts: In: 243 [P.O.:240; I.V.:3] Out: 3075 [Urine:3075]       SOB (shortness of breath) - due to #1,  Lasix - His breathing has improved with diuresis   AKI (acute kidney injury) on probable chronic renal disease - most recent creatinine normal in 2014 - endorses using NSAIDs for his arthritis  - avoid ACEI - His renal function is stable  with ongoing diuresis, continue to monitor with repeat bmp.   Elevated transaminase level - in a pattern of ETOH use, monitor, improving   Essential hypertension - Lasix, Carvedilol  Arthritis involving multiple sites with gait abnormalities - Oxycodone QID PRN - PT consult  Alcohol abuse - patient drinks daily, when asked what he uses for pain for his arthritis, states "alcohol". ETOH elevated on admission - started CIWA, not triggering as much - reports neuropathy, B12 normal   Diet: heart Fluids: none  DVT Prophylaxis: heparin  Code Status: Full Family Communication: none  Disposition Plan: inpatient, SNF when ready, social work consulted   Consultants:  Cardiology  Procedures:  2D echo pending - Left ventricle: Akinesis inferior wall. Severe hypokinesis inferolateral wall. Moderate hypokinesis anterolateral wall. The cavity size was mildly dilated. Wall thickness was increased in a pattern of mild LVH. The estimated ejection fraction was 35%.  - Aortic valve: Sclerosis without stenosis. There was mild regurgitation. - Aorta: Mild root dilitation. Aortic root dimension: 41 mm (ED). - Mitral valve: Mildly thickened leaflets . There was moderate to severe regurgitation. - Left atrium: The atrium was moderately to severely dilated. - Right ventricle: The cavity size was moderately dilated. Systolic function was moderately reduced. - Right atrium: The atrium was moderately to severely dilated. - Tricuspid valve: There was moderate-severe regurgitation. - Pulmonary arteries: PA peak pressure: 65 mm Hg (S).    Antibiotics None    Studies  No results found.   Objective  Filed Vitals:   02/13/14 0411 02/14/14 0013 02/14/14 0454 02/14/14 1251  BP: 157/88 137/61 138/65 146/76  Pulse: 67 64 52 62  Temp: 97.8 F (36.6 C) 97.8 F (36.6 C) 97.6 F (36.4 C) 98 F (36.7 C)  TempSrc: Oral Oral Oral Oral  Resp: 20 18 20 20   Height:      Weight: 111.6 kg (246 lb  0.5 oz)  111.131 kg (245 lb)   SpO2: 100% 98% 100% 100%    Intake/Output Summary (Last 24 hours) at 02/14/14 1310 Last data filed at 02/14/14 0610  Gross per 24 hour  Intake      0 ml  Output   2500 ml  Net  -2500 ml   Filed Weights   02/12/14 0511 02/13/14 0411 02/14/14 0454  Weight: 111.313 kg (245 lb 6.4 oz) 111.6 kg (246 lb 0.5 oz) 111.131 kg (245 lb)    Exam:  General:  NAD  Cardiovascular: RRR  Respiratory: CTA biL  Abdomen: soft, non tender  MSK: 2+ pitting edema, chronic venous stasis changes bilateral LE  Neuro: non focal  Data Reviewed: Basic Metabolic Panel:  Recent Labs Lab 02/09/14 0505 02/10/14 0500 02/11/14 0440 02/13/14 0405 02/14/14 0939  NA 144 138 140 138 133*  K 4.2 4.1 4.1 4.3 4.6  CL 102 98 98 98 94*  CO2 19 22 24 24 24   GLUCOSE 91 140* 137* 110* 120*  BUN 44* 54* 57* 54* 45*  CREATININE 2.52* 2.52* 2.35* 1.79* 1.63*  CALCIUM 7.8* 7.5* 7.6* 7.8* 7.8*   Liver Function Tests:  Recent Labs Lab 02/08/14 1655 02/10/14 0500 02/13/14 0405  AST 206* 64* 34  ALT 136* 91* 58*  ALKPHOS 95 94 91  BILITOT 1.4* 1.0 0.8  PROT 6.5 5.7* 6.0  ALBUMIN 3.5 3.0* 3.1*   CBC:  Recent Labs Lab 02/08/14 1655 02/10/14 0500  WBC 8.5 6.4  NEUTROABS 6.7  --   HGB 14.1 12.8*  HCT 42.3 38.8*  MCV 97.5 97.5  PLT 208 189   Cardiac Enzymes:  Recent Labs Lab 02/08/14 1655  TROPONINI <0.30   BNP (last 3 results)  Recent Labs  02/08/14 1655 02/13/14 0405  PROBNP 62377.0* 23222.0*     Scheduled Meds: . antiseptic oral rinse  7 mL Mouth Rinse BID  . aspirin EC  81 mg Oral Daily  . folic acid  1 mg Oral Daily  . furosemide  80 mg Oral Daily  . heparin subcutaneous  5,000 Units Subcutaneous 3 times per day  . isosorbide-hydrALAZINE  1 tablet Oral TID  . multivitamin with minerals  1 tablet Oral Daily  . sodium chloride  3 mL Intravenous Q12H  . thiamine  100 mg Oral Daily   Or  . thiamine  100 mg Intravenous Daily   Continuous  Infusions:   Time spent: 15 minutes  Hosie Poisson, MD [facility] Pager [phone] 7 PM - 7 AM, please contact night-coverage at [email or web address], password [facility] 02/14/2014, 1:10 PM  LOS: 6 days

## 2014-02-14 NOTE — Progress Notes (Signed)
Pt feels better this morning. Mitts removed. Pt alert to himself and knows he is in the hospital. He is unsure of which state. Pt does not complain of any pain this morning. He is ready to go home.

## 2014-02-14 NOTE — Plan of Care (Signed)
Problem: Phase I Progression Outcomes Goal: EF % per last Echo/documented,Core Reminder form on chart Outcome: Completed/Met Date Met:  02/14/14 Goal: Up in chair, BRP Outcome: Completed/Met Date Met:  02/14/14 Goal: Voiding-avoid urinary catheter unless indicated Outcome: Completed/Met Date Met:  02/14/14

## 2014-02-14 NOTE — Progress Notes (Signed)
Patient had 5 runs of V-Tach, patient denies any pain/distress, BP 148/71, 78, 18, 99%-RA, Dr. Karleen Hampshire notified, stated patient is asymptomatic, will continue to monitor patient.

## 2014-02-15 LAB — BASIC METABOLIC PANEL
Anion gap: 13 (ref 5–15)
BUN: 38 mg/dL — ABNORMAL HIGH (ref 6–23)
CALCIUM: 7.7 mg/dL — AB (ref 8.4–10.5)
CO2: 28 meq/L (ref 19–32)
CREATININE: 1.59 mg/dL — AB (ref 0.50–1.35)
Chloride: 97 mEq/L (ref 96–112)
GFR calc non Af Amer: 38 mL/min — ABNORMAL LOW (ref >90)
GFR, EST AFRICAN AMERICAN: 44 mL/min — AB (ref >90)
Glucose, Bld: 90 mg/dL (ref 70–99)
Potassium: 3.8 mEq/L (ref 3.7–5.3)
Sodium: 138 mEq/L (ref 137–147)

## 2014-02-15 LAB — CBC
HEMATOCRIT: 35.3 % — AB (ref 39.0–52.0)
Hemoglobin: 11.8 g/dL — ABNORMAL LOW (ref 13.0–17.0)
MCH: 32.3 pg (ref 26.0–34.0)
MCHC: 33.4 g/dL (ref 30.0–36.0)
MCV: 96.7 fL (ref 78.0–100.0)
Platelets: 198 10*3/uL (ref 150–400)
RBC: 3.65 MIL/uL — ABNORMAL LOW (ref 4.22–5.81)
RDW: 17.1 % — ABNORMAL HIGH (ref 11.5–15.5)
WBC: 5.3 10*3/uL (ref 4.0–10.5)

## 2014-02-15 MED ORDER — CARVEDILOL 3.125 MG PO TABS
3.1250 mg | ORAL_TABLET | Freq: Two times a day (BID) | ORAL | Status: DC
  Administered 2014-02-15 – 2014-02-16 (×3): 3.125 mg via ORAL
  Filled 2014-02-15 (×3): qty 1

## 2014-02-15 NOTE — Plan of Care (Signed)
Problem: Phase III Progression Outcomes Goal: Activity at appropriate level-compared to baseline (UP IN CHAIR FOR HEMODIALYSIS)  Outcome: Progressing Goal: Voiding independently Outcome: Progressing Goal: Discharge plan remains appropriate-arrangements made Outcome: Progressing

## 2014-02-15 NOTE — Progress Notes (Signed)
Patient ID: Jesse Allen, male   DOB: 03-24-1925, 80 y.o.   MRN: [id number]    SUBJECTIVE:  Patient is comfortable in bed today. He no longer has protective mittens. He says that he is turning over in stone in his life today. He is diuresing well.   Filed Vitals:   02/14/14 1251 02/14/14 1715 02/14/14 2129 02/15/14 0423  BP: 146/76 148/71 142/67 143/72  Pulse: 62 78 57 68  Temp: 98 F (36.7 C) 98.7 F (37.1 C) 97.9 F (36.6 C) 97.6 F (36.4 C)  TempSrc: Oral Axillary Oral Axillary  Resp: 20 18 20 18   Height:      Weight:      SpO2: 100% 99% 98% 97%     Intake/Output Summary (Last 24 hours) at 02/15/14 0746 Last data filed at 02/15/14 0227  Gross per 24 hour  Intake    240 ml  Output   2800 ml  Net  -2560 ml    LABS: Basic Metabolic Panel:  Recent Labs  02/14/14 0939 02/15/14 0521  NA 133* 138  K 4.6 3.8  CL 94* 97  CO2 24 28  GLUCOSE 120* 90  BUN 45* 38*  CREATININE 1.63* 1.59*  CALCIUM 7.8* 7.7*   Liver Function Tests:  Recent Labs  02/13/14 0405  AST 34  ALT 58*  ALKPHOS 91  BILITOT 0.8  PROT 6.0  ALBUMIN 3.1*   No results for input(s): LIPASE, AMYLASE in the last 72 hours. CBC:  Recent Labs  02/15/14 0521  WBC 5.3  HGB 11.8*  HCT 35.3*  MCV 96.7  PLT 198   Cardiac Enzymes: No results for input(s): CKTOTAL, CKMB, CKMBINDEX, TROPONINI in the last 72 hours. BNP: Invalid input(s): POCBNP D-Dimer: No results for input(s): DDIMER in the last 72 hours. Hemoglobin A1C: No results for input(s): HGBA1C in the last 72 hours. Fasting Lipid Panel: No results for input(s): CHOL, HDL, LDLCALC, TRIG, CHOLHDL, LDLDIRECT in the last 72 hours. Thyroid Function Tests: No results for input(s): TSH, T4TOTAL, T3FREE, THYROIDAB in the last 72 hours.  Invalid input(s): FREET3  RADIOLOGY: Dg Chest 2 View  02/08/2014   CLINICAL DATA:  Weakness.  Shortness of breath.  EXAM: CHEST  2 VIEW  COMPARISON:  10/01/2009  FINDINGS: Low lung volumes are present,  causing crowding of the pulmonary vasculature. Mildly enlarged cardiopericardial silhouette. Tortuous thoracic aorta. Mild airway thickening. Mildly thickened minor fissure.  IMPRESSION: 1. Airway thickening is present, suggesting bronchitis or reactive airways disease. 2. Enlarged cardiopericardial silhouette without overt edema, although there is some trace fluid in the minor fissure.   Electronically Signed   By: Sherryl Barters M.D.   On: 02/08/2014 17:41    PHYSICAL EXAM   patient is oriented to person time and place. Affect is normal today. He is lying flat in bed. Head is atraumatic. Sclerae and conjunctiva are normal. Lungs reveal scattered rhonchi. Cardiac exam reveals S1 and S2. Abdomen is soft. He has trace edema but it is definitely decreased since yesterday.   TELEMETRY:  I have reviewed telemetry today February 15, 2014. There is normal sinus rhythm. There is a 3 beat run of ventricular tachycardia.   ASSESSMENT AND PLAN:    Acute systolic CHF (congestive heart failure)     He is diuresing nicely. Plan to continue his oral Lasix. Plan to watch his renal function carefully. If there is any bump upward and his BUN and creatinine, reduce his Lasix dose. 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 I'll add low dose carvedilol today.    AKI (acute kidney injury)      Renal function continues to improve with diuresis. Plan to continue to watch that and adjust the dosing of his Lasix.   Dola Argyle 02/15/2014 7:46 AM

## 2014-02-15 NOTE — Progress Notes (Signed)
PROGRESS NOTE  Jesse Allen ZSW:[id number] DOB: November 27, 1925 DOA: 02/08/2014 PCP: No primary care provider on file.  HPI: Jesse Allen is a 80 y.o. male with history of CHF, HTN, Prostate Cancer, Arthritis and Gout who presents to the ED with complaints of worsening SOB and DOE and orthopnea x 6 weeks. He reports that he takes Lasix once daily. He also reports having increased pain in both of his hips and in his knees and has difficulty walking due to pain. He reports that he has fallen twice within the past 2 months. He was evaluated in the ED and was found to have [place] B[place] of 62377.0.  Subjective/ 24 H Interval events No new complaints. He is cheerful. No new complaints. Off oxygen. He was slightly agitated last nigh and was put on mittens. This am, he is requesting for alcohol. Plan for D/C probably in am.  Few beats of V tach on tele yesterday evening.   Assessment/Plan: Principal Problem:   Acute systolic CHF (congestive heart failure) Active Problems:   Gait abnormality   SOB (shortness of breath)   Essential hypertension   AKI (acute kidney injury)   Arthritis involving multiple sites   Elevated transaminase level   Dyspnea   Alcohol abuse   Acute on chronic systolic CHF (congestive heart failure) - most recent 2D echo was in 2011 with EF 40-45%, repeat echo yesterday done showed an ejection fraction of 35%  - he is not following regularly with MDs and lives by himself Started on IV lasix, transitioned to po lasix by cardiology.  patient with evidence of fluid overload, daily weights, strict I&Os Cardiology consulted and recommendations given.  -I/O last 3 completed shifts: In: 240 [P.O.:240] Out: 4700 [Urine:4700] Total I/O In: 243 [P.O.:240; I.V.:3] Out: -      SOB (shortness of breath) - due to #1,  Lasix - His breathing has improved with diuresis   AKI (acute kidney injury) on probable chronic renal disease - most recent creatinine normal in 2014 -  endorses using NSAIDs for his arthritis  - avoid ACEI - His renal function is stable with ongoing diuresis, continue to monitor with repeat bmp.   Elevated transaminase level - in a pattern of ETOH use, monitor, improving   Essential hypertension - Lasix, Carvedilol  Arthritis involving multiple sites with gait abnormalities - Oxycodone QID PRN - PT consult  Alcohol abuse - patient drinks daily, when asked what he uses for pain for his arthritis, states "alcohol". ETOH elevated on admission - started CIWA, not triggering as much - reports neuropathy, B12 normal   Diet: heart Fluids: none  DVT Prophylaxis: heparin  Code Status: Full Family Communication: none  Disposition Plan: inpatient, SNF when ready, social work consulted   Consultants:  Cardiology  Procedures:  2D echo pending - Left ventricle: Akinesis inferior wall. Severe hypokinesis inferolateral wall. Moderate hypokinesis anterolateral wall. The cavity size was mildly dilated. Wall thickness was increased in a pattern of mild LVH. The estimated ejection fraction was 35%.  - Aortic valve: Sclerosis without stenosis. There was mild regurgitation. - Aorta: Mild root dilitation. Aortic root dimension: 41 mm (ED). - Mitral valve: Mildly thickened leaflets . There was moderate to severe regurgitation. - Left atrium: The atrium was moderately to severely dilated. - Right ventricle: The cavity size was moderately dilated. Systolic function was moderately reduced. - Right atrium: The atrium was moderately to severely dilated. - Tricuspid valve: There was moderate-severe regurgitation. - Pulmonary arteries: PA  peak pressure: 65 mm Hg (S).    Antibiotics None    Studies  No results found.   Objective  Filed Vitals:   02/14/14 2129 02/15/14 0423 02/15/14 0910 02/15/14 0917  BP: 142/67 143/72 150/48   Pulse: 57 68 75   Temp: 97.9 F (36.6 C) 97.6 F (36.4 C)    TempSrc: Oral Axillary    Resp: 20 18      Height:      Weight:    108.682 kg (239 lb 9.6 oz)  SpO2: 98% 97%      Intake/Output Summary (Last 24 hours) at 02/15/14 1215 Last data filed at 02/15/14 1011  Gross per 24 hour  Intake    483 ml  Output   2800 ml  Net  -2317 ml   Filed Weights   02/13/14 0411 02/14/14 0454 02/15/14 0917  Weight: 111.6 kg (246 lb 0.5 oz) 111.131 kg (245 lb) 108.682 kg (239 lb 9.6 oz)    Exam:  General:  NAD  Cardiovascular: RRR  Respiratory: CTA biL  Abdomen: soft, non tender  MSK: 2+ pitting edema, chronic venous stasis changes bilateral LE  Neuro: non focal  Data Reviewed: Basic Metabolic Panel:  Recent Labs Lab 02/10/14 0500 02/11/14 0440 02/13/14 0405 02/14/14 0939 02/15/14 0521  NA 138 140 138 133* 138  K 4.1 4.1 4.3 4.6 3.8  CL 98 98 98 94* 97  CO2 22 24 24 24 28   GLUCOSE 140* 137* 110* 120* 90  BUN 54* 57* 54* 45* 38*  CREATININE 2.52* 2.35* 1.79* 1.63* 1.59*  CALCIUM 7.5* 7.6* 7.8* 7.8* 7.7*   Liver Function Tests:  Recent Labs Lab 02/08/14 1655 02/10/14 0500 02/13/14 0405  AST 206* 64* 34  ALT 136* 91* 58*  ALKPHOS 95 94 91  BILITOT 1.4* 1.0 0.8  PROT 6.5 5.7* 6.0  ALBUMIN 3.5 3.0* 3.1*   CBC:  Recent Labs Lab 02/08/14 1655 02/10/14 0500 02/15/14 0521  WBC 8.5 6.4 5.3  NEUTROABS 6.7  --   --   HGB 14.1 12.8* 11.8*  HCT 42.3 38.8* 35.3*  MCV 97.5 97.5 96.7  PLT 208 189 198   Cardiac Enzymes:  Recent Labs Lab 02/08/14 1655  TROPONINI <0.30   BNP (last 3 results)  Recent Labs  02/08/14 1655 02/13/14 0405  PROBNP 62377.0* 23222.0*     Scheduled Meds: . antiseptic oral rinse  7 mL Mouth Rinse BID  . aspirin EC  81 mg Oral Daily  . carvedilol  3.125 mg Oral BID WC  . folic acid  1 mg Oral Daily  . furosemide  80 mg Oral Daily  . heparin subcutaneous  5,000 Units Subcutaneous 3 times per day  . isosorbide-hydrALAZINE  1 tablet Oral TID  . multivitamin with minerals  1 tablet Oral Daily  . sodium chloride  3 mL Intravenous Q12H   . thiamine  100 mg Oral Daily   Or  . thiamine  100 mg Intravenous Daily   Continuous Infusions:   Time spent: 15 minutes  Hosie Poisson, MD [facility] Pager [phone] 7 PM - 7 AM, please contact night-coverage at [email or web address], password [facility] 02/15/2014, 12:15 PM  LOS: 7 days

## 2014-02-16 DIAGNOSIS — R0600 Dyspnea, unspecified: Secondary | ICD-10-CM

## 2014-02-16 LAB — BASIC METABOLIC PANEL
Anion gap: 12 (ref 5–15)
BUN: 36 mg/dL — AB (ref 6–23)
CALCIUM: 7.7 mg/dL — AB (ref 8.4–10.5)
CO2: 29 mEq/L (ref 19–32)
Chloride: 95 mEq/L — ABNORMAL LOW (ref 96–112)
Creatinine, Ser: 1.63 mg/dL — ABNORMAL HIGH (ref 0.50–1.35)
GFR calc Af Amer: 42 mL/min — ABNORMAL LOW (ref >90)
GFR, EST NON AFRICAN AMERICAN: 37 mL/min — AB (ref >90)
Glucose, Bld: 101 mg/dL — ABNORMAL HIGH (ref 70–99)
Potassium: 3.8 mEq/L (ref 3.7–5.3)
Sodium: 136 mEq/L — ABNORMAL LOW (ref 137–147)

## 2014-02-16 MED ORDER — CARVEDILOL 3.125 MG PO TABS
3.1250 mg | ORAL_TABLET | Freq: Two times a day (BID) | ORAL | Status: DC
Start: 2014-02-16 — End: 2014-05-02

## 2014-02-16 MED ORDER — FUROSEMIDE 40 MG PO TABS: 40.0000 mg | ORAL_TABLET | Freq: Every day | ORAL | Status: DC

## 2014-02-16 MED ORDER — FOLIC ACID 1 MG PO TABS: 1.0000 mg | ORAL_TABLET | Freq: Every day | ORAL | Status: DC

## 2014-02-16 MED ORDER — FUROSEMIDE 40 MG PO TABS
40.0000 mg | ORAL_TABLET | Freq: Every day | ORAL | Status: DC
  Administered 2014-02-16: 40 mg via ORAL
  Filled 2014-02-16: qty 1

## 2014-02-16 NOTE — Progress Notes (Signed)
Patient ID: Jesse Allen, male   DOB: 1925-03-26, 80 y.o.   MRN: [id number]      SUBJECTIVE:  Patient is comfortable in bed today. He has no complains.    Marland Kitchen antiseptic oral rinse  7 mL Mouth Rinse BID  . aspirin EC  81 mg Oral Daily  . carvedilol  3.125 mg Oral BID WC  . folic acid  1 mg Oral Daily  . furosemide  80 mg Oral Daily  . heparin subcutaneous  5,000 Units Subcutaneous 3 times per day  . isosorbide-hydrALAZINE  1 tablet Oral TID  . multivitamin with minerals  1 tablet Oral Daily  . sodium chloride  3 mL Intravenous Q12H  . thiamine  100 mg Oral Daily   Or  . thiamine  100 mg Intravenous Daily     Filed Vitals:   02/15/14 1629 02/15/14 2148 02/16/14 0529 02/16/14 0732  BP: 137/68 139/70 137/75 141/73  Pulse: 62 60 60 64  Temp:  97.5 F (36.4 C) 97.5 F (36.4 C)   TempSrc:  Oral Oral   Resp:  18 18   Height:      Weight:   243 lb 6.4 oz (110.406 kg)   SpO2:  100% 100%      Intake/Output Summary (Last 24 hours) at 02/16/14 0738 Last data filed at 02/15/14 1803  Gross per 24 hour  Intake    603 ml  Output    600 ml  Net      3 ml    LABS: Basic Metabolic Panel:  Recent Labs  02/15/14 0521 02/16/14 0426  NA 138 136*  K 3.8 3.8  CL 97 95*  CO2 28 29  GLUCOSE 90 101*  BUN 38* 36*  CREATININE 1.59* 1.63*  CALCIUM 7.7* 7.7*   Liver Function Tests: No results for input(s): AST, ALT, ALKPHOS, BILITOT, PROT, ALBUMIN in the last 72 hours. No results for input(s): LIPASE, AMYLASE in the last 72 hours. CBC:  Recent Labs  02/15/14 0521  WBC 5.3  HGB 11.8*  HCT 35.3*  MCV 96.7  PLT 198   Cardiac Enzymes: No results for input(s): CKTOTAL, CKMB, CKMBINDEX, TROPONINI in the last 72 hours. BNP: Invalid input(s): POCBNP D-Dimer: No results for input(s): DDIMER in the last 72 hours. Hemoglobin A1C: No results for input(s): HGBA1C in the last 72 hours. Fasting Lipid Panel: No results for input(s): CHOL, HDL, LDLCALC, TRIG, CHOLHDL, LDLDIRECT in the  last 72 hours. Thyroid Function Tests: No results for input(s): TSH, T4TOTAL, T3FREE, THYROIDAB in the last 72 hours.  Invalid input(s): FREET3  RADIOLOGY: Dg Chest 2 View  02/08/2014   CLINICAL DATA:  Weakness.  Shortness of breath.  EXAM: CHEST  2 VIEW  COMPARISON:  10/01/2009  FINDINGS: Low lung volumes are present, causing crowding of the pulmonary vasculature. Mildly enlarged cardiopericardial silhouette. Tortuous thoracic aorta. Mild airway thickening. Mildly thickened minor fissure.  IMPRESSION: 1. Airway thickening is present, suggesting bronchitis or reactive airways disease. 2. Enlarged cardiopericardial silhouette without overt edema, although there is some trace fluid in the minor fissure.   Electronically Signed   By: Sherryl Barters M.D.   On: 02/08/2014 17:41    PHYSICAL EXAM   patient is oriented to person time and place. Affect is normal today. He is lying flat in bed. Head is atraumatic. Sclerae and conjunctiva are normal. Lungs reveal scattered rhonchi. Cardiac exam reveals S1 and S2. Abdomen is soft. He has trace edema but it is definitely decreased since  yesterday.   TELEMETRY:  I have reviewed telemetry today February 15, 2014. There is normal sinus rhythm. There is a 3 beat run of ventricular tachycardia.   ASSESSMENT AND PLAN:    Acute systolic CHF (congestive heart failure)     He has diuresed significantly, now appears almost euvolemic and Crea trending up, I would recommend to cut Lasix dose in half to 40 mg po daily and follow Crea closely. He is in SR with very frequent PACs and some PVCs. Continue the same low dose of carvedilol, there is no much room for uptitration.    AKI (acute kidney injury)      Decrease lasix dose to 40 mg po daily.    Dorothy Spark, MD 02/16/2014

## 2014-02-16 NOTE — Plan of Care (Signed)
Problem: Phase III Progression Outcomes Goal: Activity at appropriate level-compared to baseline (UP IN CHAIR FOR HEMODIALYSIS)  Outcome: Completed/Met Date Met:  02/16/14     

## 2014-02-16 NOTE — Plan of Care (Signed)
Problem: Discharge Progression Outcomes Goal: Activity appropriate for discharge plan Outcome: Completed/Met Date Met:  02/16/14     

## 2014-02-16 NOTE — Plan of Care (Signed)
Problem: Consults Goal: General Medical Patient Education See Patient Education Module for specific education.  Outcome: Completed/Met Date Met:  02/16/14 Goal: Skin Care Protocol Initiated - if Braden Score 18 or less If consults are not indicated, leave blank or document N/A  Outcome: Completed/Met Date Met:  02/16/14 Goal: Nutrition Consult-if indicated Outcome: Not Applicable Date Met:  56/43/32 Goal: Diabetes Guidelines if Diabetic/Glucose > 140 If diabetic or lab glucose is > 140 mg/dl - Initiate Diabetes/Hyperglycemia Guidelines & Document Interventions  Outcome: Not Applicable Date Met:  95/18/84  Problem: Phase I Progression Outcomes Goal: OOB as tolerated unless otherwise ordered Outcome: Completed/Met Date Met:  02/16/14  Problem: Phase III Progression Outcomes Goal: Voiding independently Outcome: Completed/Met Date Met:  02/16/14 Goal: Discharge plan remains appropriate-arrangements made Outcome: Completed/Met Date Met:  02/16/14  Problem: Discharge Progression Outcomes Goal: Discharge plan in place and appropriate Outcome: Completed/Met Date Met:  16/60/63 Goal: Complications resolved/controlled Outcome: Completed/Met Date Met:  02/16/14 Goal: Other Discharge Outcomes/Goals Outcome: Completed/Met Date Met:  02/16/14  Problem: Consults Goal: Heart Failure Patient Education (See Patient Education module for education specifics.)  Outcome: Completed/Met Date Met:  02/16/14 Goal: Skin Care Protocol Initiated - if Braden Score 18 or less If consults are not indicated, leave blank or document N/A  Outcome: Completed/Met Date Met:  02/16/14 Goal: Tobacco Cessation referral if indicated Outcome: Not Applicable Date Met:  01/60/10 Goal: Nutrition Consult-if indicated Outcome: Not Applicable Date Met:  93/23/55 Goal: Diabetes Guidelines if Diabetic/Glucose > 140 If diabetic or lab glucose is > 140 mg/dl - Initiate Diabetes/Hyperglycemia Guidelines & Document  Interventions  Outcome: Not Applicable Date Met:  73/22/02  Problem: Phase I Progression Outcomes Goal: Dyspnea controlled at rest (HF) Outcome: Completed/Met Date Met:  02/16/14 Goal: Initial discharge plan identified Outcome: Completed/Met Date Met:  02/16/14 Goal: Hemodynamically stable Outcome: Completed/Met Date Met:  02/16/14 Goal: Other Phase I Outcomes/Goals Outcome: Completed/Met Date Met:  02/16/14  Problem: Phase II Progression Outcomes Goal: Pain controlled Outcome: Completed/Met Date Met:  02/16/14 Goal: Dyspnea controlled with activity Outcome: Completed/Met Date Met:  02/16/14 Goal: Walk in hall or up in chair TID Outcome: Completed/Met Date Met:  02/16/14 Goal: Discharge plan established Outcome: Completed/Met Date Met:  02/16/14 Goal: Tolerating diet Outcome: Completed/Met Date Met:  02/16/14 Goal: Fluid volume status improved Outcome: Completed/Met Date Met:  02/16/14 Goal: Case manager referral Outcome: Completed/Met Date Met:  02/16/14 Goal: Begin discharge teaching Outcome: Completed/Met Date Met:  02/16/14 Goal: Other Phase II Outcomes/Goals Outcome: Completed/Met Date Met:  02/16/14  Problem: Phase III Progression Outcomes Goal: Pain controlled on oral analgesia Outcome: Completed/Met Date Met:  02/16/14 Goal: Activity at appropriate level-compared to baseline (UP IN CHAIR FOR HEMODIALYSIS)  Outcome: Completed/Met Date Met:  02/16/14 Goal: Tolerating diet Outcome: Completed/Met Date Met:  02/16/14 Goal: Dyspnea controlled with activity Outcome: Completed/Met Date Met:  02/16/14 Goal: Discharge plan remains appropriate-arrangements made Outcome: Completed/Met Date Met:  02/16/14 Goal: Fluid volume status improved Outcome: Completed/Met Date Met:  02/16/14 Goal: Other Phase III Outcomes/Goals Outcome: Completed/Met Date Met:  02/16/14  Problem: Discharge Progression Outcomes Goal: Barriers To Progression Addressed/Resolved Outcome:  Completed/Met Date Met:  02/16/14 Goal: Able to perform self care activities Outcome: Completed/Met Date Met:  02/16/14 Goal: Discharge plan in place and appropriate Outcome: Completed/Met Date Met:  02/16/14 Goal: If EF < 40% ACEI/ARB addressed at discharge Outcome: Completed/Met Date Met:  02/16/14 Goal: Pain controlled with appropriate interventions Outcome: Completed/Met Date Met:  02/16/14 Goal: Hemodynamically stable Outcome: Completed/Met Date  Met:  34/19/62 Goal: Complications resolved/controlled Outcome: Completed/Met Date Met:  02/16/14 Goal: Tolerating diet Outcome: Completed/Met Date Met:  02/16/14 Goal: Activity appropriate for discharge plan Outcome: Completed/Met Date Met:  02/16/14 Goal: Other Discharge Outcomes/Goals Outcome: Completed/Met Date Met:  02/16/14

## 2014-02-19 ENCOUNTER — Non-Acute Institutional Stay (SKILLED_NURSING_FACILITY): Payer: Medicare Other | Admitting: Internal Medicine

## 2014-02-19 DIAGNOSIS — N179 Acute kidney failure, unspecified: Secondary | ICD-10-CM

## 2014-02-19 DIAGNOSIS — I10 Essential (primary) hypertension: Secondary | ICD-10-CM

## 2014-02-19 DIAGNOSIS — F1010 Alcohol abuse, uncomplicated: Secondary | ICD-10-CM

## 2014-02-19 DIAGNOSIS — I5021 Acute systolic (congestive) heart failure: Secondary | ICD-10-CM

## 2014-02-19 DIAGNOSIS — R740 Nonspecific elevation of levels of transaminase and lactic acid dehydrogenase [LDH]: Secondary | ICD-10-CM

## 2014-02-19 DIAGNOSIS — R0602 Shortness of breath: Secondary | ICD-10-CM

## 2014-02-19 DIAGNOSIS — R7401 Elevation of levels of liver transaminase levels: Secondary | ICD-10-CM

## 2014-02-19 NOTE — Progress Notes (Signed)
MRN: [id number] Name: Jesse Allen  Sex: male Age: 80 y.o. DOB: [birth year]  [place] #: Helene Kelp Facility/Room:216 Level Of Care: SNF Provider: Inocencio Homes D Emergency Contacts: Extended Emergency Contact Information Primary Emergency Contact: Caldwell,Carolyn  [place] Phone: [phone] Relation: Daughter  Code Status: DNR  Allergies: Review of patient's allergies indicates no known allergies.  Chief Complaint  Patient presents with  . New Admit To SNF    HPI: Patient is 80 y.o. male who iadmitted to SNF for OT/PT for generalized weakness after being hosp for acute CHF.  Past Medical History  Diagnosis Date  . Gout   . Hypertension   . CHF (congestive heart failure)     History reviewed. No pertinent past surgical history.    Medication List       This list is accurate as of: 02/19/14 11:59 PM.  Always use your most recent med list.               aspirin 81 MG EC tablet  Take 1 tablet (81 mg total) by mouth daily.     carvedilol 3.125 MG tablet  Commonly known as:  COREG  Take 1 tablet (3.125 mg total) by mouth 2 (two) times daily with a meal.     folic acid 1 MG tablet  Commonly known as:  FOLVITE  Take 1 tablet (1 mg total) by mouth daily.     furosemide 40 MG tablet  Commonly known as:  LASIX  Take 1 tablet (40 mg total) by mouth daily.     gabapentin 100 MG capsule  Commonly known as:  NEURONTIN  Take 1 capsule (100 mg total) by mouth 2 (two) times daily.     isosorbide-hydrALAZINE 20-37.5 MG per tablet  Commonly known as:  BIDIL  Take 1 tablet by mouth 3 (three) times daily.     multivitamin with minerals Tabs tablet  Take 1 tablet by mouth daily.     pantoprazole 40 MG tablet  Commonly known as:  PROTONIX  Take 1 tablet (40 mg total) by mouth daily at 12 noon.     tamsulosin 0.4 MG Caps capsule  Commonly known as:  FLOMAX  Take 1 capsule (0.4 mg total) by mouth daily after supper.        No orders of the  defined types were placed in this encounter.    Immunization History  Administered Date(s) Administered  . Influenza,inj,Quad PF,36+ Mos 02/09/2014  . Pneumococcal Polysaccharide-23 02/09/2014    History  Substance Use Topics  . Smoking status: Never Smoker   . Smokeless tobacco: Not on file  . Alcohol Use: Yes     Comment: today- "a double shot" (occasional use)    Family history is noncontributory    Review of Systems  DATA OBTAINED: from patient GENERAL:  no fevers, fatigue, appetite changes SKIN: No itching, rash or wounds EYES: No eye pain, redness, discharge EARS: No earache, tinnitus, change in hearing NOSE: No congestion, drainage or bleeding  MOUTH/THROAT: No mouth or tooth pain, No sore throat RESPIRATORY: No cough, wheezing, SOB CARDIAC: No chest pain, palpitations, lower extremity edema  GI: No abdominal pain, No N/V/D or constipation, No heartburn or reflux  GU: No dysuria, frequency or urgency, or incontinence  MUSCULOSKELETAL: No unrelieved bone/joint pain NEUROLOGIC: No headache, dizziness or focal weakness PSYCHIATRIC: No overt anxiety or sadness, No behavior issue.   Filed Vitals:   02/19/14 2051  BP: 124/66  Pulse: 60  Temp: 98 F (  36.7 C)  Resp: 20    Physical Exam  GENERAL APPEARANCE: Alert, conversant,  No acute distress.  SKIN: No diaphoresis; dry skin on legs HEAD: Normocephalic, atraumatic  EYES: Conjunctiva/lids clear. Pupils round, reactive. EOMs intact.  EARS: External exam WNL, canals clear. Hearing grossly normal.  NOSE: No deformity or discharge.  MOUTH/THROAT: Lips w/o lesions  RESPIRATORY: Breathing is even, unlabored. Lung sounds are coarse at bases  CARDIOVASCULAR: Heart RRR no murmurs, rubs or gallops. No peripheral edema.   GASTROINTESTINAL: Abdomen is soft, non-tender, not distended w/ normal bowel sounds. GENITOURINARY: Bladder non tender, not distended  MUSCULOSKELETAL: No abnormal joints or musculature NEUROLOGIC:   Cranial nerves 2-12 grossly intact. Moves all extremities  PSYCHIATRIC: Mood and affect appropriate to situation, no behavioral issues  Patient Active Problem List   Diagnosis Date Noted  . Alcohol abuse   . Dyspnea   . Acute CHF 02/08/2014  . Acute systolic CHF (congestive heart failure) 02/08/2014  . SOB (shortness of breath) 02/08/2014  . Essential hypertension 02/08/2014  . AKI (acute kidney injury) 02/08/2014  . Arthritis involving multiple sites 02/08/2014  . Elevated transaminase level 02/08/2014  . Gait abnormality 11/29/2012  . Elevated CPK 11/29/2012  . Musculoskeletal thigh pain 11/29/2012  . Hematuria 11/29/2012  . Gout     CBC    Component Value Date/Time   WBC 5.3 02/15/2014 0521   RBC 3.65* 02/15/2014 0521   HGB 11.8* 02/15/2014 0521   HCT 35.3* 02/15/2014 0521   PLT 198 02/15/2014 0521   MCV 96.7 02/15/2014 0521   LYMPHSABS 0.6* 02/08/2014 1655   MONOABS 1.2* 02/08/2014 1655   EOSABS 0.0 02/08/2014 1655   BASOSABS 0.0 02/08/2014 1655    CMP     Component Value Date/Time   NA 136* 02/16/2014 0426   K 3.8 02/16/2014 0426   CL 95* 02/16/2014 0426   CO2 29 02/16/2014 0426   GLUCOSE 101* 02/16/2014 0426   BUN 36* 02/16/2014 0426   CREATININE 1.63* 02/16/2014 0426   CALCIUM 7.7* 02/16/2014 0426   PROT 6.0 02/13/2014 0405   ALBUMIN 3.1* 02/13/2014 0405   AST 34 02/13/2014 0405   ALT 58* 02/13/2014 0405   ALKPHOS 91 02/13/2014 0405   BILITOT 0.8 02/13/2014 0405   GFRNONAA 37* 02/16/2014 0426   GFRAA 42* 02/16/2014 0426    Assessment and Plan  Acute systolic CHF (congestive heart failure) most recent 2D echo was in 2011 with EF 40-45%, repeat echo yesterday done showed an ejection fraction of 35%  - he is not following regularly with MDs and lives by himself IV lasix ordered and transitioned top o lasix. daily weights, strict I&Os Cardiology consulted and recommendations given.    SOB (shortness of breath) due to #1, IV Lasix changed to po  lasix.  - His breathing has improved with diuresis   AKI (acute kidney injury) most recent creatinine normal in 2014 - endorses using NSAIDs for his arthritis  - avoid ACEI - His renal function is stable with ongoing diuresis, continue to monitor with repeat bmp as outpatient.    Elevated transaminase level - in a pattern of ETOH use, improving   Essential hypertension Cont lasix and bidil  Alcohol abuse patient drinks daily,  - started CIWA,not in withdrawal. - reports neuropathy, B12 normal    Hennie Duos, MD

## 2014-02-26 ENCOUNTER — Encounter: Payer: Self-pay | Admitting: Internal Medicine

## 2014-02-26 NOTE — Assessment & Plan Note (Signed)
-   in a pattern of ETOH use, improving

## 2014-02-26 NOTE — Assessment & Plan Note (Signed)
patient drinks daily,  - started CIWA,not in withdrawal. - reports neuropathy, B12 normal

## 2014-02-26 NOTE — Assessment & Plan Note (Signed)
due to #1, IV Lasix changed to po lasix.  - His breathing has improved with diuresis

## 2014-02-26 NOTE — Assessment & Plan Note (Signed)
most recent 2D echo was in 2011 with EF 40-45%, repeat echo yesterday done showed an ejection fraction of 35%  - he is not following regularly with MDs and lives by himself IV lasix ordered and transitioned top o lasix. daily weights, strict I&Os Cardiology consulted and recommendations given.

## 2014-02-26 NOTE — Assessment & Plan Note (Signed)
Cont lasix and bidil

## 2014-02-26 NOTE — Assessment & Plan Note (Signed)
most recent creatinine normal in 2014 - endorses using NSAIDs for his arthritis  - avoid ACEI - His renal function is stable with ongoing diuresis, continue to monitor with repeat bmp as outpatient.

## 2014-03-06 ENCOUNTER — Non-Acute Institutional Stay (SKILLED_NURSING_FACILITY): Payer: Medicare Other | Admitting: Nurse Practitioner

## 2014-03-06 DIAGNOSIS — M109 Gout, unspecified: Secondary | ICD-10-CM

## 2014-03-06 DIAGNOSIS — M10031 Idiopathic gout, right wrist: Secondary | ICD-10-CM

## 2014-03-06 NOTE — Progress Notes (Signed)
Patient ID: Jesse Allen, male   DOB: 18-Jun-1925, 80 y.o.   MRN: [id number]    Nursing Home Location:  [place] of Service: SNF (31)  PCP: No primary care provider on file.  No Known Allergies  Chief Complaint  Patient presents with  . Acute Visit    wrist red, painful and swollen, hx of gout    HPI:  Patient is a 80 y.o. male seen today at [facility] for acute visit at the request of nursing. Nursing reports pt is complaining of painful wrist with decreased ROM. Nursing notes he has a hx of gout but is not on any medications. Pt reports he has had gout for a long time. Reports right wrist has progressively gotten more tender and swollen.  Review of Systems:  Review of Systems  Constitutional: Negative for activity change, appetite change, fatigue and unexpected weight change.  HENT: Negative for congestion and hearing loss.   Eyes: Negative.   Respiratory: Negative for cough and shortness of breath.   Cardiovascular: Negative for chest pain, palpitations and leg swelling.  Gastrointestinal: Negative for abdominal pain, diarrhea and constipation.  Musculoskeletal: Positive for joint swelling and arthralgias. Negative for myalgias.       Pain, swelling and decrease movement to right wrist  Skin: Negative for color change and wound.  Neurological: Negative for weakness.    Past Medical History  Diagnosis Date  . Gout   . Hypertension   . CHF (congestive heart failure)    No past surgical history on file. Social History:   reports that he has never smoked. He does not have any smokeless tobacco history on file. He reports that he drinks alcohol. He reports that he does not use illicit drugs.  No family history on file.  Medications: Patient's Medications  New Prescriptions   No medications on file  Previous Medications   ASPIRIN EC 81 MG EC TABLET    Take 1 tablet (81 mg total) by mouth daily.   CARVEDILOL (COREG) 3.125 MG  TABLET    Take 1 tablet (3.125 mg total) by mouth 2 (two) times daily with a meal.   FOLIC ACID (FOLVITE) 1 MG TABLET    Take 1 tablet (1 mg total) by mouth daily.   FUROSEMIDE (LASIX) 40 MG TABLET    Take 1 tablet (40 mg total) by mouth daily.   GABAPENTIN (NEURONTIN) 100 MG CAPSULE    Take 1 capsule (100 mg total) by mouth 2 (two) times daily.   ISOSORBIDE-HYDRALAZINE (BIDIL) 20-37.5 MG PER TABLET    Take 1 tablet by mouth 3 (three) times daily.   MULTIPLE VITAMIN (MULTIVITAMIN WITH MINERALS) TABS TABLET    Take 1 tablet by mouth daily.   PANTOPRAZOLE (PROTONIX) 40 MG TABLET    Take 1 tablet (40 mg total) by mouth daily at 12 noon.   TAMSULOSIN (FLOMAX) 0.4 MG CAPS CAPSULE    Take 1 capsule (0.4 mg total) by mouth daily after supper.  Modified Medications   No medications on file  Discontinued Medications   No medications on file     Physical Exam: Filed Vitals:   03/06/14 1659  BP: 148/74  Pulse: 72  Temp: 96.7 F (35.9 C)  Resp: 20    Physical Exam  Constitutional: He is oriented to person, place, and time. He appears well-developed and well-nourished. No distress.  HENT:  Head: Normocephalic and atraumatic.  Mouth/Throat: Oropharynx is clear and moist. No  oropharyngeal exudate.  Eyes: Conjunctivae and EOM are normal. Pupils are equal, round, and reactive to light.  Neck: Normal range of motion. Neck supple.  Cardiovascular: Normal rate, regular rhythm and normal heart sounds.   Pulmonary/Chest: Effort normal and breath sounds normal.  Abdominal: Soft. Bowel sounds are normal.  Musculoskeletal: He exhibits edema and tenderness.       Right wrist: He exhibits decreased range of motion (with redness and warmth), tenderness and swelling.  Neurological: He is alert and oriented to person, place, and time.  Skin: Skin is warm and dry. He is not diaphoretic.  Psychiatric: He has a normal mood and affect.    Labs reviewed: Basic Metabolic Panel:  Recent Labs  02/14/14 0939  02/15/14 0521 02/16/14 0426  NA 133* 138 136*  K 4.6 3.8 3.8  CL 94* 97 95*  CO2 24 28 29   GLUCOSE 120* 90 101*  BUN 45* 38* 36*  CREATININE 1.63* 1.59* 1.63*  CALCIUM 7.8* 7.7* 7.7*   Liver Function Tests:  Recent Labs  02/08/14 1655 02/10/14 0500 02/13/14 0405  AST 206* 64* 34  ALT 136* 91* 58*  ALKPHOS 95 94 91  BILITOT 1.4* 1.0 0.8  PROT 6.5 5.7* 6.0  ALBUMIN 3.5 3.0* 3.1*   No results for input(s): LIPASE, AMYLASE in the last 8760 hours. No results for input(s): AMMONIA in the last 8760 hours. CBC:  Recent Labs  02/08/14 1655 02/10/14 0500 02/15/14 0521  WBC 8.5 6.4 5.3  NEUTROABS 6.7  --   --   HGB 14.1 12.8* 11.8*  HCT 42.3 38.8* 35.3*  MCV 97.5 97.5 96.7  PLT 208 189 198   TSH: No results for input(s): TSH in the last 8760 hours. A1C: Lab Results  Component Value Date   HGBA1C 6.1* 02/09/2014   Lipid Panel: No results for input(s): CHOL, HDL, LDLCALC, TRIG, CHOLHDL, LDLDIRECT in the last 8760 hours.    Assessment/Plan 1. Acute gout of right wrist, unspecified cause -Will treat with colchine 1.2mg  now and repeat 0.6 in 1 hour -then 0.6 mg colchine BID PO starting tomorrow

## 2014-03-17 ENCOUNTER — Non-Acute Institutional Stay (SKILLED_NURSING_FACILITY): Payer: Medicare Other | Admitting: Nurse Practitioner

## 2014-03-17 DIAGNOSIS — I739 Peripheral vascular disease, unspecified: Secondary | ICD-10-CM

## 2014-03-17 DIAGNOSIS — G629 Polyneuropathy, unspecified: Secondary | ICD-10-CM

## 2014-03-17 NOTE — Progress Notes (Signed)
Patient ID: Jesse Allen, male   DOB: [birth year], 80 y.o.   MRN: [id number]    Nursing Home Location:  [place] of Service: SNF (31)  PCP: No primary care provider on file.  No Known Allergies  Chief Complaint  Patient presents with  . Acute Visit    HPI:  Patient is a 80 y.o. male seen today at [facility] for acute visit at the request of nursing. Nursing reports pt is complaining of both feet being numb.  Relates this to poor circulation and has been going on for sometime now. Worse at night and that's when it really bothers him.  Nursing also reports color changes in LE  Review of Systems:  Review of Systems  Constitutional: Negative for activity change, appetite change, fatigue and unexpected weight change.  HENT: Negative for congestion and hearing loss.   Eyes: Negative.   Respiratory: Negative for cough and shortness of breath.   Cardiovascular: Negative for chest pain, palpitations and leg swelling.  Gastrointestinal: Negative for abdominal pain, diarrhea and constipation.  Musculoskeletal: Positive for arthralgias. Negative for myalgias.  Skin: Negative for color change and wound.  Neurological: Negative for weakness.    Past Medical History  Diagnosis Date  . Gout   . Hypertension   . CHF (congestive heart failure)    No past surgical history on file. Social History:   reports that he has never smoked. He does not have any smokeless tobacco history on file. He reports that he drinks alcohol. He reports that he does not use illicit drugs.  No family history on file.  Medications: Patient's Medications  New Prescriptions   No medications on file  Previous Medications   ASPIRIN EC 81 MG EC TABLET    Take 1 tablet (81 mg total) by mouth daily.   CARVEDILOL (COREG) 3.125 MG TABLET    Take 1 tablet (3.125 mg total) by mouth 2 (two) times daily with a meal.   FOLIC ACID (FOLVITE) 1 MG TABLET    Take 1 tablet (1 mg total)  by mouth daily.   FUROSEMIDE (LASIX) 40 MG TABLET    Take 1 tablet (40 mg total) by mouth daily.   GABAPENTIN (NEURONTIN) 100 MG CAPSULE    Take 1 capsule (100 mg total) by mouth 2 (two) times daily.   ISOSORBIDE-HYDRALAZINE (BIDIL) 20-37.5 MG PER TABLET    Take 1 tablet by mouth 3 (three) times daily.   MULTIPLE VITAMIN (MULTIVITAMIN WITH MINERALS) TABS TABLET    Take 1 tablet by mouth daily.   PANTOPRAZOLE (PROTONIX) 40 MG TABLET    Take 1 tablet (40 mg total) by mouth daily at 12 noon.   TAMSULOSIN (FLOMAX) 0.4 MG CAPS CAPSULE    Take 1 capsule (0.4 mg total) by mouth daily after supper.  Modified Medications   No medications on file  Discontinued Medications   No medications on file     Physical Exam: Filed Vitals:   03/17/14 1415  BP: 128/72  Pulse: 64  Temp: 97.1 F (36.2 C)  Resp: 18    Physical Exam  Constitutional: He is oriented to person, place, and time. He appears well-developed and well-nourished. No distress.  HENT:  Head: Normocephalic and atraumatic.  Mouth/Throat: Oropharynx is clear and moist. No oropharyngeal exudate.  Eyes: Conjunctivae and EOM are normal. Pupils are equal, round, and reactive to light.  Neck: Normal range of motion. Neck supple.  Cardiovascular: Normal rate, regular rhythm  and normal heart sounds.   Pulmonary/Chest: Effort normal and breath sounds normal.  Abdominal: Soft. Bowel sounds are normal.  Musculoskeletal: He exhibits tenderness (to LE). Edema: 1+ bilaterally.  Neurological: He is alert and oriented to person, place, and time. No sensory deficit (decreased senstation to bilateral feet).  Skin: Skin is warm and dry. He is not diaphoretic.  Chronic skin color changes in LE  Psychiatric: He has a normal mood and affect.    Labs reviewed: Basic Metabolic Panel:  Recent Labs  02/14/14 0939 02/15/14 0521 02/16/14 0426  NA 133* 138 136*  K 4.6 3.8 3.8  CL 94* 97 95*  CO2 24 28 29   GLUCOSE 120* 90 101*  BUN 45* 38* 36*    CREATININE 1.63* 1.59* 1.63*  CALCIUM 7.8* 7.7* 7.7*   Liver Function Tests:  Recent Labs  02/08/14 1655 02/10/14 0500 02/13/14 0405  AST 206* 64* 34  ALT 136* 91* 58*  ALKPHOS 95 94 91  BILITOT 1.4* 1.0 0.8  PROT 6.5 5.7* 6.0  ALBUMIN 3.5 3.0* 3.1*   No results for input(s): LIPASE, AMYLASE in the last 8760 hours. No results for input(s): AMMONIA in the last 8760 hours. CBC:  Recent Labs  02/08/14 1655 02/10/14 0500 02/15/14 0521  WBC 8.5 6.4 5.3  NEUTROABS 6.7  --   --   HGB 14.1 12.8* 11.8*  HCT 42.3 38.8* 35.3*  MCV 97.5 97.5 96.7  PLT 208 189 198   TSH: No results for input(s): TSH in the last 8760 hours. A1C: Lab Results  Component Value Date   HGBA1C 6.1* 02/09/2014   Lipid Panel: No results for input(s): CHOL, HDL, LDLCALC, TRIG, CHOLHDL, LDLDIRECT in the last 8760 hours.    Assessment/Plan 1. PVD (peripheral vascular disease) conts on ASA, will monitor  2. Neuropathy Will start low dose gabapentin at hs.  -gabapentin 100 mg PO QHS  3. Gout improved

## 2014-04-14 ENCOUNTER — Non-Acute Institutional Stay (SKILLED_NURSING_FACILITY): Payer: Medicare Other | Admitting: Nurse Practitioner

## 2014-04-14 DIAGNOSIS — M129 Arthropathy, unspecified: Secondary | ICD-10-CM

## 2014-04-14 DIAGNOSIS — I5022 Chronic systolic (congestive) heart failure: Secondary | ICD-10-CM

## 2014-04-14 DIAGNOSIS — M109 Gout, unspecified: Secondary | ICD-10-CM

## 2014-04-14 DIAGNOSIS — G629 Polyneuropathy, unspecified: Secondary | ICD-10-CM

## 2014-04-14 DIAGNOSIS — I739 Peripheral vascular disease, unspecified: Secondary | ICD-10-CM

## 2014-04-14 DIAGNOSIS — I10 Essential (primary) hypertension: Secondary | ICD-10-CM

## 2014-04-14 NOTE — Progress Notes (Signed)
Patient ID: Jesse Allen, male   DOB: 03-Aug-1925, 81 y.o.   MRN: [id number]    Nursing Home Location:  [place] of Service: SNF (31)  PCP: No primary care provider on file.  No Known Allergies  Chief Complaint  Patient presents with  . Discharge Note    HPI:  Patient is a 81 y.o. male seen today at [facility] for discharge home. Pt was admitted to [facility] after hospitalization for acute CHF for ongoing PT/OT. Pt with pmh of gout, neuropathy, HTN, CHF, BPH. Patient currently doing well with therapy, now stable to discharge home with home health.    Review of Systems:  Review of Systems  Constitutional: Negative for activity change, appetite change, fatigue and unexpected weight change.  HENT: Negative for congestion and hearing loss.   Eyes: Negative.   Respiratory: Negative for cough and shortness of breath.   Cardiovascular: Negative for chest pain, palpitations and leg swelling.  Gastrointestinal: Negative for abdominal pain, diarrhea and constipation.  Musculoskeletal: Positive for arthralgias. Negative for myalgias.  Skin: Negative for color change and wound.  Neurological: Negative for weakness.    Past Medical History  Diagnosis Date  . Gout   . Hypertension   . CHF (congestive heart failure)    No past surgical history on file. Social History:   reports that he has never smoked. He does not have any smokeless tobacco history on file. He reports that he drinks alcohol. He reports that he does not use illicit drugs.  No family history on file.  Medications: Patient's Medications  New Prescriptions   No medications on file  Previous Medications   ASPIRIN EC 81 MG EC TABLET    Take 1 tablet (81 mg total) by mouth daily.   FOLIC ACID (FOLVITE) 1 MG TABLET    Take 1 tablet (1 mg total) by mouth daily.   FUROSEMIDE (LASIX) 40 MG TABLET    Take 1 tablet (40 mg total) by mouth daily.   GABAPENTIN (NEURONTIN) 100 MG  CAPSULE    Take 1 capsule (100 mg total) by mouth 2 (two) times daily.   ISOSORBIDE-HYDRALAZINE (BIDIL) 20-37.5 MG PER TABLET    Take 1 tablet by mouth 3 (three) times daily.   MULTIPLE VITAMIN (MULTIVITAMIN WITH MINERALS) TABS TABLET    Take 1 tablet by mouth daily.   OMEPRAZOLE (PRILOSEC) 20 MG CAPSULE    Take 20 mg by mouth daily.   PANTOPRAZOLE (PROTONIX) 40 MG TABLET    Take 1 tablet (40 mg total) by mouth daily at 12 noon.   TAMSULOSIN (FLOMAX) 0.4 MG CAPS CAPSULE    Take 1 capsule (0.4 mg total) by mouth daily after supper.  Modified Medications   No medications on file  Discontinued Medications   CARVEDILOL (COREG) 3.125 MG TABLET    Take 1 tablet (3.125 mg total) by mouth 2 (two) times daily with a meal.     Physical Exam: Filed Vitals:   04/14/14 1838  BP: 130/62  Pulse: 68  Temp: 97.6 F (36.4 C)  Resp: 20    Physical Exam  Constitutional: He is oriented to person, place, and time. He appears well-developed and well-nourished. No distress.  HENT:  Head: Normocephalic and atraumatic.  Mouth/Throat: Oropharynx is clear and moist. No oropharyngeal exudate.  Eyes: Conjunctivae and EOM are normal. Pupils are equal, round, and reactive to light.  Neck: Normal range of motion. Neck supple.  Cardiovascular: Normal rate, regular  rhythm and normal heart sounds.   Pulmonary/Chest: Effort normal and breath sounds normal.  Abdominal: Soft. Bowel sounds are normal.  Musculoskeletal: He exhibits no tenderness. Edema: 1+ bilaterally.  Neurological: He is alert and oriented to person, place, and time. No sensory deficit (decreased senstation to bilateral feet).  Skin: Skin is warm and dry. He is not diaphoretic.  Chronic skin color changes in LE  Psychiatric: He has a normal mood and affect.    Labs reviewed: Basic Metabolic Panel:  Recent Labs  02/14/14 0939 02/15/14 0521 02/16/14 0426  NA 133* 138 136*  K 4.6 3.8 3.8  CL 94* 97 95*  CO2 24 28 29   GLUCOSE 120* 90 101*    BUN 45* 38* 36*  CREATININE 1.63* 1.59* 1.63*  CALCIUM 7.8* 7.7* 7.7*   Liver Function Tests:  Recent Labs  02/08/14 1655 02/10/14 0500 02/13/14 0405  AST 206* 64* 34  ALT 136* 91* 58*  ALKPHOS 95 94 91  BILITOT 1.4* 1.0 0.8  PROT 6.5 5.7* 6.0  ALBUMIN 3.5 3.0* 3.1*   No results for input(s): LIPASE, AMYLASE in the last 8760 hours. No results for input(s): AMMONIA in the last 8760 hours. CBC:  Recent Labs  02/08/14 1655 02/10/14 0500 02/15/14 0521  WBC 8.5 6.4 5.3  NEUTROABS 6.7  --   --   HGB 14.1 12.8* 11.8*  HCT 42.3 38.8* 35.3*  MCV 97.5 97.5 96.7  PLT 208 189 198   TSH: No results for input(s): TSH in the last 8760 hours. A1C: Lab Results  Component Value Date   HGBA1C 6.1* 02/09/2014   Lipid Panel: No results for input(s): CHOL, HDL, LDLCALC, TRIG, CHOLHDL, LDLDIRECT in the last 8760 hours.    Assessment/Plan 1. Essential hypertension Stable at this time, conts on bidil TID   2. Arthritis involving multiple sites Stable at this time  3. Gout of right wrist, unspecified cause, unspecified chronicity Pt with gout flare this admission to [facility] which has improved. Started on golcrys 0.6 mg BID to cont at this time.   4. PVD (peripheral vascular disease) -stable. conts on asa  5. Neuropathy conts on gabapentin   6. Chronic systolic heart failure Stable at this time, conts on lasix 80 mg daily    pt is stable for discharge-will need PT/OT per home health. DME needed includes WC and transfer tub bench. Rx written.  will need to follow up with PCP within 2 weeks. Pt also has care aide that will come in 3 times a week to assist him.

## 2014-05-02 MED ORDER — FUROSEMIDE 80 MG PO TABS: 80.0000 mg | ORAL_TABLET | Freq: Every day | ORAL | Status: DC

## 2014-05-02 MED ORDER — COLCHICINE 0.6 MG PO TABS: 0.6000 mg | ORAL_TABLET | Freq: Two times a day (BID) | ORAL | Status: DC

## 2014-05-06 ENCOUNTER — Ambulatory Visit: Payer: Self-pay | Admitting: Internal Medicine

## 2014-05-06 ENCOUNTER — Ambulatory Visit (INDEPENDENT_AMBULATORY_CARE_PROVIDER_SITE_OTHER): Payer: Medicare Other | Admitting: Internal Medicine

## 2014-05-06 VITALS — BP 160/64 | HR 72 | Temp 97.6°F | Resp 20 | Ht 71.0 in | Wt 200.6 lb

## 2014-05-06 DIAGNOSIS — G629 Polyneuropathy, unspecified: Secondary | ICD-10-CM | POA: Diagnosis not present

## 2014-05-06 DIAGNOSIS — I10 Essential (primary) hypertension: Secondary | ICD-10-CM | POA: Diagnosis not present

## 2014-05-06 DIAGNOSIS — I739 Peripheral vascular disease, unspecified: Secondary | ICD-10-CM | POA: Diagnosis not present

## 2014-05-06 DIAGNOSIS — N183 Chronic kidney disease, stage 3 (moderate): Secondary | ICD-10-CM | POA: Diagnosis not present

## 2014-05-06 DIAGNOSIS — I5022 Chronic systolic (congestive) heart failure: Secondary | ICD-10-CM

## 2014-05-06 DIAGNOSIS — M129 Arthropathy, unspecified: Secondary | ICD-10-CM | POA: Diagnosis not present

## 2014-05-06 DIAGNOSIS — F1010 Alcohol abuse, uncomplicated: Secondary | ICD-10-CM | POA: Diagnosis not present

## 2014-05-06 NOTE — Patient Instructions (Signed)
No alcohol intake  Will call with Orthopedic appt to have joint injections  Reduce sodium intake and watch complex carbs  Take tylenol ES for pain. Avoid NSAIDS (aleve, advil, ibuprofen, naprosyn) due to reduced kidney function  Follow up in 1 month to recheck blood pressure

## 2014-05-06 NOTE — Progress Notes (Signed)
Patient ID: Jesse Allen, male   DOB: Nov 13, 1925, 81 y.o.   MRN: [id number]    Facility  [facility]    Place of Service:   OFFICE   No Known Allergies  Chief Complaint  Patient presents with  . Establish Care    knee & hip issues, muscles spasms in thighs    HPI:  81 yo male seen today as a new patient. He c/o knee and hip pain. He has received cortisol injections in the past by ortho Dr Roland Rack which helped x 4 weeks. He has leg and thigh cramps. He has not tried taking a tsp yellow mustard.   His family is present. He is sitting in a w/c.   Active Ambulatory Problems    Diagnosis Date Noted  . Gout   . Gait abnormality 11/29/2012  . Elevated CPK 11/29/2012  . Musculoskeletal thigh pain 11/29/2012  . Hematuria 11/29/2012  . Acute CHF 02/08/2014  . Acute systolic CHF (congestive heart failure) 02/08/2014  . SOB (shortness of breath) 02/08/2014  . Essential hypertension 02/08/2014  . AKI (acute kidney injury) 02/08/2014  . Arthritis involving multiple sites 02/08/2014  . Elevated transaminase level 02/08/2014  . Dyspnea   . Alcohol abuse    Resolved Ambulatory Problems    Diagnosis Date Noted  . No Resolved Ambulatory Problems   Past Medical History  Diagnosis Date  . Hypertension   . CHF (congestive heart failure)    No past surgical history on file. No family history on file.    reports that he has never smoked. He does not have any smokeless tobacco history on file. He reports that he drinks alcohol. He reports that he does not use illicit drugs.  Past medical, surgical, family and social history reviewed  Medications: Patient's Medications  New Prescriptions   No medications on file  Previous Medications   ASPIRIN EC 81 MG EC TABLET    Take 1 tablet (81 mg total) by mouth daily.   COLCHICINE 0.6 MG TABLET    Take 1 tablet (0.6 mg total) by mouth 2 (two) times daily.   FOLIC ACID (FOLVITE) 1 MG TABLET    Take 1 tablet (1 mg total) by mouth daily.   FUROSEMIDE  (LASIX) 80 MG TABLET    Take 1 tablet (80 mg total) by mouth daily.   GABAPENTIN (NEURONTIN) 100 MG CAPSULE    Take 1 capsule (100 mg total) by mouth 2 (two) times daily.   ISOSORBIDE-HYDRALAZINE (BIDIL) 20-37.5 MG PER TABLET    Take 1 tablet by mouth 3 (three) times daily.   MULTIPLE VITAMIN (MULTIVITAMIN WITH MINERALS) TABS TABLET    Take 1 tablet by mouth daily.   OMEPRAZOLE (PRILOSEC) 20 MG CAPSULE    Take 20 mg by mouth daily.   PANTOPRAZOLE (PROTONIX) 40 MG TABLET    Take 1 tablet (40 mg total) by mouth daily at 12 noon.   TAMSULOSIN (FLOMAX) 0.4 MG CAPS CAPSULE    Take 1 capsule (0.4 mg total) by mouth daily after supper.  Modified Medications   No medications on file  Discontinued Medications   No medications on file     Review of Systems  Constitutional: Negative for fever, chills, activity change, appetite change and fatigue.  HENT: Negative for sore throat and trouble swallowing.   Eyes: Negative for visual disturbance.  Respiratory: Negative for cough, chest tightness and shortness of breath.   Cardiovascular: Negative for chest pain, palpitations and leg swelling.  Gastrointestinal: Negative for  nausea, vomiting, abdominal pain, diarrhea and constipation.  Genitourinary: Negative for difficulty urinating.  Musculoskeletal: Positive for joint swelling and arthralgias.  Neurological: Negative for dizziness, numbness and headaches.  Psychiatric/Behavioral: Negative for sleep disturbance.      Filed Vitals:   05/06/14 0845  BP: 164/68  Pulse: 72  Temp: 97.6 F (36.4 C)  TempSrc: Oral  Resp: 20  Height: $Remove'5\' 11"'[id number]$  (1.803 m)  Weight: 200 lb 9.6 oz (90.992 kg)  SpO2: 99%   Body mass index is 27.99 kg/(m^2).  Physical Exam CONSTITUTIONAL: Looks frail in NAD. Awake, alert and oriented x 3 HEENT: PERRLA. No scleral icterus. Oropharynx clear and without exudate. MMM NECK: Supple. Nontender. No palpable cervical or supraclavicular lymph nodes. No carotid bruit b/l. CVS:  Regular rate without murmur, gallop or rub. LUNGS: CTA b/l no wheezing, rales or rhonchi. ABDOMEN: Bowel sounds present x 4. Soft, nontender, nondistended. No palpable mass or bruit EXTREMITIES: +1 pitting LE edema b/l. Distal pulses palpable. No calf tenderness PSYCH: Affect, behavior and mood normal MUSC: L>R knee swelling; right knee reduced ROM; ambulates with w/c   Labs reviewed: Admission on 02/08/2014, Discharged on 02/16/2014  Component Date Value Ref Range Status  . WBC 02/08/2014 8.5  4.0 - 10.5 K/uL Final  . RBC 02/08/2014 4.34  4.22 - 5.81 MIL/uL Final  . Hemoglobin 02/08/2014 14.1  13.0 - 17.0 g/dL Final  . HCT 02/08/2014 42.3  39.0 - 52.0 % Final  . MCV 02/08/2014 97.5  78.0 - 100.0 fL Final  . MCH 02/08/2014 32.5  26.0 - 34.0 pg Final  . MCHC 02/08/2014 33.3  30.0 - 36.0 g/dL Final  . RDW 02/08/2014 16.4* 11.5 - 15.5 % Final  . Platelets 02/08/2014 208  150 - 400 K/uL Final  . Neutrophils Relative % 02/08/2014 78* 43 - 77 % Final  . Neutro Abs 02/08/2014 6.7  1.7 - 7.7 K/uL Final  . Lymphocytes Relative 02/08/2014 7* 12 - 46 % Final  . Lymphs Abs 02/08/2014 0.6* 0.7 - 4.0 K/uL Final  . Monocytes Relative 02/08/2014 15* 3 - 12 % Final  . Monocytes Absolute 02/08/2014 1.2* 0.1 - 1.0 K/uL Final  . Eosinophils Relative 02/08/2014 0  0 - 5 % Final  . Eosinophils Absolute 02/08/2014 0.0  0.0 - 0.7 K/uL Final  . Basophils Relative 02/08/2014 0  0 - 1 % Final  . Basophils Absolute 02/08/2014 0.0  0.0 - 0.1 K/uL Final  . Sodium 02/08/2014 140  137 - 147 mEq/L Final  . Potassium 02/08/2014 4.8  3.7 - 5.3 mEq/L Final  . Chloride 02/08/2014 98  96 - 112 mEq/L Final  . CO2 02/08/2014 18* 19 - 32 mEq/L Final  . Glucose, Bld 02/08/2014 87  70 - 99 mg/dL Final  . BUN 02/08/2014 42* 6 - 23 mg/dL Final  . Creatinine, Ser 02/08/2014 2.60* 0.50 - 1.35 mg/dL Final  . Calcium 02/08/2014 8.1* 8.4 - 10.5 mg/dL Final  . Total Protein 02/08/2014 6.5  6.0 - 8.3 g/dL Final  . Albumin  02/08/2014 3.5  3.5 - 5.2 g/dL Final  . AST 02/08/2014 206* 0 - 37 U/L Final  . ALT 02/08/2014 136* 0 - 53 U/L Final  . Alkaline Phosphatase 02/08/2014 95  39 - 117 U/L Final  . Total Bilirubin 02/08/2014 1.4* 0.3 - 1.2 mg/dL Final  . GFR calc non Af Amer 02/08/2014 21* >90 mL/min Final  . GFR calc Af Amer 02/08/2014 24* >90 mL/min Final   Comment: (NOTE) The  eGFR has been calculated using the CKD EPI equation. This calculation has not been validated in all clinical situations. eGFR's persistently <90 mL/min signify possible Chronic Kidney Disease.   . Anion gap 02/08/2014 24* 5 - 15 Final   REPEATED TO VERIFY  . Troponin I 02/08/2014 <0.30  <0.30 ng/mL Final   Comment:        Due to the release kinetics of cTnI, a negative result within the first hours of the onset of symptoms does not rule out myocardial infarction with certainty. If myocardial infarction is still suspected, repeat the test at appropriate intervals.   . Pro B Natriuretic peptide (BNP) 02/08/2014 62377.0* 0 - 450 pg/mL Final  . Alcohol, Ethyl (B) 02/08/2014 27* 0 - 11 mg/dL Final   Comment:        LOWEST DETECTABLE LIMIT FOR SERUM ALCOHOL IS 11 mg/dL FOR MEDICAL PURPOSES ONLY   . Sodium 02/09/2014 144  137 - 147 mEq/L Final  . Potassium 02/09/2014 4.2  3.7 - 5.3 mEq/L Final  . Chloride 02/09/2014 102  96 - 112 mEq/L Final  . CO2 02/09/2014 19  19 - 32 mEq/L Final  . Glucose, Bld 02/09/2014 91  70 - 99 mg/dL Final  . BUN 02/09/2014 44* 6 - 23 mg/dL Final  . Creatinine, Ser 02/09/2014 2.52* 0.50 - 1.35 mg/dL Final  . Calcium 02/09/2014 7.8* 8.4 - 10.5 mg/dL Final  . GFR calc non Af Amer 02/09/2014 22* >90 mL/min Final  . GFR calc Af Amer 02/09/2014 25* >90 mL/min Final   Comment: (NOTE) The eGFR has been calculated using the CKD EPI equation. This calculation has not been validated in all clinical situations. eGFR's persistently <90 mL/min signify possible Chronic Kidney Disease.   . Anion gap 02/09/2014  23* 5 - 15 Final  . Vitamin B-12 02/09/2014 759  211 - 911 pg/mL Final   Performed at [facility]  . RBC Folate 02/09/2014 946* >280 ng/mL Final   Comment: Reference range not established for pediatric patients. Performed at [facility]   . Hgb A1c MFr Bld 02/09/2014 6.1* <5.7 % Final   Comment: (NOTE)                                                                       According to the ADA Clinical Practice Recommendations for 2011, when HbA1c is used as a screening test:  >=6.5%   Diagnostic of Diabetes Mellitus           (if abnormal result is confirmed) 5.7-6.4%   Increased risk of developing Diabetes Mellitus References:Diagnosis and Classification of Diabetes Mellitus,Diabetes PTWS,5681,27(NTZGY 1):S62-S69 and Standards of Medical Care in         Diabetes - 2011,Diabetes FVCB,4496,75 (Suppl 1):S11-S61.   . Mean Plasma Glucose 02/09/2014 128* <117 mg/dL Final   Performed at [facility]  . Sodium 02/10/2014 138  137 - 147 mEq/L Final  . Potassium 02/10/2014 4.1  3.7 - 5.3 mEq/L Final  . Chloride 02/10/2014 98  96 - 112 mEq/L Final  . CO2 02/10/2014 22  19 - 32 mEq/L Final  . Glucose, Bld 02/10/2014 140* 70 - 99 mg/dL Final  . BUN 02/10/2014 54* 6 - 23 mg/dL Final  .  Creatinine, Ser 02/10/2014 2.52* 0.50 - 1.35 mg/dL Final  . Calcium 02/10/2014 7.5* 8.4 - 10.5 mg/dL Final  . Total Protein 02/10/2014 5.7* 6.0 - 8.3 g/dL Final  . Albumin 02/10/2014 3.0* 3.5 - 5.2 g/dL Final  . AST 02/10/2014 64* 0 - 37 U/L Final  . ALT 02/10/2014 91* 0 - 53 U/L Final  . Alkaline Phosphatase 02/10/2014 94  39 - 117 U/L Final  . Total Bilirubin 02/10/2014 1.0  0.3 - 1.2 mg/dL Final  . GFR calc non Af Amer 02/10/2014 22* >90 mL/min Final  . GFR calc Af Amer 02/10/2014 25* >90 mL/min Final   Comment: (NOTE) The eGFR has been calculated using the CKD EPI equation. This calculation has not been validated in all clinical situations. eGFR's persistently <90 mL/min signify  possible Chronic Kidney Disease.   . Anion gap 02/10/2014 18* 5 - 15 Final  . WBC 02/10/2014 6.4  4.0 - 10.5 K/uL Final  . RBC 02/10/2014 3.98* 4.22 - 5.81 MIL/uL Final  . Hemoglobin 02/10/2014 12.8* 13.0 - 17.0 g/dL Final  . HCT 02/10/2014 38.8* 39.0 - 52.0 % Final  . MCV 02/10/2014 97.5  78.0 - 100.0 fL Final  . MCH 02/10/2014 32.2  26.0 - 34.0 pg Final  . MCHC 02/10/2014 33.0  30.0 - 36.0 g/dL Final  . RDW 02/10/2014 16.5* 11.5 - 15.5 % Final  . Platelets 02/10/2014 189  150 - 400 K/uL Final  . Sodium 02/11/2014 140  137 - 147 mEq/L Final  . Potassium 02/11/2014 4.1  3.7 - 5.3 mEq/L Final  . Chloride 02/11/2014 98  96 - 112 mEq/L Final  . CO2 02/11/2014 24  19 - 32 mEq/L Final  . Glucose, Bld 02/11/2014 137* 70 - 99 mg/dL Final  . BUN 02/11/2014 57* 6 - 23 mg/dL Final  . Creatinine, Ser 02/11/2014 2.35* 0.50 - 1.35 mg/dL Final  . Calcium 02/11/2014 7.6* 8.4 - 10.5 mg/dL Final  . GFR calc non Af Amer 02/11/2014 23* >90 mL/min Final  . GFR calc Af Amer 02/11/2014 27* >90 mL/min Final   Comment: (NOTE) The eGFR has been calculated using the CKD EPI equation. This calculation has not been validated in all clinical situations. eGFR's persistently <90 mL/min signify possible Chronic Kidney Disease.   . Anion gap 02/11/2014 18* 5 - 15 Final  . Sodium 02/13/2014 138  137 - 147 mEq/L Final  . Potassium 02/13/2014 4.3  3.7 - 5.3 mEq/L Final  . Chloride 02/13/2014 98  96 - 112 mEq/L Final  . CO2 02/13/2014 24  19 - 32 mEq/L Final  . Glucose, Bld 02/13/2014 110* 70 - 99 mg/dL Final  . BUN 02/13/2014 54* 6 - 23 mg/dL Final  . Creatinine, Ser 02/13/2014 1.79* 0.50 - 1.35 mg/dL Final  . Calcium 02/13/2014 7.8* 8.4 - 10.5 mg/dL Final  . Total Protein 02/13/2014 6.0  6.0 - 8.3 g/dL Final  . Albumin 02/13/2014 3.1* 3.5 - 5.2 g/dL Final  . AST 02/13/2014 34  0 - 37 U/L Final  . ALT 02/13/2014 58* 0 - 53 U/L Final  . Alkaline Phosphatase 02/13/2014 91  39 - 117 U/L Final  . Total Bilirubin  02/13/2014 0.8  0.3 - 1.2 mg/dL Final  . GFR calc non Af Amer 02/13/2014 33* >90 mL/min Final  . GFR calc Af Amer 02/13/2014 38* >90 mL/min Final   Comment: (NOTE) The eGFR has been calculated using the CKD EPI equation. This calculation has not been validated in all clinical  situations. eGFR's persistently <90 mL/min signify possible Chronic Kidney Disease.   . Anion gap 02/13/2014 16* 5 - 15 Final  . Pro B Natriuretic peptide (BNP) 02/13/2014 23222.0* 0 - 450 pg/mL Final  . Sodium 02/14/2014 133* 137 - 147 mEq/L Final  . Potassium 02/14/2014 4.6  3.7 - 5.3 mEq/L Final  . Chloride 02/14/2014 94* 96 - 112 mEq/L Final  . CO2 02/14/2014 24  19 - 32 mEq/L Final  . Glucose, Bld 02/14/2014 120* 70 - 99 mg/dL Final  . BUN 02/14/2014 45* 6 - 23 mg/dL Final  . Creatinine, Ser 02/14/2014 1.63* 0.50 - 1.35 mg/dL Final  . Calcium 02/14/2014 7.8* 8.4 - 10.5 mg/dL Final  . GFR calc non Af Amer 02/14/2014 37* >90 mL/min Final  . GFR calc Af Amer 02/14/2014 42* >90 mL/min Final   Comment: (NOTE) The eGFR has been calculated using the CKD EPI equation. This calculation has not been validated in all clinical situations. eGFR's persistently <90 mL/min signify possible Chronic Kidney Disease.   . Anion gap 02/14/2014 15  5 - 15 Final  . Sodium 02/15/2014 138  137 - 147 mEq/L Final  . Potassium 02/15/2014 3.8  3.7 - 5.3 mEq/L Final   DELTA CHECK NOTED  . Chloride 02/15/2014 97  96 - 112 mEq/L Final  . CO2 02/15/2014 28  19 - 32 mEq/L Final  . Glucose, Bld 02/15/2014 90  70 - 99 mg/dL Final  . BUN 02/15/2014 38* 6 - 23 mg/dL Final  . Creatinine, Ser 02/15/2014 1.59* 0.50 - 1.35 mg/dL Final  . Calcium 02/15/2014 7.7* 8.4 - 10.5 mg/dL Final  . GFR calc non Af Amer 02/15/2014 38* >90 mL/min Final  . GFR calc Af Amer 02/15/2014 44* >90 mL/min Final   Comment: (NOTE) The eGFR has been calculated using the CKD EPI equation. This calculation has not been validated in all clinical situations. eGFR's  persistently <90 mL/min signify possible Chronic Kidney Disease.   . Anion gap 02/15/2014 13  5 - 15 Final  . WBC 02/15/2014 5.3  4.0 - 10.5 K/uL Final  . RBC 02/15/2014 3.65* 4.22 - 5.81 MIL/uL Final  . Hemoglobin 02/15/2014 11.8* 13.0 - 17.0 g/dL Final  . HCT 02/15/2014 35.3* 39.0 - 52.0 % Final  . MCV 02/15/2014 96.7  78.0 - 100.0 fL Final  . MCH 02/15/2014 32.3  26.0 - 34.0 pg Final  . MCHC 02/15/2014 33.4  30.0 - 36.0 g/dL Final  . RDW 02/15/2014 17.1* 11.5 - 15.5 % Final  . Platelets 02/15/2014 198  150 - 400 K/uL Final  . Sodium 02/16/2014 136* 137 - 147 mEq/L Final  . Potassium 02/16/2014 3.8  3.7 - 5.3 mEq/L Final  . Chloride 02/16/2014 95* 96 - 112 mEq/L Final  . CO2 02/16/2014 29  19 - 32 mEq/L Final  . Glucose, Bld 02/16/2014 101* 70 - 99 mg/dL Final  . BUN 02/16/2014 36* 6 - 23 mg/dL Final  . Creatinine, Ser 02/16/2014 1.63* 0.50 - 1.35 mg/dL Final  . Calcium 02/16/2014 7.7* 8.4 - 10.5 mg/dL Final  . GFR calc non Af Amer 02/16/2014 37* >90 mL/min Final  . GFR calc Af Amer 02/16/2014 42* >90 mL/min Final   Comment: (NOTE) The eGFR has been calculated using the CKD EPI equation. This calculation has not been validated in all clinical situations. eGFR's persistently <90 mL/min signify possible Chronic Kidney Disease.   Georgiann Hahn gap 02/16/2014 12  5 - 15 Final    Hospital records  and chart reviewed   Assessment/Plan   ICD-9-CM ICD-10-CM   1. Essential hypertension uncontrolled 401.9 I10   2. Arthritis involving multiple sites 716.99 M12.9 Ambulatory referral to Orthopedic Surgery  3. Chronic systolic heart failure - stable 428.22 I50.22   4. PVD (peripheral vascular disease) 443.9 I73.9   5. Alcohol abuse  305.00 F10.10   6. Neuropathy 355.9 G62.9   7. CKD (chronic kidney disease), stage 3 (moderate) 585.3 N18.3    --No alcohol intake  --Will call with Orthopedic appt to have joint injections  --Reduce sodium intake and watch complex carbs  --Take tylenol ES  for pain. Avoid NSAIDS (aleve, advil, ibuprofen, naprosyn) due to reduced kidney function  --take all medications as prescribed. He has not taken them on a daily basis and admits to noncompliance  --will need labs at next OV (CMP and lipid panel)  --Follow up in 1 month to recheck blood pressure. Get old records   Hazley Dezeeuw S. Perlie Gold  [facility] [address] [place], [place] [address] [phone] Office (Wednesdays and Fridays 8 AM - 5 PM) [phone] Cell (Monday-Friday 8 AM - 5 PM)

## 2014-05-07 ENCOUNTER — Encounter: Payer: Self-pay | Admitting: Internal Medicine

## 2014-05-10 ENCOUNTER — Encounter (HOSPITAL_COMMUNITY): Payer: Self-pay | Admitting: Emergency Medicine

## 2014-05-10 ENCOUNTER — Emergency Department (HOSPITAL_COMMUNITY): Payer: Medicare Other

## 2014-05-10 ENCOUNTER — Emergency Department (HOSPITAL_COMMUNITY)
Admission: EM | Admit: 2014-05-10 | Discharge: 2014-05-11 | Disposition: A | Discharge status: Home / Self Care | Payer: Medicare Other | Attending: Emergency Medicine | Admitting: Emergency Medicine

## 2014-05-10 DIAGNOSIS — M109 Gout, unspecified: Secondary | ICD-10-CM | POA: Diagnosis not present

## 2014-05-10 DIAGNOSIS — Y908 Blood alcohol level of 240 mg/100 ml or more: Secondary | ICD-10-CM | POA: Insufficient documentation

## 2014-05-10 DIAGNOSIS — I10 Essential (primary) hypertension: Secondary | ICD-10-CM | POA: Insufficient documentation

## 2014-05-10 DIAGNOSIS — E860 Dehydration: Secondary | ICD-10-CM | POA: Insufficient documentation

## 2014-05-10 DIAGNOSIS — Z7982 Long term (current) use of aspirin: Secondary | ICD-10-CM | POA: Diagnosis not present

## 2014-05-10 DIAGNOSIS — I509 Heart failure, unspecified: Secondary | ICD-10-CM | POA: Diagnosis not present

## 2014-05-10 DIAGNOSIS — R4182 Altered mental status, unspecified: Secondary | ICD-10-CM | POA: Diagnosis present

## 2014-05-10 DIAGNOSIS — Z79899 Other long term (current) drug therapy: Secondary | ICD-10-CM | POA: Diagnosis not present

## 2014-05-10 DIAGNOSIS — R404 Transient alteration of awareness: Secondary | ICD-10-CM | POA: Diagnosis not present

## 2014-05-10 DIAGNOSIS — F10120 Alcohol abuse with intoxication, uncomplicated: Secondary | ICD-10-CM | POA: Insufficient documentation

## 2014-05-10 DIAGNOSIS — F10929 Alcohol use, unspecified with intoxication, unspecified: Secondary | ICD-10-CM | POA: Diagnosis present

## 2014-05-10 NOTE — ED Provider Notes (Signed)
CSN: [id number]     Arrival date & time 05/10/14  2330 History  This chart was scribed for Sharyon Cable, MD by Chester Holstein, ED Scribe. This patient was seen in room A06C/A06C and the patient's care was started at 11:36 PM.     Chief Complaint  Patient presents with  . Altered Mental Status   LEVEL 5 CAVEAT- ALTERED MENTAL STATUS  Patient is a 81 y.o. male presenting with altered mental status. The history is provided by the EMS personnel. The history is limited by the condition of the patient. No language interpreter was used.  Altered Mental Status Presenting symptoms: behavior changes and combativeness   Timing:  Constant Chronicity:  New Context: alcohol use    HPI Comments: Jesse Allen is a 81 y.o. male brought in by ambulance, with PMHx of gout, HTN, and CHF who presents to the Emergency Department complaining of AMS. Pt's neighbors wait for pt to turn off lights to go to bed every night, when he didn't do so they went to check on him and found him on the floor. EMS notes 1/2 gallon of vodka on counter. EMS states his blood sugar was low at 36. They were unable to get him to eat something; pt given oral glucose and OJ. Pt notes pt's nail beds were cyanotic upon arrival improving currently. Pt unable to ambulate. Pt repeating "shoot me" in the exam room. No other details are known at arrival  Past Medical History  Diagnosis Date  . Gout   . Hypertension   . CHF (congestive heart failure)    History reviewed. No pertinent past surgical history. No family history on file. History  Substance Use Topics  . Smoking status: Never Smoker   . Smokeless tobacco: Not on file  . Alcohol Use: Yes     Comment: today- "a double shot" (occasional use)    Review of Systems  Unable to perform ROS: Mental status change  All other systems reviewed and are negative.     Allergies  Review of patient's allergies indicates no known allergies.  Home Medications   Prior to  Admission medications   Medication Sig Start Date End Date Taking? Authorizing Provider  aspirin EC 81 MG EC tablet Take 1 tablet (81 mg total) by mouth daily. 12/01/12   Barton Dubois, MD  colchicine 0.6 MG tablet Take 1 tablet (0.6 mg total) by mouth 2 (two) times daily. 05/02/14   Lauree Chandler, NP  folic acid (FOLVITE) 1 MG tablet Take 1 tablet (1 mg total) by mouth daily. Patient not taking: Reported on 02/26/2014 02/16/14   Hosie Poisson, MD  furosemide (LASIX) 80 MG tablet Take 1 tablet (80 mg total) by mouth daily. 05/02/14   Lauree Chandler, NP  gabapentin (NEURONTIN) 100 MG capsule Take 1 capsule (100 mg total) by mouth 2 (two) times daily. Patient not taking: Reported on 05/06/2014 12/01/12   Barton Dubois, MD  isosorbide-hydrALAZINE (BIDIL) 20-37.5 MG per tablet Take 1 tablet by mouth 3 (three) times daily. Patient not taking: Reported on 05/06/2014 02/13/14   Hosie Poisson, MD  Multiple Vitamin (MULTIVITAMIN WITH MINERALS) TABS tablet Take 1 tablet by mouth daily. Patient not taking: Reported on 05/06/2014 02/13/14   Hosie Poisson, MD  omeprazole (PRILOSEC) 20 MG capsule Take 20 mg by mouth daily.    Historical Provider, MD  pantoprazole (PROTONIX) 40 MG tablet Take 1 tablet (40 mg total) by mouth daily at 12 noon. 12/01/12   Barton Dubois,  MD  tamsulosin (FLOMAX) 0.4 MG CAPS capsule Take 1 capsule (0.4 mg total) by mouth daily after supper. 12/01/12   Barton Dubois, MD   BP 145/64 mmHg  Pulse 79  Temp(Src) 99 F (37.2 C) (Oral)  Resp 20  SpO2 100% Physical Exam  Nursing note and vitals reviewed.   CONSTITUTIONAL: disheveled, pt smells of ketones. HEAD: Normocephalic/atraumatic EYES: EOMI/PERRL ENMT: Mucous membranes dry NECK: supple no meningeal signs SPINE/BACK:entire spine nontender CV: S1/S2 noted LUNGS: Lungs are clear to auscultation bilaterally, no apparent distress ABDOMEN: soft, nontender, no rebound or guarding, bowel sounds noted throughout abdomen GU:no cva  tenderness NEURO: Pt is awake/alert; pt is mildly agitated requesting medical staff to shoot him, moves all extremitiesx4.    EXTREMITIES:  full ROM; no deformity noted. Feet are warm to touch without discoloration to feet/toes SKIN: warm, color normal PSYCH: agitated  ED Course  Procedures  DIAGNOSTIC STUDIES: Oxygen Saturation is 100% normal by my interpretation.    COORDINATION OF CARE: 11:43 PM Discussed treatment plan with patient at beside, the patient agrees with the plan and has no further questions at this time. Pt with h/o ETOH abuse per records However given age will enact AMS workup tPA in stroke considered but not given due to: Onset over 3-4.5hours (unknown time of onset)  2:05 AM Pt found to be dehydrated with ETOH intoxication CT head negative He is sleeping but arousable D/w hospitalist dr Jana Hakim Will evaluate for consult but she suggests to monitor patient, allow him to sober and consult case management in the morning and can be discharged home without admission Will rehydrate here Will check glucose in the ED (had hypoglycemia at home per EMS) (hyperkalemia was due to hemolysis, repeat normal)  6:05 AM Pt now awake/alert He would like to go home He is not interested in any sort of substance abuse program He reports he only took one shot of liquor He reports chronic knee pain but no other complaint He is not interested in any rehab facility I spoke to [employer] at [phone] She will come pick him up 6:24 AM Will feed patient prior to d/c home FAMILY CALLED AND WON'T BE HERE UNTIL 9AM NURSE WILL CONTACT CASE MANAGEMENT BP 154/71 mmHg  Pulse 85  Temp(Src) 96.9 F (36.1 C) (Rectal)  Resp 22  SpO2 100%   Labs Review Labs Reviewed  COMPREHENSIVE METABOLIC PANEL - Abnormal; Notable for the following:    Potassium 7.4 (*)    CO2 15 (*)    Glucose, Bld 128 (*)    BUN 24 (*)    AST 76 (*)    Total Bilirubin 1.9 (*)    GFR calc non  Af Amer 53 (*)    GFR calc Af Amer 61 (*)    All other components within normal limits  CBC WITH DIFFERENTIAL/PLATELET - Abnormal; Notable for the following:    Monocytes Absolute 1.1 (*)    All other components within normal limits  URINALYSIS, ROUTINE W REFLEX MICROSCOPIC - Abnormal; Notable for the following:    Ketones, ur 15 (*)    All other components within normal limits  ACETAMINOPHEN LEVEL - Abnormal; Notable for the following:    Acetaminophen (Tylenol), Serum <10.0 (*)    All other components within normal limits  CK - Abnormal; Notable for the following:    Total CK 441 (*)    All other components within normal limits  ETHANOL - Abnormal; Notable for the following:    Alcohol,  Ethyl (B) 249 (*)    All other components within normal limits  BASIC METABOLIC PANEL - Abnormal; Notable for the following:    CO2 16 (*)    Glucose, Bld 174 (*)    BUN 25 (*)    Calcium 8.2 (*)    GFR calc non Af Amer 58 (*)    GFR calc Af Amer 68 (*)    All other components within normal limits  CBG MONITORING, ED - Abnormal; Notable for the following:    Glucose-Capillary 126 (*)    All other components within normal limits  I-STAT CHEM 8, ED - Abnormal; Notable for the following:    BUN 29 (*)    Glucose, Bld 185 (*)    Calcium, Ion 0.99 (*)    All other components within normal limits  CBG MONITORING, ED - Abnormal; Notable for the following:    Glucose-Capillary 132 (*)    All other components within normal limits  CBG MONITORING, ED - Abnormal; Notable for the following:    Glucose-Capillary 61 (*)    All other components within normal limits  URINE CULTURE  URINE RAPID DRUG SCREEN (HOSP PERFORMED)  SALICYLATE LEVEL  I-STAT TROPOININ, ED    Imaging Review Ct Head Wo Contrast  05/11/2014   CLINICAL DATA:  Altered mental status. Found on the floor. Low blood sugar.  EXAM: CT HEAD WITHOUT CONTRAST  TECHNIQUE: Contiguous axial images were obtained from the base of the skull  through the vertex without intravenous contrast.  COMPARISON:  None.  FINDINGS: Diffuse cerebral atrophy. Mild ventricular dilatation consistent with central atrophy. Low-attenuation changes in the deep white matter consistent small vessel ischemia. No mass effect or midline shift. No abnormal extra-axial fluid collections. Gray-white matter junctions are distinct. Basal cisterns are not effaced. No evidence of acute intracranial hemorrhage. No depressed skull fractures. Visualized paranasal sinuses and mastoid air cells are not opacified. Vascular calcifications.  IMPRESSION: No acute intracranial abnormalities. Chronic atrophy and small vessel ischemic changes.   Electronically Signed   By: Lucienne Capers M.D.   On: 05/11/2014 00:43   Dg Chest Portable 1 View  05/11/2014   CLINICAL DATA:  Altered mental status. Patient was found on the floor. Low blood sugar. Nail bed cyanotic upon arrival but now improving.  EXAM: PORTABLE CHEST - 1 VIEW  COMPARISON:  02/08/2014  FINDINGS: Shallow inspiration. Normal heart size and pulmonary vascularity. No focal airspace disease or consolidation in the lungs. No blunting of costophrenic angles. No pneumothorax. Mediastinal contours appear intact. Tortuous aorta. Degenerative changes in the spine and shoulders.  IMPRESSION: No active disease.   Electronically Signed   By: Lucienne Capers M.D.   On: 05/11/2014 00:17     EKG Interpretation   Date/Time:  Monday May 11 2014 00:14:03 EST Ventricular Rate:  79 PR Interval:  167 QRS Duration: 83 QT Interval:  485 QTC Calculation: 556 R Axis:   17 Text Interpretation:  Sinus rhythm Multiple premature complexes, vent   Borderline low voltage, extremity leads Prolonged QT interval Abnormal ekg  No significant change since last tracing Confirmed by Christy Gentles  MD, Elenore Rota  [phone]) on 05/11/2014 12:20:36 AM      MDM   Final diagnoses:  Alcohol intoxication, with unspecified complication  Dehydration     Nursing notes including past medical history and social history reviewed and considered in documentation xrays/imaging reviewed by myself and considered during evaluation Labs/vital reviewed myself and considered during evaluation Previous records reviewed and considered  I personally performed the services described in this documentation, which was scribed in my presence. The recorded information has been reviewed and is accurate.       Sharyon Cable, MD 05/11/14 [id number]

## 2014-05-10 NOTE — ED Notes (Signed)
Pt arrives from home with c/o fall, CBG intially 36. Two tubes oral glucose and OJ. Found down by neighbors. CBG 110 now. More alert than earlier. ETOH intake. Pt requesting to be shot with 12 gauge shotgun. VSS

## 2014-05-11 ENCOUNTER — Telehealth: Payer: Self-pay | Admitting: Internal Medicine

## 2014-05-11 DIAGNOSIS — R404 Transient alteration of awareness: Secondary | ICD-10-CM | POA: Diagnosis not present

## 2014-05-11 DIAGNOSIS — F10120 Alcohol abuse with intoxication, uncomplicated: Secondary | ICD-10-CM | POA: Diagnosis not present

## 2014-05-11 DIAGNOSIS — E860 Dehydration: Secondary | ICD-10-CM | POA: Insufficient documentation

## 2014-05-11 DIAGNOSIS — F10929 Alcohol use, unspecified with intoxication, unspecified: Secondary | ICD-10-CM | POA: Diagnosis present

## 2014-05-11 LAB — BASIC METABOLIC PANEL
Anion gap: 15 (ref 5–15)
BUN: 25 mg/dL — ABNORMAL HIGH (ref 6–23)
CALCIUM: 8.2 mg/dL — AB (ref 8.4–10.5)
CO2: 16 mmol/L — ABNORMAL LOW (ref 19–32)
Chloride: 106 mmol/L (ref 96–112)
Creatinine, Ser: 1.1 mg/dL (ref 0.50–1.35)
GFR calc Af Amer: 68 mL/min — ABNORMAL LOW (ref >90)
GFR, EST NON AFRICAN AMERICAN: 58 mL/min — AB (ref >90)
Glucose, Bld: 174 mg/dL — ABNORMAL HIGH (ref 70–99)
Potassium: 4.3 mmol/L (ref 3.5–5.1)
SODIUM: 137 mmol/L (ref 135–145)

## 2014-05-11 LAB — SALICYLATE LEVEL: Salicylate Lvl: <4 mg/dL (ref 2.8–20.0)

## 2014-05-11 LAB — CBC WITH DIFFERENTIAL/PLATELET
BASOS ABS: 0 10*3/uL (ref 0.0–0.1)
Basophils Relative: 0 % (ref 0–1)
Eosinophils Absolute: 0.1 10*3/uL (ref 0.0–0.7)
Eosinophils Relative: 1 % (ref 0–5)
HCT: 41.9 % (ref 39.0–52.0)
Hemoglobin: 13.8 g/dL (ref 13.0–17.0)
Lymphocytes Relative: 15 % (ref 12–46)
Lymphs Abs: 1.4 10*3/uL (ref 0.7–4.0)
MCH: 30.9 pg (ref 26.0–34.0)
MCHC: 32.9 g/dL (ref 30.0–36.0)
MCV: 93.7 fL (ref 78.0–100.0)
MONO ABS: 1.1 10*3/uL — AB (ref 0.1–1.0)
Monocytes Relative: 12 % (ref 3–12)
NEUTROS PCT: 72 % (ref 43–77)
Neutro Abs: 6.4 10*3/uL (ref 1.7–7.7)
Platelets: 260 10*3/uL (ref 150–400)
RBC: 4.47 MIL/uL (ref 4.22–5.81)
RDW: 15.1 % (ref 11.5–15.5)
WBC: 8.9 10*3/uL (ref 4.0–10.5)

## 2014-05-11 LAB — URINALYSIS, ROUTINE W REFLEX MICROSCOPIC
Bilirubin Urine: NEGATIVE
Glucose, UA: NEGATIVE mg/dL
Hgb urine dipstick: NEGATIVE
Ketones, ur: 15 mg/dL — AB
Leukocytes, UA: NEGATIVE
Nitrite: NEGATIVE
PH: 5 (ref 5.0–8.0)
PROTEIN: NEGATIVE mg/dL
SPECIFIC GRAVITY, URINE: 1.015 (ref 1.005–1.030)
Urobilinogen, UA: 1 mg/dL (ref 0.0–1.0)

## 2014-05-11 LAB — COMPREHENSIVE METABOLIC PANEL
ALT: 16 U/L (ref 0–53)
ANION GAP: 15 (ref 5–15)
AST: 76 U/L — ABNORMAL HIGH (ref 0–37)
Albumin: 3.9 g/dL (ref 3.5–5.2)
Alkaline Phosphatase: 84 U/L (ref 39–117)
BUN: 24 mg/dL — AB (ref 6–23)
CO2: 15 mmol/L — AB (ref 19–32)
Calcium: 8.5 mg/dL (ref 8.4–10.5)
Chloride: 105 mmol/L (ref 96–112)
Creatinine, Ser: 1.2 mg/dL (ref 0.50–1.35)
GFR calc Af Amer: 61 mL/min — ABNORMAL LOW (ref >90)
GFR calc non Af Amer: 53 mL/min — ABNORMAL LOW (ref >90)
GLUCOSE: 128 mg/dL — AB (ref 70–99)
POTASSIUM: 7.4 mmol/L — AB (ref 3.5–5.1)
SODIUM: 135 mmol/L (ref 135–145)
Total Bilirubin: 1.9 mg/dL — ABNORMAL HIGH (ref 0.3–1.2)
Total Protein: 6.9 g/dL (ref 6.0–8.3)

## 2014-05-11 LAB — CBG MONITORING, ED
GLUCOSE-CAPILLARY: 85 mg/dL (ref 70–99)
Glucose-Capillary: 126 mg/dL — ABNORMAL HIGH (ref 70–99)
Glucose-Capillary: 132 mg/dL — ABNORMAL HIGH (ref 70–99)
Glucose-Capillary: 132 mg/dL — ABNORMAL HIGH (ref 70–99)
Glucose-Capillary: 61 mg/dL — ABNORMAL LOW (ref 70–99)

## 2014-05-11 LAB — I-STAT TROPONIN, ED: TROPONIN I, POC: 0.03 ng/mL (ref 0.00–0.08)

## 2014-05-11 LAB — I-STAT CHEM 8, ED
BUN: 29 mg/dL — AB (ref 6–23)
Calcium, Ion: 0.99 mmol/L — ABNORMAL LOW (ref 1.13–1.30)
Chloride: 106 mmol/L (ref 96–112)
Creatinine, Ser: 1.3 mg/dL (ref 0.50–1.35)
Glucose, Bld: 185 mg/dL — ABNORMAL HIGH (ref 70–99)
HEMATOCRIT: 40 % (ref 39.0–52.0)
HEMOGLOBIN: 13.6 g/dL (ref 13.0–17.0)
Potassium: 4.3 mmol/L (ref 3.5–5.1)
SODIUM: 138 mmol/L (ref 135–145)
TCO2: 14 mmol/L (ref 0–100)

## 2014-05-11 LAB — CK: CK TOTAL: 441 U/L — AB (ref 7–232)

## 2014-05-11 LAB — RAPID URINE DRUG SCREEN, HOSP PERFORMED
AMPHETAMINES: NOT DETECTED
BARBITURATES: NOT DETECTED
Benzodiazepines: NOT DETECTED
COCAINE: NOT DETECTED
Opiates: NOT DETECTED
TETRAHYDROCANNABINOL: NOT DETECTED

## 2014-05-11 LAB — ETHANOL: ALCOHOL ETHYL (B): 249 mg/dL — AB (ref 0–9)

## 2014-05-11 LAB — ACETAMINOPHEN LEVEL: Acetaminophen (Tylenol), Serum: <10 ug/mL — ABNORMAL LOW (ref 10–30)

## 2014-05-11 MED ORDER — ACETAMINOPHEN 325 MG PO TABS
650.0000 mg | ORAL_TABLET | Freq: Once | ORAL | Status: AC
  Administered 2014-05-11: 650 mg via ORAL
  Filled 2014-05-11: qty 2

## 2014-05-11 MED ORDER — SODIUM CHLORIDE 0.9 % IV BOLUS (SEPSIS)
500.0000 mL | Freq: Once | INTRAVENOUS | Status: AC
  Administered 2014-05-11: 500 mL via INTRAVENOUS

## 2014-05-11 NOTE — H&P (Signed)
[facility]        Jesse Allen KCL:[id number] DOB: 12/14/1925 DOA: 05/10/2014  Referring physician: EDP PCP: Gildardo Cranker, DO  Specialists:   Chief Complaint: Confusion  HPI: Jesse Allen is a 81 y.o. male with a history  Of HTN, CHF, and Gout who was found by his neighbors tonight with increased confusion, and combativeness.  He was brought to the ED and found to have an Alcohol level of 289.    He lives alone, and his neighbors check in on him  Daily.   When his light was not off in his home tonight his neighbors came to check.   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   He denies any complaint otherwise, and does not wish to have alcohol treatment and does not know why he was brought to the  hospital from his home.   He is not a diabetic and is not on Insulin Rx but his initital Glucose was reported as 36 by EMS on site, he was administered IV Dextrose and in the ED his glucose levels were 128. Then 189.      Review of Systems:  Constitutional: No Weight Loss, No Weight Gain, Night Sweats, Fevers, Chills, Dizziness, Light Headedness, Fatigue, or Generalized Weakness HEENT: No Headaches, Difficulty Swallowing,Tooth/Dental Problems,Sore Throat,  No Sneezing, Rhinitis, Ear Ache, Nasal Congestion, or Post Nasal Drip,  Cardio-vascular:  No Chest pain, Orthopnea, PND, Edema in Lower Extremities, Anasarca, Dizziness, Palpitations  Resp: No Dyspnea, No DOE, No Productive Cough, No Non-Productive Cough, No Hemoptysis, No Wheezing.    GI: No Heartburn, Indigestion, Abdominal Pain, Nausea, Vomiting, Diarrhea, Constipation, Hematemesis, Hematochezia, Melena, Change in Bowel Habits,  Loss of Appetite  GU: No Dysuria, No Change in Color of Urine, No Urgency or Urinary Frequency, No Flank pain.  Musculoskeletal: No Joint Pain or Swelling, No Decreased Range of Motion, No Back Pain.  Neurologic: No Syncope, No  Seizures, Muscle Weakness, Paresthesia, Vision Disturbance or Loss, No Diplopia, No Vertigo, No Difficulty Walking,  Skin: No Rash or Lesions. Psych: No Change in Mood or Affect, No Depression or Anxiety, No Memory loss, No Confusion, or Hallucinations   Past Medical History  Diagnosis Date  . Gout   . Hypertension   . CHF (congestive heart failure)      History reviewed. No pertinent past surgical history.    Prior to Admission medications   Medication Sig Start Date End Date Taking? Authorizing Provider  aspirin EC 81 MG EC tablet Take 1 tablet (81 mg total) by mouth daily. 12/01/12   Barton Dubois, MD  colchicine 0.6 MG tablet Take 1 tablet (0.6 mg total) by mouth 2 (two) times daily. 05/02/14   Lauree Chandler, NP  folic acid (FOLVITE) 1 MG tablet Take 1 tablet (1 mg total) by mouth daily. Patient not taking: Reported on 02/26/2014 02/16/14   Hosie Poisson, MD  furosemide (LASIX) 80 MG tablet Take 1 tablet (80 mg total) by mouth daily. 05/02/14   Lauree Chandler, NP  gabapentin (NEURONTIN) 100 MG capsule Take 1 capsule (100 mg total) by mouth 2 (two) times daily. Patient not taking: Reported on 05/06/2014 12/01/12   Barton Dubois, MD  isosorbide-hydrALAZINE (BIDIL) 20-37.5 MG per tablet Take 1 tablet by mouth 3 (three) times daily. Patient not taking: Reported on 05/06/2014 02/13/14   Hosie Poisson, MD  Multiple Vitamin (MULTIVITAMIN WITH MINERALS) TABS  tablet Take 1 tablet by mouth daily. Patient not taking: Reported on 05/06/2014 02/13/14   Hosie Poisson, MD  omeprazole (PRILOSEC) 20 MG capsule Take 20 mg by mouth daily.    Historical Provider, MD  pantoprazole (PROTONIX) 40 MG tablet Take 1 tablet (40 mg total) by mouth daily at 12 noon. 12/01/12   Barton Dubois, MD  tamsulosin (FLOMAX) 0.4 MG CAPS capsule Take 1 capsule (0.4 mg total) by mouth daily after supper. 12/01/12   Barton Dubois, MD     No Known Allergies  Social History:  reports that he has never smoked. He does not  have any smokeless tobacco history on file. He reports that he drinks alcohol. He reports that he does not use illicit drugs.    No family history on file.     Physical Exam:  GEN:  Intoxicated Obese Elderly  81 y.o. African American  male examined and in no acute distress; cooperative with exam Filed Vitals:   05/11/14 0015 05/11/14 0020 05/11/14 0045 05/11/14 0215  BP: 151/66  149/61 146/57  Pulse: 78   84  Temp:  96.9 F (36.1 C)    TempSrc:  Rectal    Resp: 17  22 19   SpO2: 100%   100%   Blood pressure 146/57, pulse 84, temperature 96.9 F (36.1 C), temperature source Rectal, resp. rate 19, SpO2 100 %. PSYCH: She is alert and oriented x4; does not appear anxious does not appear depressed; affect is normal HEENT: Normocephalic and Atraumatic, Mucous membranes pink; PERRLA; EOM intact; Fundi:  Benign;  No scleral icterus, Nares: Patent, Oropharynx: Clear, Edentulous ,    Neck:  FROM, No Cervical Lymphadenopathy nor Thyromegaly or Carotid Bruit; No JVD; Breasts:: Not examined CHEST WALL: No tenderness CHEST: Normal respiration, clear to auscultation bilaterally HEART: Regular rate and rhythm; no murmurs rubs or gallops BACK: No kyphosis or scoliosis; No CVA tenderness ABDOMEN: Positive Bowel Sounds, Obese, Soft Non-Tender, No Rebound or Guarding; No Masses, No Organomegaly.    Rectal Exam: Not done EXTREMITIES: NoCyanosis, Clubbing, or Edema; No Ulcerations. Genitalia: not examined PULSES: 2+ and symmetric SKIN: Normal hydration no rash or ulceration CNS:  Alert and Oriented x 4, No Focal Deficits Vascular: pulses palpable throughout    Labs on Admission:  Basic Metabolic Panel:  Recent Labs Lab 05/11/14 0010 05/11/14 0129  NA 135 138  K 7.4* 4.3  CL 105 106  CO2 15*  --   GLUCOSE 128* 185*  BUN 24* 29*  CREATININE 1.20 1.30  CALCIUM 8.5  --    Liver Function Tests:  Recent Labs Lab 05/11/14 0010  AST 76*  ALT 16  ALKPHOS 84  BILITOT 1.9*  PROT 6.9    ALBUMIN 3.9   No results for input(s): LIPASE, AMYLASE in the last 168 hours. No results for input(s): AMMONIA in the last 168 hours. CBC:  Recent Labs Lab 05/11/14 0010 05/11/14 0129  WBC 8.9  --   NEUTROABS 6.4  --   HGB 13.8 13.6  HCT 41.9 40.0  MCV 93.7  --   PLT 260  --    Cardiac Enzymes:  Recent Labs Lab 05/11/14 0010  CKTOTAL 441*    BNP (last 3 results) No results for input(s): BNP in the last 8760 hours.  ProBNP (last 3 results)  Recent Labs  02/08/14 1655 02/13/14 0405  PROBNP 62377.0* 23222.0*    CBG:  Recent Labs Lab 05/11/14 0006 05/11/14 0212  GLUCAP 126* 132*    Radiological Exams on  Admission: Ct Head Wo Contrast  05/11/2014   CLINICAL DATA:  Altered mental status. Found on the floor. Low blood sugar.  EXAM: CT HEAD WITHOUT CONTRAST  TECHNIQUE: Contiguous axial images were obtained from the base of the skull through the vertex without intravenous contrast.  COMPARISON:  None.  FINDINGS: Diffuse cerebral atrophy. Mild ventricular dilatation consistent with central atrophy. Low-attenuation changes in the deep white matter consistent small vessel ischemia. No mass effect or midline shift. No abnormal extra-axial fluid collections. Gray-white matter junctions are distinct. Basal cisterns are not effaced. No evidence of acute intracranial hemorrhage. No depressed skull fractures. Visualized paranasal sinuses and mastoid air cells are not opacified. Vascular calcifications.  IMPRESSION: No acute intracranial abnormalities. Chronic atrophy and small vessel ischemic changes.   Electronically Signed   By: Lucienne Capers M.D.   On: 05/11/2014 00:43   Dg Chest Portable 1 View  05/11/2014   CLINICAL DATA:  Altered mental status. Patient was found on the floor. Low blood sugar. Nail bed cyanotic upon arrival but now improving.  EXAM: PORTABLE CHEST - 1 VIEW  COMPARISON:  02/08/2014  FINDINGS: Shallow inspiration. Normal heart size and pulmonary vascularity.  No focal airspace disease or consolidation in the lungs. No blunting of costophrenic angles. No pneumothorax. Mediastinal contours appear intact. Tortuous aorta. Degenerative changes in the spine and shoulders.  IMPRESSION: No active disease.   Electronically Signed   By: Lucienne Capers M.D.   On: 05/11/2014 00:17      Assessment/Plan:   81 y.o. male with  Active Problems:   1.   Alcohol intoxication and   Altered mental status- Due to #1    ETOH Level = 289, Does not Require admission at this time.      Continue  Gentle IVFs   Case Management Evaluation in AM     2.  Hypoglycemia   Resolved              Code Status:     FULL CODE      Family Communication:     No Family Present    Disposition Plan:    Case Management  Evaluation in AM and Discharge to Home        Time spent:  [facility] Pager [phone]   If [place] Please Contact the Day Rounding Team MD for [facility]  If 7PM-7AM, Please Contact Night-Floor Coverage  [email or web address] Password [email or web address] 05/11/2014, 2:27 AM     ADDENDUM:   Patient was seen and examined on 05/11/2014

## 2014-05-11 NOTE — ED Notes (Signed)
CBG 71 after cup of OJ and half a [place] sandwich.

## 2014-05-11 NOTE — ED Notes (Addendum)
Pt personal clothing wet, blue scrubs given to pt to wear out. Personal clothing placed in pt belonging bag and sent home with patient.

## 2014-05-11 NOTE — Discharge Planning (Signed)
Shyan Scalisi J. Clydene Laming, RN, BSN, [employer] [phone] Spoke with pt at bedside regarding discharge planning for [facility]. Offered pt list of home health agencies to choose from.  Pt chose [facility] to render services. Pearletha Forge, RN of [facility] notified.  No DME needs identified at this time.

## 2014-05-11 NOTE — Discharge Instructions (Signed)
Alcohol Use Disorder Alcohol use disorder is a mental disorder. It is not a one-time incident of heavy drinking. Alcohol use disorder is the excessive and uncontrollable use of alcohol over time that leads to problems with functioning in one or more areas of daily living. People with this disorder risk harming themselves and others when they drink to excess. Alcohol use disorder also can cause other mental disorders, such as mood and anxiety disorders, and serious physical problems. People with alcohol use disorder often misuse other drugs.  Alcohol use disorder is common and widespread. Some people with this disorder drink alcohol to cope with or escape from negative life events. Others drink to relieve chronic pain or symptoms of mental illness. People with a family history of alcohol use disorder are at higher risk of losing control and using alcohol to excess.  SYMPTOMS  Signs and symptoms of alcohol use disorder may include the following:   Consumption ofalcohol inlarger amounts or over a longer period of time than intended.  Multiple unsuccessful attempts to cutdown or control alcohol use.   A great deal of time spent obtaining alcohol, using alcohol, or recovering from the effects of alcohol (hangover).  A strong desire or urge to use alcohol (cravings).   Continued use of alcohol despite problems at work, school, or home because of alcohol use.   Continued use of alcohol despite problems in relationships because of alcohol use.  Continued use of alcohol in situations when it is physically hazardous, such as driving a car.  Continued use of alcohol despite awareness of a physical or psychological problem that is likely related to alcohol use. Physical problems related to alcohol use can involve the brain, heart, liver, stomach, and intestines. Psychological problems related to alcohol use include intoxication, depression, anxiety, psychosis, delirium, and dementia.   The need for  increased amounts of alcohol to achieve the same desired effect, or a decreased effect from the consumption of the same amount of alcohol (tolerance).  Withdrawal symptoms upon reducing or stopping alcohol use, or alcohol use to reduce or avoid withdrawal symptoms. Withdrawal symptoms include:  Racing heart.  Hand tremor.  Difficulty sleeping.  Nausea.  Vomiting.  Hallucinations.  Restlessness.  Seizures. DIAGNOSIS Alcohol use disorder is diagnosed through an assessment by your health care provider. Your health care provider may start by asking three or four questions to screen for excessive or problematic alcohol use. To confirm a diagnosis of alcohol use disorder, at least two symptoms must be present within a 12-month period. The severity of alcohol use disorder depends on the number of symptoms:  Mild--two or three.  Moderate--four or five.  Severe--six or more. Your health care provider may perform a physical exam or use results from lab tests to see if you have physical problems resulting from alcohol use. Your health care provider may refer you to a mental health professional for evaluation. TREATMENT  Some people with alcohol use disorder are able to reduce their alcohol use to low-risk levels. Some people with alcohol use disorder need to quit drinking alcohol. When necessary, mental health professionals with specialized training in substance use treatment can help. Your health care provider can help you decide how severe your alcohol use disorder is and what type of treatment you need. The following forms of treatment are available:   Detoxification. Detoxification involves the use of prescription medicines to prevent alcohol withdrawal symptoms in the first week after quitting. This is important for people with a history of symptoms   of withdrawal and for heavy drinkers who are likely to have withdrawal symptoms. Alcohol withdrawal can be dangerous and, in severe cases, cause  death. Detoxification is usually provided in a hospital or in-patient substance use treatment facility.  Counseling or talk therapy. Talk therapy is provided by substance use treatment counselors. It addresses the reasons people use alcohol and ways to keep them from drinking again. The goals of talk therapy are to help people with alcohol use disorder find healthy activities and ways to cope with life stress, to identify and avoid triggers for alcohol use, and to handle cravings, which can cause relapse.  Medicines.Different medicines can help treat alcohol use disorder through the following actions:  Decrease alcohol cravings.  Decrease the positive reward response felt from alcohol use.  Produce an uncomfortable physical reaction when alcohol is used (aversion therapy).  Support groups. Support groups are run by people who have quit drinking. They provide emotional support, advice, and guidance. These forms of treatment are often combined. Some people with alcohol use disorder benefit from intensive combination treatment provided by specialized substance use treatment centers. Both inpatient and outpatient treatment programs are available. Document Released: 04/13/2004 Document Revised: 07/21/2013 Document Reviewed: 06/13/2012 ExitCare Patient Information 2015 [employer]. This information is not intended to replace advice given to you by your health care provider. Make sure you discuss any questions you have with your health care provider.  

## 2014-05-11 NOTE — ED Notes (Signed)
Stepdaughter called and notified that pt is d/c from ED. Pt placed in waiting room with belongings in labeled belonging bag.

## 2014-05-11 NOTE — ED Notes (Addendum)
When asking pt how he is doing this morning, pt responds "Don't even ask me, no one cares how I am feeling, I've been sitting here all night." Pt not cooperative.

## 2014-05-11 NOTE — ED Notes (Signed)
While preparing to d/c pt from ED, temp 100.3 oral detected, rectal showed 100.5. MD notified. Give tylenol.

## 2014-05-11 NOTE — ED Notes (Signed)
Contacted patient's stepdaughter Chrys Racer and updated her on pt's status, phone [phone]

## 2014-05-11 NOTE — Discharge Planning (Signed)
Spoke to pt at bedside regarding discharge planning.  Pt states he was in [facility] for a month- until insurance kicked him out; and he could not participate in PT.  States his knees and hip joints are now bone on bone and no one is able to help him.  Pt states he wants to go back home because he "does pretty good" for himself.    NCM consulted [facility] for possible home visit and medication management.  NCM consulted Psychologist, sport and exercise for [employer].

## 2014-05-12 LAB — URINE CULTURE
Colony Count: NO GROWTH
Culture: NO GROWTH

## 2014-05-13 DIAGNOSIS — M109 Gout, unspecified: Secondary | ICD-10-CM | POA: Diagnosis not present

## 2014-05-13 DIAGNOSIS — I10 Essential (primary) hypertension: Secondary | ICD-10-CM | POA: Diagnosis not present

## 2014-05-13 DIAGNOSIS — F1010 Alcohol abuse, uncomplicated: Secondary | ICD-10-CM | POA: Diagnosis not present

## 2014-05-13 DIAGNOSIS — R739 Hyperglycemia, unspecified: Secondary | ICD-10-CM | POA: Diagnosis not present

## 2014-05-13 DIAGNOSIS — Z9114 Patient's other noncompliance with medication regimen: Secondary | ICD-10-CM | POA: Diagnosis not present

## 2014-05-14 LAB — CBG MONITORING, ED: Glucose-Capillary: 71 mg/dL (ref 70–99)

## 2014-05-22 ENCOUNTER — Ambulatory Visit: Payer: Self-pay | Admitting: Internal Medicine

## 2014-05-26 ENCOUNTER — Telehealth: Payer: Self-pay | Admitting: *Deleted

## 2014-05-26 NOTE — Telephone Encounter (Signed)
Margaretha Sheffield with [facility] called and stated that they are discharging patient because he is not cooperating with services. They call him the night before and tell him they are coming and he will not answer the door nor the calls the next day to let them in. He won't let them fill his pill box nor manage his medications. She just wanted to let you know she is discharging him from their services.

## 2014-05-27 NOTE — Telephone Encounter (Signed)
Noted  

## 2014-06-10 ENCOUNTER — Encounter: Payer: Self-pay | Admitting: Internal Medicine

## 2014-06-10 ENCOUNTER — Ambulatory Visit (INDEPENDENT_AMBULATORY_CARE_PROVIDER_SITE_OTHER): Payer: Medicare Other | Admitting: Internal Medicine

## 2014-06-10 VITALS — BP 168/90 | HR 88 | Temp 97.8°F | Resp 20 | Ht 71.0 in | Wt 210.0 lb

## 2014-06-10 DIAGNOSIS — G629 Polyneuropathy, unspecified: Secondary | ICD-10-CM

## 2014-06-10 DIAGNOSIS — I10 Essential (primary) hypertension: Secondary | ICD-10-CM

## 2014-06-10 DIAGNOSIS — Z9114 Patient's other noncompliance with medication regimen: Secondary | ICD-10-CM

## 2014-06-10 DIAGNOSIS — N183 Chronic kidney disease, stage 3 (moderate): Secondary | ICD-10-CM | POA: Diagnosis not present

## 2014-06-10 DIAGNOSIS — M129 Arthropathy, unspecified: Secondary | ICD-10-CM | POA: Diagnosis not present

## 2014-06-10 DIAGNOSIS — Z91148 Patient's other noncompliance with medication regimen for other reason: Secondary | ICD-10-CM

## 2014-06-10 DIAGNOSIS — I5022 Chronic systolic (congestive) heart failure: Secondary | ICD-10-CM

## 2014-06-10 MED ORDER — GABAPENTIN 100 MG PO CAPS: 100.0000 mg | ORAL_CAPSULE | Freq: Two times a day (BID) | ORAL | Status: DC

## 2014-06-10 MED ORDER — ISOSORB DINITRATE-HYDRALAZINE 20-37.5 MG PO TABS
1.0000 | ORAL_TABLET | Freq: Three times a day (TID) | ORAL | Status: DC
Start: 2014-06-10 — End: 2014-06-17

## 2014-06-10 MED ORDER — TRAMADOL HCL 50 MG PO TABS: 50.0000 mg | ORAL_TABLET | Freq: Two times a day (BID) | ORAL | Status: DC

## 2014-06-10 MED ORDER — FUROSEMIDE 40 MG PO TABS: 40.0000 mg | ORAL_TABLET | Freq: Every day | ORAL | Status: DC

## 2014-06-10 NOTE — Progress Notes (Signed)
Patient ID: Jesse Allen, male   DOB: Aug 16, 1925, 81 y.o.   MRN: [id number]    Facility  [facility]    Place of Service:   OFFICE   No Known Allergies  Chief Complaint  Patient presents with  . Medical Management of Chronic Issues    1 month follow-up on blood pressure and mobility. Patient was recently released from nursing home.   . Medication Management    Review and discuss all medicatons (patient is not taking several medications)    HPI:  81 yo male seen today for follow up. He c/o hip and knee pain. He has muscle spasms. He saw ortho last month and rec'd injections in both knees but it only lasted a few hrs. He would like to try a different pain med. He does not take gabapentin on a regular basis. He is also c/a increased urinary frequency on high dose lasix.   He is noncompliant with medications and medical recommendations.  He has chronic heart failure but does not take meds as rx  No recent gout attacks. He takes colcrys prn   Past Medical History  Diagnosis Date  . Gout   . Hypertension   . CHF (congestive heart failure)    History reviewed. No pertinent past surgical history. History   Social History  . Marital Status: Widowed    Spouse Name: N/A  . Number of Children: N/A  . Years of Education: N/A   Social History Main Topics  . Smoking status: Never Smoker   . Smokeless tobacco: Not on file  . Alcohol Use: Yes     Comment: today- "a double shot" (occasional use)  . Drug Use: No  . Sexual Activity: Not on file   Other Topics Concern  . None   Social History Narrative     Medications: Patient's Medications  New Prescriptions   No medications on file  Previous Medications   ASPIRIN EC 81 MG EC TABLET    Take 1 tablet (81 mg total) by mouth daily.   COLCHICINE 0.6 MG TABLET    Take 1 tablet (0.6 mg total) by mouth 2 (two) times daily.   FOLIC ACID (FOLVITE) 1 MG TABLET    Take 1 tablet (1 mg total) by mouth daily.   FUROSEMIDE (LASIX) 80 MG TABLET     Take 1 tablet (80 mg total) by mouth daily.   GABAPENTIN (NEURONTIN) 100 MG CAPSULE    Take 1 capsule (100 mg total) by mouth 2 (two) times daily.   ISOSORBIDE-HYDRALAZINE (BIDIL) 20-37.5 MG PER TABLET    Take 1 tablet by mouth 3 (three) times daily.   MULTIPLE VITAMIN (MULTIVITAMIN WITH MINERALS) TABS TABLET    Take 1 tablet by mouth daily.   OMEPRAZOLE (PRILOSEC) 20 MG CAPSULE    Take 20 mg by mouth daily.   PANTOPRAZOLE (PROTONIX) 40 MG TABLET    Take 1 tablet (40 mg total) by mouth daily at 12 noon.   TAMSULOSIN (FLOMAX) 0.4 MG CAPS CAPSULE    Take 1 capsule (0.4 mg total) by mouth daily after supper.  Modified Medications   No medications on file  Discontinued Medications   No medications on file     Review of Systems  Constitutional: Negative for chills, activity change and fatigue.  HENT: Negative for sore throat and trouble swallowing.   Eyes: Negative for visual disturbance.  Respiratory: Negative for cough, chest tightness and shortness of breath.   Cardiovascular: Negative for chest pain, palpitations and  leg swelling.  Gastrointestinal: Negative for nausea, vomiting, abdominal pain and blood in stool.  Genitourinary: Negative for urgency, frequency and difficulty urinating.  Musculoskeletal: Positive for back pain, joint swelling, arthralgias and gait problem.  Skin: Negative for rash.  Neurological: Positive for weakness and numbness. Negative for headaches.  Psychiatric/Behavioral: Negative for confusion and sleep disturbance. The patient is not nervous/anxious.     Filed Vitals:   06/10/14 1151  BP: 168/90  Pulse: 88  Temp: 97.8 F (36.6 C)  TempSrc: Oral  Resp: 20  Height: $Remove'5\' 11"'pUzaFVl$  (1.803 m)  Weight: 210 lb (95.255 kg)  SpO2: 98%   Body mass index is 29.3 kg/(m^2).  Physical Exam  Constitutional: He is oriented to person, place, and time. He appears well-developed and well-nourished.  Sitting in w/c. Awake and alert in NAD. Family present  HENT:    Mouth/Throat: Oropharynx is clear and moist.  Eyes: Pupils are equal, round, and reactive to light. No scleral icterus.  Neck: Neck supple. No thyromegaly present.  Cardiovascular: Normal rate, regular rhythm and intact distal pulses.   Occasional extrasystoles are present. Exam reveals gallop (s4). Exam reveals no friction rub.   Murmur (1/6SEM) heard. +1 pitting LE edema b/l. No calf TTP. No palpable cords.  Pulmonary/Chest: Effort normal and breath sounds normal. He has no wheezes. He has no rales. He exhibits no tenderness.  Abdominal: Soft. Bowel sounds are normal. He exhibits no distension, no abdominal bruit, no pulsatile midline mass and no mass. There is no tenderness. There is no rebound and no guarding.  Musculoskeletal: He exhibits edema and tenderness.  Lymphadenopathy:    He has no cervical adenopathy.  Neurological: He is alert and oriented to person, place, and time. He has normal reflexes.  Skin: Skin is warm and dry. No rash noted.  Psychiatric: He has a normal mood and affect. Judgment and thought content normal. He is agitated.     Labs reviewed: Admission on 05/10/2014, Discharged on 05/11/2014  Component Date Value Ref Range Status  . Sodium 05/11/2014 135  135 - 145 mmol/L Final  . Potassium 05/11/2014 7.4* 3.5 - 5.1 mmol/L Final   Comment: HEMOLYZED SPECIMEN, RESULTS MAY BE AFFECTED REPEATED TO VERIFY CRITICAL RESULT CALLED TO, READ BACK BY AND VERIFIED WITH: OLSEN E,RN 05/11/14 0058 WAYK   . Chloride 05/11/2014 105  96 - 112 mmol/L Final  . CO2 05/11/2014 15* 19 - 32 mmol/L Final  . Glucose, Bld 05/11/2014 128* 70 - 99 mg/dL Final  . BUN 05/11/2014 24* 6 - 23 mg/dL Final  . Creatinine, Ser 05/11/2014 1.20  0.50 - 1.35 mg/dL Final  . Calcium 05/11/2014 8.5  8.4 - 10.5 mg/dL Final  . Total Protein 05/11/2014 6.9  6.0 - 8.3 g/dL Final  . Albumin 05/11/2014 3.9  3.5 - 5.2 g/dL Final  . AST 05/11/2014 76* 0 - 37 U/L Final  . ALT 05/11/2014 16  0 - 53 U/L  Final  . Alkaline Phosphatase 05/11/2014 84  39 - 117 U/L Final  . Total Bilirubin 05/11/2014 1.9* 0.3 - 1.2 mg/dL Final  . GFR calc non Af Amer 05/11/2014 53* >90 mL/min Final  . GFR calc Af Amer 05/11/2014 61* >90 mL/min Final   Comment: (NOTE) The eGFR has been calculated using the CKD EPI equation. This calculation has not been validated in all clinical situations. eGFR's persistently <90 mL/min signify possible Chronic Kidney Disease.   . Anion gap 05/11/2014 15  5 - 15 Final  . WBC 05/11/2014  8.9  4.0 - 10.5 K/uL Final  . RBC 05/11/2014 4.47  4.22 - 5.81 MIL/uL Final  . Hemoglobin 05/11/2014 13.8  13.0 - 17.0 g/dL Final  . HCT 05/11/2014 41.9  39.0 - 52.0 % Final  . MCV 05/11/2014 93.7  78.0 - 100.0 fL Final  . MCH 05/11/2014 30.9  26.0 - 34.0 pg Final  . MCHC 05/11/2014 32.9  30.0 - 36.0 g/dL Final  . RDW 05/11/2014 15.1  11.5 - 15.5 % Final  . Platelets 05/11/2014 260  150 - 400 K/uL Final  . Neutrophils Relative % 05/11/2014 72  43 - 77 % Final  . Neutro Abs 05/11/2014 6.4  1.7 - 7.7 K/uL Final  . Lymphocytes Relative 05/11/2014 15  12 - 46 % Final  . Lymphs Abs 05/11/2014 1.4  0.7 - 4.0 K/uL Final  . Monocytes Relative 05/11/2014 12  3 - 12 % Final  . Monocytes Absolute 05/11/2014 1.1* 0.1 - 1.0 K/uL Final  . Eosinophils Relative 05/11/2014 1  0 - 5 % Final  . Eosinophils Absolute 05/11/2014 0.1  0.0 - 0.7 K/uL Final  . Basophils Relative 05/11/2014 0  0 - 1 % Final  . Basophils Absolute 05/11/2014 0.0  0.0 - 0.1 K/uL Final  . Troponin i, poc 05/11/2014 0.03  0.00 - 0.08 ng/mL Final  . Comment 3 05/11/2014          Final   Comment: Due to the release kinetics of cTnI, a negative result within the first hours of the onset of symptoms does not rule out myocardial infarction with certainty. If myocardial infarction is still suspected, repeat the test at appropriate intervals.   . Color, Urine 05/11/2014 YELLOW  YELLOW Final  . APPearance 05/11/2014 CLEAR  CLEAR Final   . Specific Gravity, Urine 05/11/2014 1.015  1.005 - 1.030 Final  . pH 05/11/2014 5.0  5.0 - 8.0 Final  . Glucose, UA 05/11/2014 NEGATIVE  NEGATIVE mg/dL Final  . Hgb urine dipstick 05/11/2014 NEGATIVE  NEGATIVE Final  . Bilirubin Urine 05/11/2014 NEGATIVE  NEGATIVE Final  . Ketones, ur 05/11/2014 15* NEGATIVE mg/dL Final  . Protein, ur 05/11/2014 NEGATIVE  NEGATIVE mg/dL Final  . Urobilinogen, UA 05/11/2014 1.0  0.0 - 1.0 mg/dL Final  . Nitrite 05/11/2014 NEGATIVE  NEGATIVE Final  . Leukocytes, UA 05/11/2014 NEGATIVE  NEGATIVE Final   MICROSCOPIC NOT DONE ON URINES WITH NEGATIVE PROTEIN, BLOOD, LEUKOCYTES, NITRITE, OR GLUCOSE <1000 mg/dL.  Marland Kitchen Specimen Description 05/11/2014 URINE, CATHETERIZED   Final  . Special Requests 05/11/2014 NONE   Final  . Colony Count 05/11/2014    Final                   Value:NO GROWTH Performed at [facility]   . Culture 05/11/2014    Final                   Value:NO GROWTH Performed at [facility]   . Report Status 05/11/2014 05/12/2014 FINAL   Final  . Acetaminophen (Tylenol), Serum 05/11/2014 <10.0* 10 - 30 ug/mL Final   Comment:        THERAPEUTIC CONCENTRATIONS VARY SIGNIFICANTLY. A RANGE OF 10-30 ug/mL MAY BE AN EFFECTIVE CONCENTRATION FOR MANY PATIENTS. HOWEVER, SOME ARE BEST TREATED AT CONCENTRATIONS OUTSIDE THIS RANGE. ACETAMINOPHEN CONCENTRATIONS >150 ug/mL AT 4 HOURS AFTER INGESTION AND >50 ug/mL AT 12 HOURS AFTER INGESTION ARE OFTEN ASSOCIATED WITH TOXIC REACTIONS.   Marland Kitchen Total CK 05/11/2014 441* 7 - 232  U/L Final  . Alcohol, Ethyl (B) 05/11/2014 249* 0 - 9 mg/dL Final   Comment:        LOWEST DETECTABLE LIMIT FOR SERUM ALCOHOL IS 11 mg/dL FOR MEDICAL PURPOSES ONLY   . Opiates 05/11/2014 NONE DETECTED  NONE DETECTED Final  . Cocaine 05/11/2014 NONE DETECTED  NONE DETECTED Final  . Benzodiazepines 05/11/2014 NONE DETECTED  NONE DETECTED Final  . Amphetamines 05/11/2014 NONE DETECTED  NONE DETECTED Final  .  Tetrahydrocannabinol 05/11/2014 NONE DETECTED  NONE DETECTED Final  . Barbiturates 05/11/2014 NONE DETECTED  NONE DETECTED Final   Comment:        DRUG SCREEN FOR MEDICAL PURPOSES ONLY.  IF CONFIRMATION IS NEEDED FOR ANY PURPOSE, NOTIFY LAB WITHIN 5 DAYS.        LOWEST DETECTABLE LIMITS FOR URINE DRUG SCREEN Drug Class       Cutoff (ng/mL) Amphetamine      1000 Barbiturate      200 Benzodiazepine   440 Tricyclics       102 Opiates          300 Cocaine          300 THC              50   . Salicylate Lvl 72/53/6644 <4.0  2.8 - 20.0 mg/dL Final  . Glucose-Capillary 05/11/2014 126* 70 - 99 mg/dL Final  . Sodium 05/11/2014 138  135 - 145 mmol/L Final  . Potassium 05/11/2014 4.3  3.5 - 5.1 mmol/L Final  . Chloride 05/11/2014 106  96 - 112 mmol/L Final  . BUN 05/11/2014 29* 6 - 23 mg/dL Final  . Creatinine, Ser 05/11/2014 1.30  0.50 - 1.35 mg/dL Final  . Glucose, Bld 05/11/2014 185* 70 - 99 mg/dL Final  . Calcium, Ion 05/11/2014 0.99* 1.13 - 1.30 mmol/L Final  . TCO2 05/11/2014 14  0 - 100 mmol/L Final  . Hemoglobin 05/11/2014 13.6  13.0 - 17.0 g/dL Final  . HCT 05/11/2014 40.0  39.0 - 52.0 % Final  . Glucose-Capillary 05/11/2014 132* 70 - 99 mg/dL Final  . Sodium 05/11/2014 137  135 - 145 mmol/L Final  . Potassium 05/11/2014 4.3  3.5 - 5.1 mmol/L Final  . Chloride 05/11/2014 106  96 - 112 mmol/L Final  . CO2 05/11/2014 16* 19 - 32 mmol/L Final  . Glucose, Bld 05/11/2014 174* 70 - 99 mg/dL Final  . BUN 05/11/2014 25* 6 - 23 mg/dL Final  . Creatinine, Ser 05/11/2014 1.10  0.50 - 1.35 mg/dL Final  . Calcium 05/11/2014 8.2* 8.4 - 10.5 mg/dL Final  . GFR calc non Af Amer 05/11/2014 58* >90 mL/min Final  . GFR calc Af Amer 05/11/2014 68* >90 mL/min Final   Comment: (NOTE) The eGFR has been calculated using the CKD EPI equation. This calculation has not been validated in all clinical situations. eGFR's persistently <90 mL/min signify possible Chronic Kidney Disease.   . Anion gap  05/11/2014 15  5 - 15 Final  . Glucose-Capillary 05/11/2014 61* 70 - 99 mg/dL Final  . Glucose-Capillary 05/11/2014 132* 70 - 99 mg/dL Final  . Comment 1 05/11/2014 Notify RN   Final  . Glucose-Capillary 05/11/2014 85  70 - 99 mg/dL Final  . Glucose-Capillary 05/11/2014 71  70 - 99 mg/dL Final     Assessment/Plan    ICD-9-CM ICD-10-CM   1. Arthritis involving multiple sites - pain uncontrolled 716.99 M12.9 traMADol (ULTRAM) 50 MG tablet  2. Chronic systolic heart failure - asymptomatic  although he is noncompliant 428.22 I50.22 furosemide (LASIX) 40 MG tablet     isosorbide-hydrALAZINE (BIDIL) 20-37.5 MG per tablet  3. CKD (chronic kidney disease), stage 3 (moderate) 585.3 N18.3   4. Essential hypertension - uncontrolled due to #5 401.9 I10   5. Noncompliance with medication regimen V15.81 Z91.14   6. Neuropathy 355.9 G62.9 gabapentin (NEURONTIN) 100 MG capsule    --Take fluid pill no later than 12 pm each day.  --TAKE ALL MEDICATIONS AS WRITTEN  --f/u with Ortho prn. Discussed whether further injections would be helpful or not as he probably has bone on bone arthritis. Will focus on pain control for now. He cannot take NSAIDs due to CKD  --will consider bedside commode to improve QOL. He is unable to get out bed in time to make it to bathroom due to uncontrolled pain.  --Follow up in 1 month for re-evaluation. Will need labs at next visit (CBC w diff and CMP)  Kamarii Carton S. Perlie Gold  [facility] [address] [place], [place] [address] [phone] Office (Wednesdays and Fridays 8 AM - 5 PM) [phone] Cell (Monday-Friday 8 AM - 5 PM)

## 2014-06-10 NOTE — Patient Instructions (Signed)
Take fluid pill no later than 12 pm each day.  TAKE ALL MEDICATIONS AS WRITTEN  Follow up in 1 month for re-evaluation. Will need labs at next visit.

## 2014-06-11 ENCOUNTER — Telehealth: Payer: Self-pay | Admitting: *Deleted

## 2014-06-11 NOTE — Telephone Encounter (Signed)
Berenice Primas, caregiver called and stated that the medication prescribed yesterday is too expensive $ 330.00 and didn't get it.   I tried calling her back to see which medication she is talking about and she had stepped out. LM for her to call me back.

## 2014-06-16 NOTE — Telephone Encounter (Signed)
Caregiver Berenice Primas) came by the office to get a script for a less expensive medication, informed her that I would have to send Dr. Eulas Post a message. I also stated that I would call her with the results and call it into the pharmacy. SRice  RMA

## 2014-06-17 ENCOUNTER — Other Ambulatory Visit: Payer: Self-pay | Admitting: *Deleted

## 2014-06-17 ENCOUNTER — Telehealth: Payer: Self-pay | Admitting: *Deleted

## 2014-06-17 MED ORDER — HYDRALAZINE HCL 25 MG PO TABS: ORAL_TABLET | ORAL | Status: DC

## 2014-06-17 MED ORDER — ISOSORBIDE DINITRATE 20 MG PO TABS: 20.0000 mg | ORAL_TABLET | Freq: Three times a day (TID) | ORAL | Status: DC

## 2014-06-17 NOTE — Telephone Encounter (Signed)
Eilene Ghazi, RMA at 06/16/2014 12:01 PM     Status: Signed       Expand All Collapse All   Caregiver Berenice Primas) came by the office to get a script for a less expensive medication, informed her that I would have to send Dr. Eulas Post a message. I also stated that I would call her with the results and call it into the pharmacy. [place] A May, CMA at 06/11/2014 1:18 PM     Status: Signed       Expand All Collapse All   Berenice Primas, caregiver called and stated that the medication prescribed yesterday is too expensive $ 330.00 and didn't get it.  I tried calling her back to see which medication she is talking about and she had stepped out. LM for her to call me back.        Called caregiver(Bertha) to inform her that prescription had been sent to pharmacy for pick up.

## 2014-07-02 ENCOUNTER — Telehealth: Payer: Self-pay

## 2014-07-02 DIAGNOSIS — R269 Unspecified abnormalities of gait and mobility: Secondary | ICD-10-CM

## 2014-07-02 DIAGNOSIS — R0600 Dyspnea, unspecified: Secondary | ICD-10-CM

## 2014-07-02 DIAGNOSIS — I5021 Acute systolic (congestive) heart failure: Secondary | ICD-10-CM

## 2014-07-02 DIAGNOSIS — M1990 Unspecified osteoarthritis, unspecified site: Secondary | ICD-10-CM

## 2014-07-02 NOTE — Telephone Encounter (Signed)
Patients step-daughter-in-law came into the office and filled out [place] Triage form. Patient was told to go to [facility] to get wheelchair. [facility] said that the order was over 47 days old, in addition at the time patient refused wheelchair order. Patient is now in need of a wheelchair (NOT motorized). Wheelchair should me foldable and not too heavy.   Please advise on request and diagnosis to go along with order  Side Notes: Last office visit 06/10/14, pending office visit 07/15/14

## 2014-07-03 NOTE — Telephone Encounter (Signed)
Ok for w/c RE abnormal gait, arthritis of multiple joints, dyspnea, chronic CHF

## 2014-07-06 NOTE — Telephone Encounter (Signed)
I called patient to discuss where to send order for wheelchair, patient said he was not sure and I should call his daughter Berenice Primas.  Left message on voicemail for Berenice Primas (daughter) to return call when available. Phone number- [phone]

## 2014-07-09 NOTE — Telephone Encounter (Signed)
Order was faxed to [facility]

## 2014-07-15 ENCOUNTER — Ambulatory Visit: Payer: Medicare Other | Admitting: Internal Medicine

## 2014-07-22 ENCOUNTER — Ambulatory Visit (INDEPENDENT_AMBULATORY_CARE_PROVIDER_SITE_OTHER): Payer: Medicare Other | Admitting: Internal Medicine

## 2014-07-22 ENCOUNTER — Encounter: Payer: Self-pay | Admitting: Internal Medicine

## 2014-07-22 VITALS — BP 118/70 | HR 63 | Temp 97.5°F | Resp 16

## 2014-07-22 DIAGNOSIS — M129 Arthropathy, unspecified: Secondary | ICD-10-CM

## 2014-07-22 DIAGNOSIS — R269 Unspecified abnormalities of gait and mobility: Secondary | ICD-10-CM | POA: Insufficient documentation

## 2014-07-22 DIAGNOSIS — R7303 Prediabetes: Secondary | ICD-10-CM

## 2014-07-22 DIAGNOSIS — I10 Essential (primary) hypertension: Secondary | ICD-10-CM

## 2014-07-22 DIAGNOSIS — N183 Chronic kidney disease, stage 3 (moderate): Secondary | ICD-10-CM | POA: Diagnosis not present

## 2014-07-22 DIAGNOSIS — R0600 Dyspnea, unspecified: Secondary | ICD-10-CM

## 2014-07-22 DIAGNOSIS — I5022 Chronic systolic (congestive) heart failure: Secondary | ICD-10-CM | POA: Insufficient documentation

## 2014-07-22 DIAGNOSIS — R252 Cramp and spasm: Secondary | ICD-10-CM

## 2014-07-22 DIAGNOSIS — G629 Polyneuropathy, unspecified: Secondary | ICD-10-CM | POA: Diagnosis not present

## 2014-07-22 DIAGNOSIS — R7309 Other abnormal glucose: Secondary | ICD-10-CM

## 2014-07-22 DIAGNOSIS — N189 Chronic kidney disease, unspecified: Secondary | ICD-10-CM | POA: Insufficient documentation

## 2014-07-22 NOTE — Progress Notes (Addendum)
Patient ID: Jesse Allen, male   DOB: 1925/10/22, 81 y.o.   MRN: [id number]    Location:   [facility]    Place of Service:   OFFICE   Chief Complaint  Patient presents with  . Medical Management of Chronic Issues    1 month follow-up on blood pressure and pain/cramps in legs, patient states " Sometimes those cramps hit me so bad and all I can do is hollar."  . Edema    Bilateral leg swelling off/on    HPI:  81 yo male seen today for f/u leg swelling and HTN. He continues to have leg cramps and swelling. He was not able to afford one of the meds that was Rx at last OV. Daughter unable to recall which one it was.   He has gout pain on tramadol. He takes it BID and need a RF.  He has CHF and currently takes lasix. No taking isosorbide.  Not sure if he is taking gabapentin BID. He has tried yellow mustard for leg cramps which helps.   Past Medical History  Diagnosis Date  . Gout   . Hypertension   . CHF (congestive heart failure)     History reviewed. No pertinent past surgical history.  Patient Care Team: Gildardo Cranker, DO as PCP - General (Internal Medicine)  History   Social History  . Marital Status: Widowed    Spouse Name: N/A  . Number of Children: N/A  . Years of Education: N/A   Occupational History  . Not on file.   Social History Main Topics  . Smoking status: Former Smoker -- 40 years  . Smokeless tobacco: Not on file     Comment: Quit 20 years ago as of 2016  . Alcohol Use: 0.0 oz/week    0 Standard drinks or equivalent per week     Comment: today- "a double shot" (occasional use)  . Drug Use: No  . Sexual Activity: Not on file   Other Topics Concern  . Not on file   Social History Narrative     reports that he has quit smoking. He does not have any smokeless tobacco history on file. He reports that he drinks alcohol. He reports that he does not use illicit drugs.  No Known Allergies  Medications: Patient's Medications  New Prescriptions   No  medications on file  Previous Medications   ASPIRIN EC 81 MG EC TABLET    Take 1 tablet (81 mg total) by mouth daily.   COLCHICINE 0.6 MG TABLET    Take 1 tablet (0.6 mg total) by mouth 2 (two) times daily.   FOLIC ACID (FOLVITE) 1 MG TABLET    Take 1 tablet (1 mg total) by mouth daily.   FUROSEMIDE (LASIX) 40 MG TABLET    Take 1 tablet (40 mg total) by mouth daily.   GABAPENTIN (NEURONTIN) 100 MG CAPSULE    Take 1 capsule (100 mg total) by mouth 2 (two) times daily.   HYDRALAZINE (APRESOLINE) 25 MG TABLET    Take 1.5 tablets by mouth three times daily.   IBUPROFEN (ADVIL,MOTRIN) 200 MG TABLET    Take 200 mg by mouth as needed.   ISOSORBIDE DINITRATE (ISORDIL) 20 MG TABLET    Take 1 tablet (20 mg total) by mouth 3 (three) times daily.   MULTIPLE VITAMIN (MULTIVITAMIN WITH MINERALS) TABS TABLET    Take 1 tablet by mouth daily.   PANTOPRAZOLE (PROTONIX) 40 MG TABLET    Take 1 tablet (  40 mg total) by mouth daily at 12 noon.   TAMSULOSIN (FLOMAX) 0.4 MG CAPS CAPSULE    Take 1 capsule (0.4 mg total) by mouth daily after supper.   TRAMADOL (ULTRAM) 50 MG TABLET    Take 1 tablet (50 mg total) by mouth 2 (two) times daily.  Modified Medications   No medications on file  Discontinued Medications   No medications on file    Review of Systems  Constitutional: Negative for chills, activity change and fatigue.  HENT: Negative for sore throat and trouble swallowing.   Eyes: Positive for discharge (clear). Negative for visual disturbance.  Respiratory: Positive for shortness of breath. Negative for cough and chest tightness.   Cardiovascular: Positive for leg swelling. Negative for chest pain and palpitations.  Gastrointestinal: Negative for nausea, vomiting, abdominal pain and blood in stool.  Genitourinary: Negative for urgency, frequency and difficulty urinating.  Musculoskeletal: Positive for arthralgias. Negative for gait problem.       He has calf cramps that are debilitating  Skin: Negative for  rash.  Neurological: Positive for numbness (in distal LE b/l). Negative for weakness and headaches.  Psychiatric/Behavioral: Negative for confusion and sleep disturbance. The patient is not nervous/anxious.     Filed Vitals:   07/22/14 0930  BP: 118/70  Pulse: 63  Temp: 97.5 F (36.4 C)  TempSrc: Oral  Resp: 16  SpO2: 98%   There is no weight on file to calculate BMI.  Physical Exam  Constitutional: He is oriented to person, place, and time. He appears well-developed and well-nourished. No distress.  Cardiovascular: Normal rate, regular rhythm and intact distal pulses.  Exam reveals gallop. Exam reveals no friction rub.   Murmur (1/6 SEM) heard. +1 pitting LE edema extending to knee b/l. Right anterior leg intact bulla. Increased erythema of leg b/l. Distal pulses palpable  Pulmonary/Chest: Effort normal. No respiratory distress. He has no wheezes. He has rales (R>L base).  Neurological: He is alert and oriented to person, place, and time.  Skin: Skin is warm, dry and intact. Rash noted. There is erythema.     Psychiatric: He has a normal mood and affect. His behavior is normal. Thought content normal.     Labs reviewed: Admission on 05/10/2014, Discharged on 05/11/2014  Component Date Value Ref Range Status  . Sodium 05/11/2014 135  135 - 145 mmol/L Final  . Potassium 05/11/2014 7.4* 3.5 - 5.1 mmol/L Final   Comment: HEMOLYZED SPECIMEN, RESULTS MAY BE AFFECTED REPEATED TO VERIFY CRITICAL RESULT CALLED TO, READ BACK BY AND VERIFIED WITH: OLSEN E,RN 05/11/14 0058 WAYK   . Chloride 05/11/2014 105  96 - 112 mmol/L Final  . CO2 05/11/2014 15* 19 - 32 mmol/L Final  . Glucose, Bld 05/11/2014 128* 70 - 99 mg/dL Final  . BUN 05/11/2014 24* 6 - 23 mg/dL Final  . Creatinine, Ser 05/11/2014 1.20  0.50 - 1.35 mg/dL Final  . Calcium 05/11/2014 8.5  8.4 - 10.5 mg/dL Final  . Total Protein 05/11/2014 6.9  6.0 - 8.3 g/dL Final  . Albumin 05/11/2014 3.9  3.5 - 5.2 g/dL Final  . AST  05/11/2014 76* 0 - 37 U/L Final  . ALT 05/11/2014 16  0 - 53 U/L Final  . Alkaline Phosphatase 05/11/2014 84  39 - 117 U/L Final  . Total Bilirubin 05/11/2014 1.9* 0.3 - 1.2 mg/dL Final  . GFR calc non Af Amer 05/11/2014 53* >90 mL/min Final  . GFR calc Af Amer 05/11/2014 61* >90 mL/min Final  Comment: (NOTE) The eGFR has been calculated using the CKD EPI equation. This calculation has not been validated in all clinical situations. eGFR's persistently <90 mL/min signify possible Chronic Kidney Disease.   . Anion gap 05/11/2014 15  5 - 15 Final  . WBC 05/11/2014 8.9  4.0 - 10.5 K/uL Final  . RBC 05/11/2014 4.47  4.22 - 5.81 MIL/uL Final  . Hemoglobin 05/11/2014 13.8  13.0 - 17.0 g/dL Final  . HCT 05/11/2014 41.9  39.0 - 52.0 % Final  . MCV 05/11/2014 93.7  78.0 - 100.0 fL Final  . MCH 05/11/2014 30.9  26.0 - 34.0 pg Final  . MCHC 05/11/2014 32.9  30.0 - 36.0 g/dL Final  . RDW 05/11/2014 15.1  11.5 - 15.5 % Final  . Platelets 05/11/2014 260  150 - 400 K/uL Final  . Neutrophils Relative % 05/11/2014 72  43 - 77 % Final  . Neutro Abs 05/11/2014 6.4  1.7 - 7.7 K/uL Final  . Lymphocytes Relative 05/11/2014 15  12 - 46 % Final  . Lymphs Abs 05/11/2014 1.4  0.7 - 4.0 K/uL Final  . Monocytes Relative 05/11/2014 12  3 - 12 % Final  . Monocytes Absolute 05/11/2014 1.1* 0.1 - 1.0 K/uL Final  . Eosinophils Relative 05/11/2014 1  0 - 5 % Final  . Eosinophils Absolute 05/11/2014 0.1  0.0 - 0.7 K/uL Final  . Basophils Relative 05/11/2014 0  0 - 1 % Final  . Basophils Absolute 05/11/2014 0.0  0.0 - 0.1 K/uL Final  . Troponin i, poc 05/11/2014 0.03  0.00 - 0.08 ng/mL Final  . Comment 3 05/11/2014          Final   Comment: Due to the release kinetics of cTnI, a negative result within the first hours of the onset of symptoms does not rule out myocardial infarction with certainty. If myocardial infarction is still suspected, repeat the test at appropriate intervals.   . Color, Urine 05/11/2014  YELLOW  YELLOW Final  . APPearance 05/11/2014 CLEAR  CLEAR Final  . Specific Gravity, Urine 05/11/2014 1.015  1.005 - 1.030 Final  . pH 05/11/2014 5.0  5.0 - 8.0 Final  . Glucose, UA 05/11/2014 NEGATIVE  NEGATIVE mg/dL Final  . Hgb urine dipstick 05/11/2014 NEGATIVE  NEGATIVE Final  . Bilirubin Urine 05/11/2014 NEGATIVE  NEGATIVE Final  . Ketones, ur 05/11/2014 15* NEGATIVE mg/dL Final  . Protein, ur 05/11/2014 NEGATIVE  NEGATIVE mg/dL Final  . Urobilinogen, UA 05/11/2014 1.0  0.0 - 1.0 mg/dL Final  . Nitrite 05/11/2014 NEGATIVE  NEGATIVE Final  . Leukocytes, UA 05/11/2014 NEGATIVE  NEGATIVE Final   MICROSCOPIC NOT DONE ON URINES WITH NEGATIVE PROTEIN, BLOOD, LEUKOCYTES, NITRITE, OR GLUCOSE <1000 mg/dL.  Marland Kitchen Specimen Description 05/11/2014 URINE, CATHETERIZED   Final  . Special Requests 05/11/2014 NONE   Final  . Colony Count 05/11/2014    Final                   Value:NO GROWTH Performed at [facility]   . Culture 05/11/2014    Final                   Value:NO GROWTH Performed at [facility]   . Report Status 05/11/2014 05/12/2014 FINAL   Final  . Acetaminophen (Tylenol), Serum 05/11/2014 <10.0* 10 - 30 ug/mL Final   Comment:        THERAPEUTIC CONCENTRATIONS VARY SIGNIFICANTLY. A RANGE OF 10-30 ug/mL MAY BE AN EFFECTIVE  CONCENTRATION FOR MANY PATIENTS. HOWEVER, SOME ARE BEST TREATED AT CONCENTRATIONS OUTSIDE THIS RANGE. ACETAMINOPHEN CONCENTRATIONS >150 ug/mL AT 4 HOURS AFTER INGESTION AND >50 ug/mL AT 12 HOURS AFTER INGESTION ARE OFTEN ASSOCIATED WITH TOXIC REACTIONS.   Marland Kitchen Total CK 05/11/2014 441* 7 - 232 U/L Final  . Alcohol, Ethyl (B) 05/11/2014 249* 0 - 9 mg/dL Final   Comment:        LOWEST DETECTABLE LIMIT FOR SERUM ALCOHOL IS 11 mg/dL FOR MEDICAL PURPOSES ONLY   . Opiates 05/11/2014 NONE DETECTED  NONE DETECTED Final  . Cocaine 05/11/2014 NONE DETECTED  NONE DETECTED Final  . Benzodiazepines 05/11/2014 NONE DETECTED  NONE DETECTED Final  .  Amphetamines 05/11/2014 NONE DETECTED  NONE DETECTED Final  . Tetrahydrocannabinol 05/11/2014 NONE DETECTED  NONE DETECTED Final  . Barbiturates 05/11/2014 NONE DETECTED  NONE DETECTED Final   Comment:        DRUG SCREEN FOR MEDICAL PURPOSES ONLY.  IF CONFIRMATION IS NEEDED FOR ANY PURPOSE, NOTIFY LAB WITHIN 5 DAYS.        LOWEST DETECTABLE LIMITS FOR URINE DRUG SCREEN Drug Class       Cutoff (ng/mL) Amphetamine      1000 Barbiturate      200 Benzodiazepine   485 Tricyclics       462 Opiates          300 Cocaine          300 THC              50   . Salicylate Lvl 70/35/0093 <4.0  2.8 - 20.0 mg/dL Final  . Glucose-Capillary 05/11/2014 126* 70 - 99 mg/dL Final  . Sodium 05/11/2014 138  135 - 145 mmol/L Final  . Potassium 05/11/2014 4.3  3.5 - 5.1 mmol/L Final  . Chloride 05/11/2014 106  96 - 112 mmol/L Final  . BUN 05/11/2014 29* 6 - 23 mg/dL Final  . Creatinine, Ser 05/11/2014 1.30  0.50 - 1.35 mg/dL Final  . Glucose, Bld 05/11/2014 185* 70 - 99 mg/dL Final  . Calcium, Ion 05/11/2014 0.99* 1.13 - 1.30 mmol/L Final  . TCO2 05/11/2014 14  0 - 100 mmol/L Final  . Hemoglobin 05/11/2014 13.6  13.0 - 17.0 g/dL Final  . HCT 05/11/2014 40.0  39.0 - 52.0 % Final  . Glucose-Capillary 05/11/2014 132* 70 - 99 mg/dL Final  . Sodium 05/11/2014 137  135 - 145 mmol/L Final  . Potassium 05/11/2014 4.3  3.5 - 5.1 mmol/L Final  . Chloride 05/11/2014 106  96 - 112 mmol/L Final  . CO2 05/11/2014 16* 19 - 32 mmol/L Final  . Glucose, Bld 05/11/2014 174* 70 - 99 mg/dL Final  . BUN 05/11/2014 25* 6 - 23 mg/dL Final  . Creatinine, Ser 05/11/2014 1.10  0.50 - 1.35 mg/dL Final  . Calcium 05/11/2014 8.2* 8.4 - 10.5 mg/dL Final  . GFR calc non Af Amer 05/11/2014 58* >90 mL/min Final  . GFR calc Af Amer 05/11/2014 68* >90 mL/min Final   Comment: (NOTE) The eGFR has been calculated using the CKD EPI equation. This calculation has not been validated in all clinical situations. eGFR's persistently <90  mL/min signify possible Chronic Kidney Disease.   . Anion gap 05/11/2014 15  5 - 15 Final  . Glucose-Capillary 05/11/2014 61* 70 - 99 mg/dL Final  . Glucose-Capillary 05/11/2014 132* 70 - 99 mg/dL Final  . Comment 1 05/11/2014 Notify RN   Final  . Glucose-Capillary 05/11/2014 85  70 - 99 mg/dL  Final  . Glucose-Capillary 05/11/2014 71  70 - 99 mg/dL Final    No results found.   Assessment/Plan   ICD-9-CM ICD-10-CM   1. Neuropathy possibly related to #9 - cont gabapentin 355.9 G62.9 Hemoglobin A1c  2. Cramp of both lower extremities - probably due to swelling vs RLS 729.82 R25.2 CMP     Magnesium  3. Dyspnea - related to #4 786.09 R06.00 CBC with Differential  4. Chronic systolic heart failure - stable on meds 428.22 I50.22   5. Arthritis involving multiple sites - cont tramadol 716.99 M12.9   6. Essential hypertension - stable on meds 401.9 I10   7. CKD (chronic kidney disease), stage 3 (moderate) 585.3 N18.3 CBC with Differential     CMP  8. Abnormality of gait - due to #5 781.2 R26.9   9. Prediabetes 790.29 R73.09 Hemoglobin A1c    --Continue current medications as ordered. Will reassess need for nitrate +/- BB at next OV  --Follow up in 1 month to recheck swelling, leg cramps and blood pressure.  --Recommend 1 tsp yellow mustard at bedtime to help with leg cramps. May need RLS med vs increase in gabapentin  --refer to [facility] due to arthritis and chronic heart failure  Yan Okray S. Perlie Gold  [facility] [address] [place], [place] [address] [phone] Cell (Monday-Friday 8 AM - 5 PM) [phone] After 5 PM and follow prompts

## 2014-07-22 NOTE — Patient Instructions (Addendum)
Reminder- bring copy of living will and health care power of attorney  Continue current medications as ordered  Follow up in 1 month to recheck swelling, leg cramps and blood pressure.  Recommend 1 tsp yellow mustard at bedtime to help with leg cramps  Will call with lab results

## 2014-07-22 NOTE — Addendum Note (Signed)
Addended by: Gildardo Cranker on: 07/22/2014 05:33 PM   Modules accepted: Orders

## 2014-07-23 LAB — COMPREHENSIVE METABOLIC PANEL
A/G RATIO: 1.8 (ref 1.1–2.5)
ALT: 323 IU/L — AB (ref 0–44)
AST: 94 IU/L — AB (ref 0–40)
Albumin: 3.7 g/dL (ref 3.5–4.7)
Alkaline Phosphatase: 91 IU/L (ref 39–117)
BUN/Creatinine Ratio: 28 — ABNORMAL HIGH (ref 10–22)
BUN: 41 mg/dL — ABNORMAL HIGH (ref 8–27)
Bilirubin Total: 1 mg/dL (ref 0.0–1.2)
CALCIUM: 8 mg/dL — AB (ref 8.6–10.2)
CHLORIDE: 98 mmol/L (ref 97–108)
CO2: 18 mmol/L (ref 18–29)
Creatinine, Ser: 1.44 mg/dL — ABNORMAL HIGH (ref 0.76–1.27)
GFR calc Af Amer: 50 mL/min/{1.73_m2} — ABNORMAL LOW (ref >59)
GFR calc non Af Amer: 43 mL/min/{1.73_m2} — ABNORMAL LOW (ref >59)
GLUCOSE: 142 mg/dL — AB (ref 65–99)
Globulin, Total: 2.1 g/dL (ref 1.5–4.5)
POTASSIUM: 4 mmol/L (ref 3.5–5.2)
Sodium: 137 mmol/L (ref 134–144)
Total Protein: 5.8 g/dL — ABNORMAL LOW (ref 6.0–8.5)

## 2014-07-23 LAB — CBC WITH DIFFERENTIAL/PLATELET
BASOS ABS: 0 10*3/uL (ref 0.0–0.2)
Basos: 0 %
EOS (ABSOLUTE): 0 10*3/uL (ref 0.0–0.4)
Eos: 0 %
HEMATOCRIT: 39.8 % (ref 37.5–51.0)
Hemoglobin: 12.7 g/dL (ref 12.6–17.7)
IMMATURE GRANULOCYTES: 0 %
Immature Grans (Abs): 0 10*3/uL (ref 0.0–0.1)
LYMPHS ABS: 0.4 10*3/uL — AB (ref 0.7–3.1)
Lymphs: 4 %
MCH: 30 pg (ref 26.6–33.0)
MCHC: 31.9 g/dL (ref 31.5–35.7)
MCV: 94 fL (ref 79–97)
MONOS ABS: 1 10*3/uL — AB (ref 0.1–0.9)
Monocytes: 10 %
NEUTROS ABS: 8.6 10*3/uL — AB (ref 1.4–7.0)
Neutrophils: 86 %
PLATELETS: 205 10*3/uL (ref 150–379)
RBC: 4.23 x10E6/uL (ref 4.14–5.80)
RDW: 16.5 % — AB (ref 12.3–15.4)
WBC: 10 10*3/uL (ref 3.4–10.8)

## 2014-07-23 LAB — MAGNESIUM: MAGNESIUM: 2.5 mg/dL — AB (ref 1.6–2.3)

## 2014-07-23 LAB — HEMOGLOBIN A1C
Est. average glucose Bld gHb Est-mCnc: 108 mg/dL
HEMOGLOBIN A1C: 5.4 % (ref 4.8–5.6)

## 2014-07-29 ENCOUNTER — Telehealth: Payer: Self-pay

## 2014-07-29 NOTE — Telephone Encounter (Signed)
Zigmund Daniel from [facility] called to inform Dr.Carter that patient declined their services. Patient stated he does not need home health. Patient is needing someone to stay with him full time and to care for him. Patient was not forthcoming with information. Patient states he fired his previous caregiver. Patient would not discuss with [place] home health as to why he fired his help. Patient would not give the name or number to any relatives.  Zigmund Daniel feels that patient is not safe in his home alone. Patient was given a list (provided by [place]) of available companies to contact to discuss getting a home aid that could stay with him.   Zigmund Daniel is highly recommended that we refer/contact APS (Adult Protective Services) to ensure patients safety.

## 2014-07-29 NOTE — Telephone Encounter (Signed)
Noted. What is the process to contact Adult protective services? I recommend we contact them as well.

## 2014-07-30 ENCOUNTER — Emergency Department (HOSPITAL_COMMUNITY): Payer: Medicare Other

## 2014-07-30 ENCOUNTER — Inpatient Hospital Stay (HOSPITAL_COMMUNITY)
Admission: EM | Admit: 2014-07-30 | Discharge: 2014-08-05 | DRG: 293 | Disposition: A | Discharge status: Skilled Nursing Facility | Payer: Medicare Other | Attending: Internal Medicine | Admitting: Internal Medicine

## 2014-07-30 ENCOUNTER — Other Ambulatory Visit (HOSPITAL_COMMUNITY): Payer: Self-pay

## 2014-07-30 ENCOUNTER — Encounter (HOSPITAL_COMMUNITY): Payer: Self-pay | Admitting: Emergency Medicine

## 2014-07-30 DIAGNOSIS — N189 Chronic kidney disease, unspecified: Secondary | ICD-10-CM | POA: Diagnosis not present

## 2014-07-30 DIAGNOSIS — M1990 Unspecified osteoarthritis, unspecified site: Secondary | ICD-10-CM | POA: Diagnosis present

## 2014-07-30 DIAGNOSIS — I5022 Chronic systolic (congestive) heart failure: Secondary | ICD-10-CM

## 2014-07-30 DIAGNOSIS — Z7982 Long term (current) use of aspirin: Secondary | ICD-10-CM

## 2014-07-30 DIAGNOSIS — K219 Gastro-esophageal reflux disease without esophagitis: Secondary | ICD-10-CM | POA: Diagnosis present

## 2014-07-30 DIAGNOSIS — R262 Difficulty in walking, not elsewhere classified: Secondary | ICD-10-CM | POA: Diagnosis present

## 2014-07-30 DIAGNOSIS — Z87891 Personal history of nicotine dependence: Secondary | ICD-10-CM

## 2014-07-30 DIAGNOSIS — Z9119 Patient's noncompliance with other medical treatment and regimen: Secondary | ICD-10-CM | POA: Diagnosis present

## 2014-07-30 DIAGNOSIS — I5021 Acute systolic (congestive) heart failure: Secondary | ICD-10-CM | POA: Diagnosis present

## 2014-07-30 DIAGNOSIS — R21 Rash and other nonspecific skin eruption: Secondary | ICD-10-CM | POA: Diagnosis present

## 2014-07-30 DIAGNOSIS — M25569 Pain in unspecified knee: Secondary | ICD-10-CM | POA: Diagnosis present

## 2014-07-30 DIAGNOSIS — N183 Chronic kidney disease, stage 3 (moderate): Secondary | ICD-10-CM | POA: Diagnosis present

## 2014-07-30 DIAGNOSIS — F1010 Alcohol abuse, uncomplicated: Secondary | ICD-10-CM | POA: Diagnosis present

## 2014-07-30 DIAGNOSIS — I129 Hypertensive chronic kidney disease with stage 1 through stage 4 chronic kidney disease, or unspecified chronic kidney disease: Secondary | ICD-10-CM | POA: Diagnosis present

## 2014-07-30 DIAGNOSIS — I509 Heart failure, unspecified: Secondary | ICD-10-CM

## 2014-07-30 DIAGNOSIS — M25559 Pain in unspecified hip: Secondary | ICD-10-CM | POA: Diagnosis present

## 2014-07-30 DIAGNOSIS — M7989 Other specified soft tissue disorders: Secondary | ICD-10-CM | POA: Diagnosis present

## 2014-07-30 DIAGNOSIS — Z9114 Patient's other noncompliance with medication regimen: Secondary | ICD-10-CM

## 2014-07-30 DIAGNOSIS — I5023 Acute on chronic systolic (congestive) heart failure: Principal | ICD-10-CM | POA: Diagnosis present

## 2014-07-30 DIAGNOSIS — R4182 Altered mental status, unspecified: Secondary | ICD-10-CM | POA: Diagnosis present

## 2014-07-30 DIAGNOSIS — I10 Essential (primary) hypertension: Secondary | ICD-10-CM | POA: Diagnosis present

## 2014-07-30 DIAGNOSIS — M109 Gout, unspecified: Secondary | ICD-10-CM | POA: Diagnosis present

## 2014-07-30 DIAGNOSIS — G629 Polyneuropathy, unspecified: Secondary | ICD-10-CM | POA: Diagnosis present

## 2014-07-30 DIAGNOSIS — Z91148 Patient's other noncompliance with medication regimen for other reason: Secondary | ICD-10-CM

## 2014-07-30 DIAGNOSIS — F4323 Adjustment disorder with mixed anxiety and depressed mood: Secondary | ICD-10-CM | POA: Diagnosis present

## 2014-07-30 DIAGNOSIS — M129 Arthropathy, unspecified: Secondary | ICD-10-CM | POA: Diagnosis present

## 2014-07-30 DIAGNOSIS — R0602 Shortness of breath: Secondary | ICD-10-CM

## 2014-07-30 LAB — URINE MICROSCOPIC-ADD ON

## 2014-07-30 LAB — CBC WITH DIFFERENTIAL/PLATELET
Basophils Absolute: 0 10*3/uL (ref 0.0–0.1)
Basophils Relative: 0 % (ref 0–1)
EOS PCT: 1 % (ref 0–5)
Eosinophils Absolute: 0.1 10*3/uL (ref 0.0–0.7)
HEMATOCRIT: 43.2 % (ref 39.0–52.0)
HEMOGLOBIN: 14 g/dL (ref 13.0–17.0)
LYMPHS ABS: 0.5 10*3/uL — AB (ref 0.7–4.0)
LYMPHS PCT: 5 % — AB (ref 12–46)
MCH: 30.3 pg (ref 26.0–34.0)
MCHC: 32.4 g/dL (ref 30.0–36.0)
MCV: 93.5 fL (ref 78.0–100.0)
MONOS PCT: 9 % (ref 3–12)
Monocytes Absolute: 0.8 10*3/uL (ref 0.1–1.0)
Neutro Abs: 7.5 10*3/uL (ref 1.7–7.7)
Neutrophils Relative %: 85 % — ABNORMAL HIGH (ref 43–77)
Platelets: 271 10*3/uL (ref 150–400)
RBC: 4.62 MIL/uL (ref 4.22–5.81)
RDW: 16 % — ABNORMAL HIGH (ref 11.5–15.5)
WBC: 8.8 10*3/uL (ref 4.0–10.5)

## 2014-07-30 LAB — COMPREHENSIVE METABOLIC PANEL
ALBUMIN: 3.5 g/dL (ref 3.5–5.0)
ALT: 89 U/L — ABNORMAL HIGH (ref 17–63)
AST: 33 U/L (ref 15–41)
Alkaline Phosphatase: 87 U/L (ref 38–126)
Anion gap: 5 (ref 5–15)
BUN: 37 mg/dL — ABNORMAL HIGH (ref 6–20)
CO2: 23 mmol/L (ref 22–32)
Calcium: 7.9 mg/dL — ABNORMAL LOW (ref 8.9–10.3)
Chloride: 107 mmol/L (ref 101–111)
Creatinine, Ser: 1.55 mg/dL — ABNORMAL HIGH (ref 0.61–1.24)
GFR calc Af Amer: 45 mL/min — ABNORMAL LOW (ref >60)
GFR calc non Af Amer: 39 mL/min — ABNORMAL LOW (ref >60)
Glucose, Bld: 120 mg/dL — ABNORMAL HIGH (ref 65–99)
POTASSIUM: 4.5 mmol/L (ref 3.5–5.1)
Sodium: 135 mmol/L (ref 135–145)
Total Bilirubin: 1 mg/dL (ref 0.3–1.2)
Total Protein: 6.1 g/dL — ABNORMAL LOW (ref 6.5–8.1)

## 2014-07-30 LAB — RAPID URINE DRUG SCREEN, HOSP PERFORMED
AMPHETAMINES: NOT DETECTED
BENZODIAZEPINES: NOT DETECTED
Barbiturates: NOT DETECTED
Cocaine: NOT DETECTED
OPIATES: NOT DETECTED
TETRAHYDROCANNABINOL: NOT DETECTED

## 2014-07-30 LAB — URINALYSIS, ROUTINE W REFLEX MICROSCOPIC
Glucose, UA: NEGATIVE mg/dL
HGB URINE DIPSTICK: NEGATIVE
Ketones, ur: NEGATIVE mg/dL
Nitrite: NEGATIVE
PH: 5 (ref 5.0–8.0)
Protein, ur: 30 mg/dL — AB
Specific Gravity, Urine: 1.019 (ref 1.005–1.030)
Urobilinogen, UA: 1 mg/dL (ref 0.0–1.0)

## 2014-07-30 LAB — ETHANOL: Alcohol, Ethyl (B): <5 mg/dL (ref <5)

## 2014-07-30 LAB — BRAIN NATRIURETIC PEPTIDE: B Natriuretic Peptide: 2824.4 pg/mL — ABNORMAL HIGH (ref 0.0–100.0)

## 2014-07-30 LAB — TROPONIN I: Troponin I: 0.03 ng/mL (ref <0.031)

## 2014-07-30 MED ORDER — ASPIRIN EC 81 MG PO TBEC
81.0000 mg | DELAYED_RELEASE_TABLET | Freq: Every day | ORAL | Status: DC
  Administered 2014-07-30 – 2014-08-04 (×6): 81 mg via ORAL
  Filled 2014-07-30 (×7): qty 1

## 2014-07-30 MED ORDER — FUROSEMIDE 10 MG/ML IJ SOLN
40.0000 mg | Freq: Two times a day (BID) | INTRAMUSCULAR | Status: DC
  Administered 2014-07-30 – 2014-08-03 (×8): 40 mg via INTRAVENOUS
  Filled 2014-07-30 (×8): qty 4

## 2014-07-30 MED ORDER — PANTOPRAZOLE SODIUM 40 MG PO TBEC
40.0000 mg | DELAYED_RELEASE_TABLET | Freq: Every day | ORAL | Status: DC
  Administered 2014-07-30 – 2014-08-04 (×6): 40 mg via ORAL
  Filled 2014-07-30 (×7): qty 1

## 2014-07-30 MED ORDER — SODIUM CHLORIDE 0.9 % IV SOLN: 250.0000 mL | INTRAVENOUS | Status: DC | PRN

## 2014-07-30 MED ORDER — FUROSEMIDE 10 MG/ML IJ SOLN
40.0000 mg | Freq: Once | INTRAMUSCULAR | Status: AC
  Administered 2014-07-30: 40 mg via INTRAVENOUS
  Filled 2014-07-30: qty 4

## 2014-07-30 MED ORDER — ACETAMINOPHEN 325 MG PO TABS: 650.0000 mg | ORAL_TABLET | ORAL | Status: DC | PRN

## 2014-07-30 MED ORDER — ENOXAPARIN SODIUM 40 MG/0.4ML ~~LOC~~ SOLN
40.0000 mg | SUBCUTANEOUS | Status: DC
Start: 2014-07-30 — End: 2014-08-05
  Administered 2014-07-30 – 2014-08-04 (×6): 40 mg via SUBCUTANEOUS
  Filled 2014-07-30 (×7): qty 0.4

## 2014-07-30 MED ORDER — OXYCODONE-ACETAMINOPHEN 5-325 MG PO TABS
1.0000 | ORAL_TABLET | Freq: Three times a day (TID) | ORAL | Status: DC | PRN
Start: 2014-07-30 — End: 2014-08-05
  Administered 2014-07-30 – 2014-08-02 (×3): 1 via ORAL
  Filled 2014-07-30 (×3): qty 1

## 2014-07-30 MED ORDER — SODIUM CHLORIDE 0.9 % IJ SOLN
3.0000 mL | INTRAMUSCULAR | Status: DC | PRN
Start: 2014-07-30 — End: 2014-08-05

## 2014-07-30 MED ORDER — FOLIC ACID 1 MG PO TABS
1.0000 mg | ORAL_TABLET | Freq: Every day | ORAL | Status: DC
  Administered 2014-07-30 – 2014-08-04 (×6): 1 mg via ORAL
  Filled 2014-07-30 (×8): qty 1

## 2014-07-30 MED ORDER — ONDANSETRON HCL 4 MG/2ML IJ SOLN: 4.0000 mg | Freq: Four times a day (QID) | INTRAMUSCULAR | Status: DC | PRN

## 2014-07-30 MED ORDER — GABAPENTIN 100 MG PO CAPS
100.0000 mg | ORAL_CAPSULE | Freq: Two times a day (BID) | ORAL | Status: DC
  Administered 2014-07-30 – 2014-08-05 (×12): 100 mg via ORAL
  Filled 2014-07-30 (×12): qty 1

## 2014-07-30 MED ORDER — ADULT MULTIVITAMIN W/MINERALS CH
1.0000 | ORAL_TABLET | Freq: Every day | ORAL | Status: DC
  Administered 2014-07-30 – 2014-08-04 (×6): 1 via ORAL
  Filled 2014-07-30 (×7): qty 1

## 2014-07-30 MED ORDER — TAMSULOSIN HCL 0.4 MG PO CAPS
0.4000 mg | ORAL_CAPSULE | Freq: Every day | ORAL | Status: DC
Start: 2014-07-30 — End: 2014-08-05
  Administered 2014-07-30 – 2014-08-04 (×6): 0.4 mg via ORAL
  Filled 2014-07-30 (×7): qty 1

## 2014-07-30 MED ORDER — COLCHICINE 0.6 MG PO TABS
0.6000 mg | ORAL_TABLET | Freq: Two times a day (BID) | ORAL | Status: DC
  Administered 2014-07-30 – 2014-08-05 (×12): 0.6 mg via ORAL
  Filled 2014-07-30 (×12): qty 1

## 2014-07-30 MED ORDER — ISOSORBIDE DINITRATE 20 MG PO TABS
20.0000 mg | ORAL_TABLET | Freq: Three times a day (TID) | ORAL | Status: DC
  Administered 2014-07-30 – 2014-08-01 (×6): 20 mg via ORAL
  Filled 2014-07-30 (×8): qty 1

## 2014-07-30 MED ORDER — SODIUM CHLORIDE 0.9 % IJ SOLN
3.0000 mL | Freq: Two times a day (BID) | INTRAMUSCULAR | Status: DC
  Administered 2014-07-30 – 2014-08-05 (×12): 3 mL via INTRAVENOUS

## 2014-07-30 NOTE — Care Management Note (Signed)
Case Management Note  Patient Details  Name: Jesse Allen MRN: [id number] Date of Birth: May 24, 1925  Subjective/Objective:   81 y/o m admitted w/chf.QK:MMNOTRR CHF.Non compliant. APS referral.         Action/Plan:From home.Recommend psych cons.   Expected Discharge Date:   (unknown)               Expected Discharge Plan:  [place]  In-House Referral:   (Per ED CM note-APS referral-not safe @ home.)  Discharge planning Services  CM Consult  Post Acute Care Choice:    Choice offered to:     DME Arranged:    DME Agency:     HH Arranged:    HH Agency:     Status of Service:  In process, will continue to follow  Medicare Important Message Given:    Date Medicare IM Given:    Medicare IM give by:    Date Additional Medicare IM Given:    Additional Medicare Important Message give by:     If discussed at [place] of Stay Meetings, dates discussed:    Additional Comments:  Dessa Phi, RN 07/30/2014, 4:24 PM

## 2014-07-30 NOTE — H&P (Signed)
PCP:   Gildardo Cranker, DO   Chief Complaint:  Swelling not better  HPI: 81 yo male h/o etoh abuse, noncompliance with meds, chronic chf systolic, htn comes in with a friend for swelling in his legs that have not gotten any better with his home diuretics.  He has broken out in a rash (bullous) from all the swelling.  No fevers.  Pt is very angry and says he has not idea why he is here.  His friend has left but per ER report, the friend reported that he is not taking any of his medications.  It appears through chart review, his pcp tried to arrange [facility] but pt refused this and in the process Adult protective services in involved.  Pt denies any pain, and is overall uncooperative with answering any questions.  He repeatedly says "none of yall ever listen to me."  He does say he doesn't have any medications at home to take, do not know if this is accurate.  Review of Systems:  unobtainable  Past Medical History: Past Medical History  Diagnosis Date  . Gout   . Hypertension   . CHF (congestive heart failure)    History reviewed. No pertinent past surgical history.  Medications: Prior to Admission medications   Medication Sig Start Date End Date Taking? Authorizing Provider  furosemide (LASIX) 40 MG tablet Take 1 tablet (40 mg total) by mouth daily. 06/10/14  Yes Gildardo Cranker, DO  gabapentin (NEURONTIN) 100 MG capsule Take 1 capsule (100 mg total) by mouth 2 (two) times daily. 06/10/14  Yes Gildardo Cranker, DO  ibuprofen (ADVIL,MOTRIN) 200 MG tablet Take 200 mg by mouth every 4 (four) hours as needed for moderate pain.    Yes Historical Provider, MD  pantoprazole (PROTONIX) 40 MG tablet Take 1 tablet (40 mg total) by mouth daily at 12 noon. 12/01/12  Yes Barton Dubois, MD  tamsulosin (FLOMAX) 0.4 MG CAPS capsule Take 1 capsule (0.4 mg total) by mouth daily after supper. 12/01/12  Yes Barton Dubois, MD  traMADol (ULTRAM) 50 MG tablet Take 1 tablet (50 mg total) by mouth 2 (two) times daily. 06/10/14   Yes Gildardo Cranker, DO  aspirin EC 81 MG EC tablet Take 1 tablet (81 mg total) by mouth daily. 12/01/12   Barton Dubois, MD  colchicine 0.6 MG tablet Take 1 tablet (0.6 mg total) by mouth 2 (two) times daily. 05/02/14   Lauree Chandler, NP  folic acid (FOLVITE) 1 MG tablet Take 1 tablet (1 mg total) by mouth daily. 02/16/14   Hosie Poisson, MD  hydrALAZINE (APRESOLINE) 25 MG tablet Take 1.5 tablets by mouth three times daily. 06/17/14   Gildardo Cranker, DO  isosorbide dinitrate (ISORDIL) 20 MG tablet Take 1 tablet (20 mg total) by mouth 3 (three) times daily. Patient not taking: Reported on 07/22/2014 06/17/14   Gildardo Cranker, DO  Multiple Vitamin (MULTIVITAMIN WITH MINERALS) TABS tablet Take 1 tablet by mouth daily. 02/13/14   Hosie Poisson, MD    Allergies:  No Known Allergies  Social History:  reports that he has quit smoking. He does not have any smokeless tobacco history on file. He reports that he drinks alcohol. He reports that he does not use illicit drugs.  Family History: unobtainable  Physical Exam: Filed Vitals:   07/30/14 1039 07/30/14 1418 07/30/14 1514 07/30/14 1515  BP: 146/71 148/77  127/59  Pulse: 85 73  57  Temp: 98.2 F (36.8 C)   97.6 F (36.4 C)  TempSrc: Oral  Oral  Resp: 18 18  18   Height:   5\' 11"  (1.803 m)   Weight:   94.6 kg (208 lb 8.9 oz)   SpO2: 100% 97%  100%   General appearance: alert, no distress and uncooperative Head: Normocephalic, without obvious abnormality, atraumatic Eyes: negative Nose: Nares normal. Septum midline. Mucosa normal. No drainage or sinus tenderness. Neck: no JVD and supple, symmetrical, trachea midline Lungs: clear to auscultation bilaterally Heart: regular rate and rhythm, S1, S2 normal, no murmur, click, rub or gallop Abdomen: soft, non-tender; bowel sounds normal; no masses,  no organomegaly Extremities: edema 2-3+ ble up to mid calf with bullous lesions from swellingone has ruptured. Pulses: 2+ and symmetric Skin: Skin  color, texture, turgor normal. No rashes or lesions Neurologic: Grossly normal    Labs on Admission:   Recent Labs  07/30/14 1200  NA 135  K 4.5  CL 107  CO2 23  GLUCOSE 120*  BUN 37*  CREATININE 1.55*  CALCIUM 7.9*    Recent Labs  07/30/14 1200  AST 33  ALT 89*  ALKPHOS 87  BILITOT 1.0  PROT 6.1*  ALBUMIN 3.5    Recent Labs  07/30/14 1200  WBC 8.8  NEUTROABS 7.5  HGB 14.0  HCT 43.2  MCV 93.5  PLT 271    Recent Labs  07/30/14 1200  TROPONINI 0.03   Radiological Exams on Admission: Dg Chest 2 View  07/30/2014   CLINICAL DATA:  CHF.  Leg swelling.  EXAM: CHEST  2 VIEW  COMPARISON:  05/10/2014  FINDINGS: Cardiac silhouette remains enlarged. Thoracic aorta is tortuous. There is new pulmonary vascular congestion with some indistinctness of the pulmonary vasculature. Mild interstitial densities are present in the left lower lung. There is new confluent airspace opacity in the right lower lobe. There is fissural thickening, and there are small right larger than left pleural effusions. No pneumothorax or acute osseous abnormality is identified.  IMPRESSION: 1. Cardiomegaly with new pulmonary vascular congestion, small bilateral pleural effusions, and likely early interstitial edema. 2. Right lower lobe airspace opacity could reflect superimposed pneumonia.   Electronically Signed   By: Logan Bores   On: 07/30/2014 12:17    Assessment/Plan  81 yo male with acute on chronic systolic congestive heart failure exacerbation highly suspect from noncompliance with medications and query competence  Principal Problem:   Acute systolic CHF (congestive heart failure)-  Supposed to be on lasix 40 mg po daily at home.  Will place on lasix 40mg  iv q 12 hours.  chf pathway, no echo done here since 11/15 and EF was 35% so will order echo for in am.    Active Problems:   SOB (shortness of breath)-  Due to above, not tachypneic or hypoxic   Essential hypertension-  stable    Alcohol abuse-  Unknown use.  etoh level here is not elevated.   CKD (chronic kidney disease)-  Monitor cr closely with increasing diuretic use   Noncompliance with medication regimen- noted   Competency -  Pt may likely need psych evaluation for competency prior to discharge home  Admit to tele bed.  Full code.  dispo likely at least 2-3 days pending response to diuresis and determination of competency.   Lawonda Pretlow A 07/30/2014, 3:27 PM

## 2014-07-30 NOTE — Telephone Encounter (Signed)
I called adult protective services @ [phone], a report was placed for this patient yesterday by someone else. APS will provide the patient with some resources to help him link up with the [place] to see what benefits are available to help him. An official report takes about 45 minutes over the phone, that was not placed. The rep for APS thinks its good to start with getting patient linked with a Education officer, museum and find out what resources are available to help him.

## 2014-07-30 NOTE — ED Notes (Signed)
Attempted IV twice, unsuccessful. 2nd RN will try.
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
## 2014-07-30 NOTE — Progress Notes (Signed)
Pt arrived from ED on stretcher, slid self to bed. ORiented to callbell and environment. VSS. POC discussed.

## 2014-07-30 NOTE — ED Notes (Addendum)
Per EMS: Pt has bilateral blisters and open sore to legs, pt is wheelchair bound for the past 2 weeks. Pt from home
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
## 2014-07-30 NOTE — Progress Notes (Addendum)
Regarding home medications: the patient brought six prescription bottles with him and reports taking them as prescribed. The rest of the meds on his list he is unable to confirm if he takes them or not. Additionally, his visitor indicated to the pharmacy technician that he thinks the patient is non-compliant with most of his medications. He lives alone and no one assists him with his medications.   Romeo Rabon, PharmD, pager [phone]. 07/30/2014,11:05 AM.

## 2014-07-30 NOTE — ED Notes (Signed)
Patient transported to X-ray 

## 2014-07-30 NOTE — ED Notes (Signed)
Bed: WA03 Expected date:  Expected time:  Means of arrival:  Comments: EMS- BLE blisters, noncompliant w/ medications

## 2014-07-30 NOTE — ED Notes (Signed)
MD at bedside. 

## 2014-07-31 ENCOUNTER — Inpatient Hospital Stay (HOSPITAL_COMMUNITY): Payer: Medicare Other

## 2014-07-31 DIAGNOSIS — I5021 Acute systolic (congestive) heart failure: Secondary | ICD-10-CM

## 2014-07-31 DIAGNOSIS — I10 Essential (primary) hypertension: Secondary | ICD-10-CM

## 2014-07-31 DIAGNOSIS — I509 Heart failure, unspecified: Secondary | ICD-10-CM

## 2014-07-31 DIAGNOSIS — F4323 Adjustment disorder with mixed anxiety and depressed mood: Secondary | ICD-10-CM | POA: Diagnosis present

## 2014-07-31 DIAGNOSIS — N183 Chronic kidney disease, stage 3 (moderate): Secondary | ICD-10-CM

## 2014-07-31 LAB — BASIC METABOLIC PANEL
Anion gap: 10 (ref 5–15)
BUN: 36 mg/dL — ABNORMAL HIGH (ref 6–20)
CALCIUM: 7.7 mg/dL — AB (ref 8.9–10.3)
CHLORIDE: 104 mmol/L (ref 101–111)
CO2: 23 mmol/L (ref 22–32)
Creatinine, Ser: 1.73 mg/dL — ABNORMAL HIGH (ref 0.61–1.24)
GFR calc non Af Amer: 34 mL/min — ABNORMAL LOW (ref >60)
GFR, EST AFRICAN AMERICAN: 39 mL/min — AB (ref >60)
Glucose, Bld: 121 mg/dL — ABNORMAL HIGH (ref 65–99)
Potassium: 4.2 mmol/L (ref 3.5–5.1)
Sodium: 137 mmol/L (ref 135–145)

## 2014-07-31 MED ORDER — FOLIC ACID 1 MG PO TABS: 1.0000 mg | ORAL_TABLET | Freq: Every day | ORAL | Status: DC

## 2014-07-31 MED ORDER — THIAMINE HCL 100 MG/ML IJ SOLN
100.0000 mg | Freq: Every day | INTRAMUSCULAR | Status: DC
  Filled 2014-07-31 (×6): qty 1

## 2014-07-31 MED ORDER — VITAMIN B-1 100 MG PO TABS
100.0000 mg | ORAL_TABLET | Freq: Every day | ORAL | Status: DC
  Administered 2014-07-31 – 2014-08-05 (×6): 100 mg via ORAL
  Filled 2014-07-31 (×6): qty 1

## 2014-07-31 MED ORDER — LORAZEPAM 1 MG PO TABS: 1.0000 mg | ORAL_TABLET | Freq: Four times a day (QID) | ORAL | Status: AC | PRN

## 2014-07-31 MED ORDER — LORAZEPAM 1 MG PO TABS: 0.0000 mg | ORAL_TABLET | Freq: Four times a day (QID) | ORAL | Status: AC

## 2014-07-31 MED ORDER — LORAZEPAM 1 MG PO TABS
0.0000 mg | ORAL_TABLET | Freq: Two times a day (BID) | ORAL | Status: AC
Start: 2014-08-02 — End: 2014-08-04

## 2014-07-31 MED ORDER — LORAZEPAM 2 MG/ML IJ SOLN: 1.0000 mg | Freq: Four times a day (QID) | INTRAMUSCULAR | Status: AC | PRN

## 2014-07-31 MED ORDER — ADULT MULTIVITAMIN W/MINERALS CH: 1.0000 | ORAL_TABLET | Freq: Every day | ORAL | Status: DC

## 2014-07-31 NOTE — Clinical Social Work Placement (Signed)
CSW spoke with patient & step-daughter, Hoyle Sauer re: discharge planning. Daughter informed CSW that patient has a caregiver who comes 3-4 times a week and stays with him most of the day. CSW reviewed psychiatrist's note indicating that patient does not have capacity to make his own medical decisions at this time. CSW relayed information to step-daughter who states that she will try to talk to him & have his nephew talk to him as well. Patient had been to [facility] in the past. CSW will provided bed offers to patient/step-daughter.     Raynaldo Opitz, [facility] Clinical Social Worker cell #: [phone]    CLINICAL SOCIAL WORK PLACEMENT  NOTE  Date:  07/31/2014  Patient Details  Name: Jesse Allen MRN: [id number] Date of Birth: Oct 21, 1925  Clinical Social Work is seeking post-discharge placement for this patient at the [place] level of care (*CSW will initial, date and re-position this form in  chart as items are completed):  Yes   Patient/family provided with [facility] Work Department's list of facilities offering this level of care within the geographic area requested by the patient (or if unable, by the patient's family).  Yes   Patient/family informed of their freedom to choose among providers that offer the needed level of care, that participate in Medicare, Medicaid or managed care program needed by the patient, have an available bed and are willing to accept the patient.  Yes   Patient/family informed of [facility]'s ownership interest in [facility] and [facility], as well as of the fact that they are under no obligation to receive care at these facilities.  PASRR submitted to EDS on 07/31/14     PASRR number received on 07/31/14     Existing PASRR number confirmed on 07/31/14     FL2 transmitted to all facilities in geographic area requested by pt/family on 07/31/14     FL2 transmitted to all  facilities within larger geographic area on       Patient informed that his/her managed care company has contracts with or will negotiate with certain facilities, including the following:            Patient/family informed of bed offers received.  Patient chooses bed at       Physician recommends and patient chooses bed at      Patient to be transferred to   on  .  Patient to be transferred to facility by       Patient family notified on   of transfer.  Name of family member notified:        PHYSICIAN       Additional Comment:    _______________________________________________ Standley Brooking, LCSW 07/31/2014, 3:56 PM

## 2014-07-31 NOTE — Progress Notes (Signed)
Pt pending psych consult for capacity.   Jesse Allen, [place] [facility]  [employer] [phone]

## 2014-07-31 NOTE — Consult Note (Signed)
[facility] Face-to-Face Psychiatry Consult   Reason for Consult:  Capacity evaluation  Referring Physician:  Dr. Venetia Constable Patient Identification: Jesse Allen MRN:  [id number] Principal Diagnosis: Adjustment disorder with mixed anxiety and depressed mood Diagnosis:   Patient Active Problem List   Diagnosis Date Noted  . Noncompliance with medication regimen [Z91.14] 07/30/2014  . CHF (congestive heart failure) [I50.9] 07/30/2014  . Congestive heart disease [I50.9]   . Prediabetes [R73.09] 07/22/2014  . Chronic systolic heart failure [I43.32] 07/22/2014  . CKD (chronic kidney disease) [N18.9] 07/22/2014  . Abnormality of gait [R26.9] 07/22/2014  . Neuropathy [G62.9] 07/22/2014  . Alcohol intoxication [F10.129] 05/11/2014  . Altered mental status [R41.82] 05/11/2014  . Dehydration [E86.0]   . Alcohol abuse [F10.10]   . Dyspnea [R06.00]   . Acute CHF [I50.9] 02/08/2014  . Acute systolic CHF (congestive heart failure) [I50.21] 02/08/2014  . SOB (shortness of breath) [R06.02] 02/08/2014  . Essential hypertension [I10] 02/08/2014  . AKI (acute kidney injury) [N17.9] 02/08/2014  . Arthritis involving multiple sites [M12.9] 02/08/2014  . Elevated transaminase level [R74.0] 02/08/2014  . Gait abnormality [R26.9] 11/29/2012  . Elevated CPK [R74.8] 11/29/2012  . Musculoskeletal thigh pain [M79.606] 11/29/2012  . Hematuria [R31.9] 11/29/2012  . Gout [M10.9]     Total Time spent with patient: 1 hour  Subjective:   Jesse Allen is a 81 y.o. male patient admitted with swollen legs and non compliance with medication management.  HPI: Jesse Allen is a 81 years old male admitted to [facility] with the swollen legs and noncompliant with medication management. Psychiatric consultation and evaluation requested for capacity evaluation to make his own medical decisions and living arrangements. Patient has been noncompliant with medication and has limited knowledge about his heart problems and  does not know what medication is supposed to take. Patient has a mild symptoms of anxiety and depression secondary to becoming older and unable to care for himself. Patient stated some neighbors checks on him from time to time but no body helps at home. Patient stated he does whatever he can do but does not ask for help. Patient has intact cognitions including his orientation, memory and language functions. Patient also reportedly drinks alcohol mostly bourbon until 2 months ago but stopped because he cannot get to the store. Patient stated he is having trouble walking outside the house, mostly walking between the bed in room to the kitchen only. Patient reported he has a significant restrictions about his knees and hips. Patient has no family members living with him reportedly wife died and has no children. Patient has some extended family members make his stepchildren checking once in a while. Patient denies depression, anxiety, psychosis and has no suicidal or homicidal ideation. Patient has no history of mental health treatment. Reportedly and interpreted the service was involved secondary to not able to care for himself and limited psychosocial support. 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 Complains about swelling legs, knee pain, hip pain and decreased mobility, unable to access medical doctors and medications.   Past Medical History:  Past Medical History  Diagnosis Date  . Gout   . Hypertension   . CHF (congestive heart failure)    History reviewed. No pertinent past surgical history. Family History: No family history on file. Social History:  History  Alcohol Use  . 0.0 oz/week  . 0 Standard drinks or equivalent per week    Comment: today- "  a double shot" (occasional use)     History  Drug Use No    History   Social History  . Marital Status: Widowed    Spouse Name: N/A  . Number of Children: N/A  . Years  of Education: N/A   Social History Main Topics  . Smoking status: Former Smoker -- 40 years  . Smokeless tobacco: Not on file     Comment: Quit 20 years ago as of 2016  . Alcohol Use: 0.0 oz/week    0 Standard drinks or equivalent per week     Comment: today- "a double shot" (occasional use)  . Drug Use: No  . Sexual Activity: Not on file   Other Topics Concern  . None   Social History Narrative   Additional Social History:                          Allergies:  No Known Allergies  Labs:  Results for orders placed or performed during the hospital encounter of 07/30/14 (from the past 48 hour(s))  Comprehensive metabolic panel     Status: Abnormal   Collection Time: 07/30/14 12:00 PM  Result Value Ref Range   Sodium 135 135 - 145 mmol/L   Potassium 4.5 3.5 - 5.1 mmol/L   Chloride 107 101 - 111 mmol/L   CO2 23 22 - 32 mmol/L   Glucose, Bld 120 (H) 65 - 99 mg/dL   BUN 37 (H) 6 - 20 mg/dL   Creatinine, Ser 1.55 (H) 0.61 - 1.24 mg/dL   Calcium 7.9 (L) 8.9 - 10.3 mg/dL   Total Protein 6.1 (L) 6.5 - 8.1 g/dL   Albumin 3.5 3.5 - 5.0 g/dL   AST 33 15 - 41 U/L   ALT 89 (H) 17 - 63 U/L   Alkaline Phosphatase 87 38 - 126 U/L   Total Bilirubin 1.0 0.3 - 1.2 mg/dL   GFR calc non Af Amer 39 (L) >60 mL/min   GFR calc Af Amer 45 (L) >60 mL/min    Comment: (NOTE) The eGFR has been calculated using the CKD EPI equation. This calculation has not been validated in all clinical situations. eGFR's persistently <60 mL/min signify possible Chronic Kidney Disease.    Anion gap 5 5 - 15  Troponin I     Status: None   Collection Time: 07/30/14 12:00 PM  Result Value Ref Range   Troponin I 0.03 <0.031 ng/mL    Comment:        NO INDICATION OF MYOCARDIAL INJURY.   Brain natriuretic peptide     Status: Abnormal   Collection Time: 07/30/14 12:00 PM  Result Value Ref Range   B Natriuretic Peptide 2824.4 (H) 0.0 - 100.0 pg/mL  CBC with Differential     Status: Abnormal    Collection Time: 07/30/14 12:00 PM  Result Value Ref Range   WBC 8.8 4.0 - 10.5 K/uL   RBC 4.62 4.22 - 5.81 MIL/uL   Hemoglobin 14.0 13.0 - 17.0 g/dL   HCT 43.2 39.0 - 52.0 %   MCV 93.5 78.0 - 100.0 fL   MCH 30.3 26.0 - 34.0 pg   MCHC 32.4 30.0 - 36.0 g/dL   RDW 16.0 (H) 11.5 - 15.5 %   Platelets 271 150 - 400 K/uL   Neutrophils Relative % 85 (H) 43 - 77 %   Neutro Abs 7.5 1.7 - 7.7 K/uL   Lymphocytes Relative 5 (L) 12 - 46 %  Lymphs Abs 0.5 (L) 0.7 - 4.0 K/uL   Monocytes Relative 9 3 - 12 %   Monocytes Absolute 0.8 0.1 - 1.0 K/uL   Eosinophils Relative 1 0 - 5 %   Eosinophils Absolute 0.1 0.0 - 0.7 K/uL   Basophils Relative 0 0 - 1 %   Basophils Absolute 0.0 0.0 - 0.1 K/uL  Ethanol     Status: None   Collection Time: 07/30/14 12:00 PM  Result Value Ref Range   Alcohol, Ethyl (B) <5 <5 mg/dL    Comment:        LOWEST DETECTABLE LIMIT FOR SERUM ALCOHOL IS 11 mg/dL FOR MEDICAL PURPOSES ONLY   Urinalysis, Routine w reflex microscopic     Status: Abnormal   Collection Time: 07/30/14  1:28 PM  Result Value Ref Range   Color, Urine AMBER (A) YELLOW    Comment: BIOCHEMICALS MAY BE AFFECTED BY COLOR   APPearance CLOUDY (A) CLEAR   Specific Gravity, Urine 1.019 1.005 - 1.030   pH 5.0 5.0 - 8.0   Glucose, UA NEGATIVE NEGATIVE mg/dL   Hgb urine dipstick NEGATIVE NEGATIVE   Bilirubin Urine SMALL (A) NEGATIVE   Ketones, ur NEGATIVE NEGATIVE mg/dL   Protein, ur 30 (A) NEGATIVE mg/dL   Urobilinogen, UA 1.0 0.0 - 1.0 mg/dL   Nitrite NEGATIVE NEGATIVE   Leukocytes, UA SMALL (A) NEGATIVE  Drug screen panel, emergency     Status: None   Collection Time: 07/30/14  1:28 PM  Result Value Ref Range   Opiates NONE DETECTED NONE DETECTED   Cocaine NONE DETECTED NONE DETECTED   Benzodiazepines NONE DETECTED NONE DETECTED   Amphetamines NONE DETECTED NONE DETECTED   Tetrahydrocannabinol NONE DETECTED NONE DETECTED   Barbiturates NONE DETECTED NONE DETECTED    Comment:        DRUG SCREEN  FOR MEDICAL PURPOSES ONLY.  IF CONFIRMATION IS NEEDED FOR ANY PURPOSE, NOTIFY LAB WITHIN 5 DAYS.        LOWEST DETECTABLE LIMITS FOR URINE DRUG SCREEN Drug Class       Cutoff (ng/mL) Amphetamine      1000 Barbiturate      200 Benzodiazepine   588 Tricyclics       325 Opiates          300 Cocaine          300 THC              50   Urine microscopic-add on     Status: Abnormal   Collection Time: 07/30/14  1:28 PM  Result Value Ref Range   WBC, UA 7-10 <3 WBC/hpf   Bacteria, UA FEW (A) RARE   Casts HYALINE CASTS (A) NEGATIVE   Urine-Other MUCOUS PRESENT   Basic metabolic panel     Status: Abnormal   Collection Time: 07/31/14  4:10 AM  Result Value Ref Range   Sodium 137 135 - 145 mmol/L   Potassium 4.2 3.5 - 5.1 mmol/L   Chloride 104 101 - 111 mmol/L   CO2 23 22 - 32 mmol/L   Glucose, Bld 121 (H) 65 - 99 mg/dL   BUN 36 (H) 6 - 20 mg/dL   Creatinine, Ser 1.73 (H) 0.61 - 1.24 mg/dL   Calcium 7.7 (L) 8.9 - 10.3 mg/dL   GFR calc non Af Amer 34 (L) >60 mL/min   GFR calc Af Amer 39 (L) >60 mL/min    Comment: (NOTE) The eGFR has been calculated using the CKD EPI equation.  This calculation has not been validated in all clinical situations. eGFR's persistently <60 mL/min signify possible Chronic Kidney Disease.    Anion gap 10 5 - 15    Vitals: Blood pressure 141/65, pulse 72, temperature 98.1 F (36.7 C), temperature source Oral, resp. rate 16, height _0  (1.803 m), weight 94.212 kg (207 lb 11.2 oz), SpO2 96 %.  Risk to Self: Is patient at risk for suicide?: No Risk to Others:   Prior Inpatient Therapy:   Prior Outpatient Therapy:    Current Facility-Administered Medications  Medication Dose Route Frequency Provider Last Rate Last Dose  . 0.9 %  sodium chloride infusion  250 mL Intravenous PRN Phillips Grout, MD      . acetaminophen (TYLENOL) tablet 650 mg  650 mg Oral Q4H PRN Phillips Grout, MD      . aspirin EC tablet 81 mg  81 mg Oral Daily Phillips Grout, MD   81  mg at 07/30/14 1836  . colchicine tablet 0.6 mg  0.6 mg Oral BID Phillips Grout, MD   0.6 mg at 07/31/14 0854  . enoxaparin (LOVENOX) injection 40 mg  40 mg Subcutaneous Q24H Phillips Grout, MD   40 mg at 07/30/14 1958  . folic acid (FOLVITE) tablet 1 mg  1 mg Oral Daily Phillips Grout, MD   1 mg at 07/30/14 1836  . furosemide (LASIX) injection 40 mg  40 mg Intravenous Q12H Phillips Grout, MD   40 mg at 07/31/14 0356  . gabapentin (NEURONTIN) capsule 100 mg  100 mg Oral BID Phillips Grout, MD   100 mg at 07/31/14 0854  . isosorbide dinitrate (ISORDIL) tablet 20 mg  20 mg Oral TID Phillips Grout, MD   20 mg at 07/31/14 0854  . multivitamin with minerals tablet 1 tablet  1 tablet Oral Daily Phillips Grout, MD   1 tablet at 07/30/14 1836  . ondansetron (ZOFRAN) injection 4 mg  4 mg Intravenous Q6H PRN Phillips Grout, MD      . oxyCODONE-acetaminophen (PERCOCET/ROXICET) 5-325 MG per tablet 1 tablet  1 tablet Oral Q8H PRN Phillips Grout, MD   1 tablet at 07/30/14 1957  . pantoprazole (PROTONIX) EC tablet 40 mg  40 mg Oral Daily Phillips Grout, MD   40 mg at 07/30/14 1836  . sodium chloride 0.9 % injection 3 mL  3 mL Intravenous Q12H Phillips Grout, MD   3 mL at 07/31/14 0854  . sodium chloride 0.9 % injection 3 mL  3 mL Intravenous PRN Phillips Grout, MD      . tamsulosin [facility]) capsule 0.4 mg  0.4 mg Oral QPC supper Phillips Grout, MD   0.4 mg at 07/30/14 1836    Musculoskeletal: Strength & Muscle Tone: decreased Gait & Station: unable to stand Patient leans: N/A  Psychiatric Specialty Exam: Physical Exam as per history and physical   ROS swollen legs, knee pains, hip pains but denied depression, anxiety chest pain, shortness of breath. Negative for the review of systems other than history of present illness.   Blood pressure 141/65, pulse 72, temperature 98.1 F (36.7 C), temperature source Oral, resp. rate 16, height _1  (1.803 m), weight 94.212 kg (207 lb 11.2 oz), SpO2 96 %.Body mass  index is 28.98 kg/(m^2).  General Appearance: Casual  Eye Contact::  Good  Speech:  Clear and Coherent  Volume:  Normal  Mood:  Euthymic  Affect:  Appropriate  and Congruent  Thought Process:  Coherent and Goal Directed  Orientation:  Full (Time, Place, and Person)  Thought Content:  WDL  Suicidal Thoughts:  No  Homicidal Thoughts:  No  Memory:  Immediate;   Fair Recent;   Fair  Judgement:  Fair  Insight:  Shallow  Psychomotor Activity:  Decreased  Concentration:  Fair  Recall:  [place] of Knowledge:Good  Language: Good  Akathisia:  NA  Handed:  Right  AIMS (if indicated):     Assets:  Communication Skills Desire for Improvement Financial Resources/Insurance Housing Leisure Time Resilience  ADL's:  Impaired  Cognition: Impaired,  Mild  Sleep:      Medical Decision Making: Review of Psycho-Social Stressors (1), Review or order clinical lab tests (1), Established Problem, Worsening (2), Review or order medicine tests (1), Review of Medication Regimen & Side Effects (2) and Review of New Medication or Change in Dosage (2)  Treatment Plan Summary: Daily contact with patient to assess and evaluate symptoms and progress in treatment and Medication management  Plan: Patient presented with significant history of chronic medical conditions, alcohol abuse and currently noncompliant with medication management which resulted swollen legs decreased mobility unable to care for himself. Patient has difficulty to understand complex and complicated medical conditions and required treatment needs.   Patient does not meet criteria for capacity to make his own medical decisions based on my evaluation. Patient is also willing to be placed out of home during my visit.  Patient does not meet criteria for psychiatric inpatient admission. Supportive therapy provided about ongoing stressors.  Disposition: Patient benefit from out-of-home placement especially skilled nursing facility where he  can receive 24-hour service as he was not able to cope up with his daily living and noncompliant with medication management at home. Patient has a limited psychosocial support system.   Tiasia Weberg,JANARDHAHA R. 07/31/2014 10:16 AM

## 2014-07-31 NOTE — Progress Notes (Signed)
  Echocardiogram 2D Echocardiogram has been performed.  Jesse Allen 07/31/2014, 3:36 PM

## 2014-07-31 NOTE — Progress Notes (Signed)
[facility] PROGRESS NOTE Interim History: 81 yo male h/o etoh abuse, noncompliance with meds, chronic chf systolic, htn comes in with a friend for swelling in his legs that have not gotten any better with his home diuretics. Filed Weights   07/30/14 1514 07/31/14 0420  Weight: 94.6 kg (208 lb 8.9 oz) 94.212 kg (207 lb 11.2 oz)        Intake/Output Summary (Last 24 hours) at 07/31/14 1009 Last data filed at 07/31/14 0648  Gross per 24 hour  Intake    240 ml  Output   2300 ml  Net  -2060 ml     Assessment/Plan: Acute systolic CHF (congestive heart failure): - Last echo on 02/06/2014 showed an ejection fraction of 20%. He has been noncompliant with his home regimen. - He was started on IV Lasix and had good diuresis.  - Will continue monitor electrolytes. Continue strict I's and O's low-sodium diet and restrict his fluids.  Noncompliance with medication regimen: - Consult for capacity.  CKD (chronic kidney disease): - Creatinine seems to be close to baseline.  Alcohol abuse: - Monitor serial protocol continue thiamine and folate.  Essential hypertension - Seems to be controlled continue current regimen.     Code Status: full Family Communication: none  Disposition Plan: Home to 4 days.   Consultants:  none  Procedures: ECHO: pending  Antibiotics:  None  HPI/Subjective: No complains wants to be left alone.  Objective: Filed Vitals:   07/30/14 1515 07/30/14 2127 07/31/14 0420 07/31/14 0852  BP: 127/59 129/69 121/55 141/65  Pulse: 57 78 70 72  Temp: 97.6 F (36.4 C) 97.7 F (36.5 C) 98.1 F (36.7 C)   TempSrc: Oral Oral Oral   Resp: 18 20 18 16   Height:      Weight:   94.212 kg (207 lb 11.2 oz)   SpO2: 100% 100% 95% 96%     Exam:  General: Alert, awake, oriented x3, in no acute distress.  HEENT: No bruits, no goiter.  Heart: Regular rate and rhythm. Lower ext edema. Lungs: Good air movement, clear Abdomen: Soft, nontender,  nondistended, positive bowel sounds.  Neuro: Grossly intact, nonfocal.   Data Reviewed: Basic Metabolic Panel:  Recent Labs Lab 07/30/14 1200 07/31/14 0410  NA 135 137  K 4.5 4.2  CL 107 104  CO2 23 23  GLUCOSE 120* 121*  BUN 37* 36*  CREATININE 1.55* 1.73*  CALCIUM 7.9* 7.7*   Liver Function Tests:  Recent Labs Lab 07/30/14 1200  AST 33  ALT 89*  ALKPHOS 87  BILITOT 1.0  PROT 6.1*  ALBUMIN 3.5   No results for input(s): LIPASE, AMYLASE in the last 168 hours. No results for input(s): AMMONIA in the last 168 hours. CBC:  Recent Labs Lab 07/30/14 1200  WBC 8.8  NEUTROABS 7.5  HGB 14.0  HCT 43.2  MCV 93.5  PLT 271   Cardiac Enzymes:  Recent Labs Lab 07/30/14 1200  TROPONINI 0.03   BNP (last 3 results)  Recent Labs  07/30/14 1200  BNP 2824.4*    ProBNP (last 3 results)  Recent Labs  02/08/14 1655 02/13/14 0405  PROBNP 62377.0* 23222.0*    CBG: No results for input(s): GLUCAP in the last 168 hours.  No results found for this or any previous visit (from the past 240 hour(s)).   Studies: Dg Chest 2 View  07/30/2014   CLINICAL DATA:  CHF.  Leg swelling.  EXAM: CHEST  2 VIEW  COMPARISON:  05/10/2014  FINDINGS: Cardiac silhouette remains enlarged. Thoracic aorta is tortuous. There is new pulmonary vascular congestion with some indistinctness of the pulmonary vasculature. Mild interstitial densities are present in the left lower lung. There is new confluent airspace opacity in the right lower lobe. There is fissural thickening, and there are small right larger than left pleural effusions. No pneumothorax or acute osseous abnormality is identified.  IMPRESSION: 1. Cardiomegaly with new pulmonary vascular congestion, small bilateral pleural effusions, and likely early interstitial edema. 2. Right lower lobe airspace opacity could reflect superimposed pneumonia.   Electronically Signed   By: Logan Bores   On: 07/30/2014 12:17    Scheduled Meds: .  aspirin EC  81 mg Oral Daily  . colchicine  0.6 mg Oral BID  . enoxaparin (LOVENOX) injection  40 mg Subcutaneous Q24H  . folic acid  1 mg Oral Daily  . furosemide  40 mg Intravenous Q12H  . gabapentin  100 mg Oral BID  . isosorbide dinitrate  20 mg Oral TID  . multivitamin with minerals  1 tablet Oral Daily  . pantoprazole  40 mg Oral Daily  . sodium chloride  3 mL Intravenous Q12H  . tamsulosin  0.4 mg Oral QPC supper   Continuous Infusions:    Charlynne Cousins  [facility] Pager [phone]  If 7PM-7AM, please contact night-coverage at [email or web address], password [facility] 07/31/2014, 10:09 AM  LOS: 1 day

## 2014-07-31 NOTE — Consult Note (Signed)
WOC wound consult note Reason for Consult: Bilateral blistering to lower extremities.  Edema is resolving with Lasix.  Wound type: Skin blistering and splitting from edema.  Dry skin to feet.   Measurement: Right dorsal shin.  Ruptured serum filled blister.  10 cm x 6.2 cm x 0.1 cm Left lateral shin:  2 nonintact lesions present proximal 2 cm x 1 cm x 0.1 cm Distal 0.5 cm x 1 cm x 0.1 cm Wound bed: 100% pink and moist Drainage (amount, consistency, odor) Minimal serous weeping.  Periwound:Resolving erythema and edema Dressing procedure/placement/frequency:Cleanse lesions to bilateral shins.  Apply Mepitel silicone contact layer to wound bed.  Cover with 4x4 gauze and kerlix.  Secure with tape.  Change Tuesday and Friday and PRN soilage or strikethrough.  Will not follow at this time.  Please re-consult if needed.  Domenic Moras RN BSN [place] Pager [phone]

## 2014-07-31 NOTE — Evaluation (Signed)
Physical Therapy Evaluation Patient Details Name: Jesse Allen MRN: [id number] DOB: 04/13/1925 Today's Date: 07/31/2014   History of Present Illness  81 year old male h/o etoh abuse, noncompliance with meds, chronic CHF systolic, HTN, arthritis, gout admitted for swelling in his legs, diagnosed with acute systolic CHF.  Clinical Impression  Pt admitted with above diagnosis. Pt currently with functional limitations due to the deficits listed below (see PT Problem List).  Pt will benefit from skilled PT to increase their independence and safety with mobility to allow discharge to the venue listed below.   Pt requiring assist for bed mobility and transfers at this time therefore recommend SNF.     Follow Up Recommendations SNF;Supervision/Assistance - 24 hour    Equipment Recommendations  None recommended by PT    Recommendations for Other Services       Precautions / Restrictions Precautions Precautions: Fall Restrictions Weight Bearing Restrictions: No      Mobility  Bed Mobility Overal bed mobility: Needs Assistance Bed Mobility: Rolling;Sidelying to Sit Rolling: Mod assist Sidelying to sit: Mod assist       General bed mobility comments: assist for LEs to EOB, assist for trunk upright  Transfers Overall transfer level: Needs assistance Equipment used: Rolling walker (2 wheeled) Transfers: Sit to/from [employer] Transfers Sit to Stand: Min assist;From elevated surface Stand pivot transfers: Min assist       General transfer comment: verbal cues for safety, assist to steady, pt only agreeable to pivot to recliner, declined ambulation  Ambulation/Gait                Stairs            Wheelchair Mobility    Modified Rankin (Stroke Patients Only)       Balance Overall balance assessment: Needs assistance         Standing balance support: Bilateral upper extremity supported;During functional activity Standing balance-Leahy Scale:  Poor Standing balance comment: requires min assist and RW support for transfers                             Pertinent Vitals/Pain Pain Assessment: Faces Faces Pain Scale: Hurts little more Pain Location: reports joint pain and grimaces when PT donned socks (likely due to swelling) Pain Descriptors / Indicators: Aching Pain Intervention(s): Limited activity within patient's tolerance;Monitored during session;Repositioned    Home Living Family/patient expects to be discharged to:: Private residence Living Arrangements: Alone   Type of Home: [place]: One level Home Equipment: Environmental consultant - 2 wheels;Wheelchair - manual      Prior Function Level of Independence: Independent with assistive device(s)         Comments: 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      Journalist, newspaper        Extremity/Trunk Assessment   Upper Extremity Assessment: Generalized weakness           Lower Extremity Assessment: Generalized weakness         Communication   Communication: No difficulties  Cognition Arousal/Alertness: Awake/alert           Memory: Decreased short-term memory         General Comments: requires encouragement but follows commands/cues, does not remember why LEs are swollen    General Comments      Exercises        Assessment/Plan    PT Assessment Patient needs continued PT  services  PT Diagnosis Difficulty walking;Generalized weakness   PT Problem List Decreased strength;Decreased activity tolerance;Decreased balance;Decreased mobility;Decreased knowledge of use of DME  PT Treatment Interventions DME instruction;Gait training;Functional mobility training;Patient/family education;Therapeutic activities;Therapeutic exercise;Balance training   PT Goals (Current goals can be found in the Care Plan section) Acute Rehab PT Goals PT Goal Formulation: With patient Time For Goal Achievement: 08/14/14 Potential to Achieve Goals: Good     Frequency Min 3X/week   Barriers to discharge        Co-evaluation               End of Session   Activity Tolerance: Patient tolerated treatment well Patient left: in chair;with call bell/phone within reach;with chair alarm set           Time: 0768-0881 PT Time Calculation (min) (ACUTE ONLY): 16 min   Charges:   PT Evaluation $Initial PT Evaluation Tier I: 1 Procedure     PT G Codes:        Latravia Southgate,KATHrine E 07/31/2014, 12:23 PM Carmelia Bake, PT, DPT 07/31/2014 Pager: [phone]

## 2014-08-01 DIAGNOSIS — F4323 Adjustment disorder with mixed anxiety and depressed mood: Secondary | ICD-10-CM

## 2014-08-01 LAB — BASIC METABOLIC PANEL
Anion gap: 8 (ref 5–15)
BUN: 35 mg/dL — AB (ref 6–20)
CHLORIDE: 103 mmol/L (ref 101–111)
CO2: 26 mmol/L (ref 22–32)
CREATININE: 1.6 mg/dL — AB (ref 0.61–1.24)
Calcium: 7.4 mg/dL — ABNORMAL LOW (ref 8.9–10.3)
GFR calc non Af Amer: 37 mL/min — ABNORMAL LOW (ref >60)
GFR, EST AFRICAN AMERICAN: 43 mL/min — AB (ref >60)
GLUCOSE: 109 mg/dL — AB (ref 65–99)
Potassium: 4 mmol/L (ref 3.5–5.1)
Sodium: 137 mmol/L (ref 135–145)

## 2014-08-01 MED ORDER — ISOSORB DINITRATE-HYDRALAZINE 20-37.5 MG PO TABS
2.0000 | ORAL_TABLET | Freq: Two times a day (BID) | ORAL | Status: DC
  Administered 2014-08-01 – 2014-08-04 (×6): 2 via ORAL
  Filled 2014-08-01 (×7): qty 2

## 2014-08-01 NOTE — Progress Notes (Signed)
[facility] PROGRESS NOTE Interim History: 81 yo male h/o etoh abuse, noncompliance with meds, chronic chf systolic, htn comes in with a friend for swelling in his legs that have not gotten any better with his home diuretics. Filed Weights   07/30/14 1514 07/31/14 0420 08/01/14 0431  Weight: 94.6 kg (208 lb 8.9 oz) 94.212 kg (207 lb 11.2 oz) 88.814 kg (195 lb 12.8 oz)        Intake/Output Summary (Last 24 hours) at 08/01/14 0937 Last data filed at 08/01/14 0900  Gross per 24 hour  Intake    900 ml  Output   3450 ml  Net  -2550 ml     Assessment/Plan: Acute systolic CHF (congestive heart failure): - Last echo on 02/06/2014 showed an ejection fraction of 20%. He has been noncompliant with his home regimen. - Cont IV lasix will add BiDil. - Will continue monitor electrolytes. Continue strict I's and O's low-sodium diet and restrict his fluids. - weight cont to improve.  Noncompliance with medication regimen: - Consult for psyq and patient does not have capacity.  CKD (chronic kidney disease): - Creatinine seems to be close to baseline.  Alcohol abuse: - Monitor serial protocol continue thiamine and folate.  Essential hypertension - Seems to be controlled continue current regimen.    Code Status: full Family Communication: none  Disposition Plan: SNF to 2 days.   Consultants:  none  Procedures: ECHO: pending  Antibiotics:  None  HPI/Subjective: No complains wants to be left alone.  Objective: Filed Vitals:   07/31/14 1607 07/31/14 2140 08/01/14 0431 08/01/14 0823  BP: 128/66 127/73 167/81 134/79  Pulse: 62 65 61 65  Temp: 98 F (36.7 C) 98.4 F (36.9 C) 98.1 F (36.7 C)   TempSrc: Oral Oral Oral   Resp: 18 18 18 18   Height:      Weight:   88.814 kg (195 lb 12.8 oz)   SpO2: 99% 98% 97% 95%     Exam:  General: Alert, awake, oriented x3, in no acute distress.  HEENT: No bruits, no goiter. +JVD Heart: Regular rate and rhythm. Lower ext  edema. Lungs: Good air movement, clear Abdomen: Soft, nontender, nondistended, positive bowel sounds.  Neuro: Grossly intact, nonfocal.   Data Reviewed: Basic Metabolic Panel:  Recent Labs Lab 07/30/14 1200 07/31/14 0410 08/01/14 0350  NA 135 137 137  K 4.5 4.2 4.0  CL 107 104 103  CO2 23 23 26   GLUCOSE 120* 121* 109*  BUN 37* 36* 35*  CREATININE 1.55* 1.73* 1.60*  CALCIUM 7.9* 7.7* 7.4*   Liver Function Tests:  Recent Labs Lab 07/30/14 1200  AST 33  ALT 89*  ALKPHOS 87  BILITOT 1.0  PROT 6.1*  ALBUMIN 3.5   No results for input(s): LIPASE, AMYLASE in the last 168 hours. No results for input(s): AMMONIA in the last 168 hours. CBC:  Recent Labs Lab 07/30/14 1200  WBC 8.8  NEUTROABS 7.5  HGB 14.0  HCT 43.2  MCV 93.5  PLT 271   Cardiac Enzymes:  Recent Labs Lab 07/30/14 1200  TROPONINI 0.03   BNP (last 3 results)  Recent Labs  07/30/14 1200  BNP 2824.4*    ProBNP (last 3 results)  Recent Labs  02/08/14 1655 02/13/14 0405  PROBNP 62377.0* 23222.0*    CBG: No results for input(s): GLUCAP in the last 168 hours.  No results found for this or any previous visit (from the past 240 hour(s)).   Studies: Dg Chest 2  View  07/30/2014   CLINICAL DATA:  CHF.  Leg swelling.  EXAM: CHEST  2 VIEW  COMPARISON:  05/10/2014  FINDINGS: Cardiac silhouette remains enlarged. Thoracic aorta is tortuous. There is new pulmonary vascular congestion with some indistinctness of the pulmonary vasculature. Mild interstitial densities are present in the left lower lung. There is new confluent airspace opacity in the right lower lobe. There is fissural thickening, and there are small right larger than left pleural effusions. No pneumothorax or acute osseous abnormality is identified.  IMPRESSION: 1. Cardiomegaly with new pulmonary vascular congestion, small bilateral pleural effusions, and likely early interstitial edema. 2. Right lower lobe airspace opacity could reflect  superimposed pneumonia.   Electronically Signed   By: Logan Bores   On: 07/30/2014 12:17    Scheduled Meds: . aspirin EC  81 mg Oral Daily  . colchicine  0.6 mg Oral BID  . enoxaparin (LOVENOX) injection  40 mg Subcutaneous Q24H  . folic acid  1 mg Oral Daily  . furosemide  40 mg Intravenous Q12H  . gabapentin  100 mg Oral BID  . isosorbide dinitrate  20 mg Oral TID  . LORazepam  0-4 mg Oral 4 times per day   Followed by  . [START ON 08/02/2014] LORazepam  0-4 mg Oral Q12H  . multivitamin with minerals  1 tablet Oral Daily  . pantoprazole  40 mg Oral Daily  . sodium chloride  3 mL Intravenous Q12H  . tamsulosin  0.4 mg Oral QPC supper  . thiamine  100 mg Oral Daily   Or  . thiamine  100 mg Intravenous Daily   Continuous Infusions:    Charlynne Cousins  [facility] Pager [phone]  If 7PM-7AM, please contact night-coverage at [email or web address], password [facility] 08/01/2014, 9:37 AM  LOS: 2 days

## 2014-08-01 NOTE — Clinical Social Work Note (Signed)
CSW attempted to meet with pt but he was sleeping  CSW reviewed chart which reflected an MD progress note dated 5/14 stating that pt should be ready for discharge to SNF in 2 days  CSW called and spoke with pt's daughter Hoyle Sauer to provide bed offers for SNF  CSW provided SNF's that stated they could accommodate pt at discharge  Pt's daughter stated that she would be at hospical tomorrow so CSW provided phone number and agreed to meet her with a list of the facilites that stated they could accommodate pt  CSW will continue to follow pt until discharge  .Dede Query, LCSW [facility] Clinical Social Worker - Weekend Coverage cell #: [phone]

## 2014-08-02 LAB — BASIC METABOLIC PANEL
Anion gap: 11 (ref 5–15)
BUN: 35 mg/dL — AB (ref 6–20)
CHLORIDE: 97 mmol/L — AB (ref 101–111)
CO2: 30 mmol/L (ref 22–32)
CREATININE: 1.56 mg/dL — AB (ref 0.61–1.24)
Calcium: 7.6 mg/dL — ABNORMAL LOW (ref 8.9–10.3)
GFR calc non Af Amer: 38 mL/min — ABNORMAL LOW (ref >60)
GFR, EST AFRICAN AMERICAN: 44 mL/min — AB (ref >60)
Glucose, Bld: 90 mg/dL (ref 65–99)
Potassium: 3.7 mmol/L (ref 3.5–5.1)
Sodium: 138 mmol/L (ref 135–145)

## 2014-08-02 NOTE — Clinical Social Work Note (Signed)
CSW received a call from RN that pt's daughter was in his room and would like the copy of the SNF bed offers.  She also stated that if she was not in room to please leave the copy  CSW went to pt's room and daughter was not there so a copy of the SNF bed offers was left with pt  .Dede Query, LCSW [facility] Clinical Social Worker - Weekend Coverage cell #: [phone]

## 2014-08-02 NOTE — Progress Notes (Signed)
[facility] PROGRESS NOTE Interim History: 81 yo male h/o etoh abuse, noncompliance with meds, chronic chf systolic, htn comes in with a friend for swelling in his legs that have not gotten any better with his home diuretics. Filed Weights   07/31/14 0420 08/01/14 0431 08/02/14 0448  Weight: 94.212 kg (207 lb 11.2 oz) 88.814 kg (195 lb 12.8 oz) 86.229 kg (190 lb 1.6 oz)        Intake/Output Summary (Last 24 hours) at 08/02/14 0831 Last data filed at 08/02/14 0451  Gross per 24 hour  Intake    480 ml  Output   3600 ml  Net  -3120 ml     Assessment/Plan: Acute systolic CHF (congestive heart failure): - he has been noncompliant with his home regimen. - Cont IV lasix and oral BiDil. Good UOP. Apply ted hose. - Will continue monitor electrolytes. Continue strict I's and O's low-sodium diet and restrict his fluids. - weight cont to improve. - SW consult for SNF placement.  Noncompliance with medication regimen: - Consult for psyq and patient does not have capacity.  CKD (chronic kidney disease): - Creatinine seems to be close to baseline.  Alcohol abuse: - Monitor serial protocol continue thiamine and folate.  Essential hypertension - Seems to be controlled continue current regimen.    Code Status: full Family Communication: none  Disposition Plan: SNF to 2 days.   Consultants:  none  Procedures: ECHO: pending  Antibiotics:  None  HPI/Subjective: No complains.  Objective: Filed Vitals:   08/01/14 1405 08/01/14 2240 08/02/14 0447 08/02/14 0448  BP: 129/50 114/56 115/58   Pulse: 59 63 48   Temp: 97.7 F (36.5 C) 98.7 F (37.1 C) 98.5 F (36.9 C)   TempSrc: Oral Oral Oral   Resp: 16 18 18    Height:      Weight:    86.229 kg (190 lb 1.6 oz)  SpO2: 97% 95% 99%      Exam:  General: Alert, awake, oriented x3, in no acute distress.  HEENT: No bruits, no goiter. +JVD Heart: Regular rate and rhythm. Lower ext edema. Lungs: Good air movement,  clear Abdomen: Soft, nontender, nondistended, positive bowel sounds.  Neuro: Grossly intact, nonfocal.   Data Reviewed: Basic Metabolic Panel:  Recent Labs Lab 07/30/14 1200 07/31/14 0410 08/01/14 0350 08/02/14 0558  NA 135 137 137 138  K 4.5 4.2 4.0 3.7  CL 107 104 103 97*  CO2 23 23 26 30   GLUCOSE 120* 121* 109* 90  BUN 37* 36* 35* 35*  CREATININE 1.55* 1.73* 1.60* 1.56*  CALCIUM 7.9* 7.7* 7.4* 7.6*   Liver Function Tests:  Recent Labs Lab 07/30/14 1200  AST 33  ALT 89*  ALKPHOS 87  BILITOT 1.0  PROT 6.1*  ALBUMIN 3.5   No results for input(s): LIPASE, AMYLASE in the last 168 hours. No results for input(s): AMMONIA in the last 168 hours. CBC:  Recent Labs Lab 07/30/14 1200  WBC 8.8  NEUTROABS 7.5  HGB 14.0  HCT 43.2  MCV 93.5  PLT 271   Cardiac Enzymes:  Recent Labs Lab 07/30/14 1200  TROPONINI 0.03   BNP (last 3 results)  Recent Labs  07/30/14 1200  BNP 2824.4*    ProBNP (last 3 results)  Recent Labs  02/08/14 1655 02/13/14 0405  PROBNP 62377.0* 23222.0*    CBG: No results for input(s): GLUCAP in the last 168 hours.  No results found for this or any previous visit (from the past 240 hour(s)).  Studies: No results found.  Scheduled Meds: . aspirin EC  81 mg Oral Daily  . colchicine  0.6 mg Oral BID  . enoxaparin (LOVENOX) injection  40 mg Subcutaneous Q24H  . folic acid  1 mg Oral Daily  . furosemide  40 mg Intravenous Q12H  . gabapentin  100 mg Oral BID  . isosorbide-hydrALAZINE  2 tablet Oral BID  . LORazepam  0-4 mg Oral 4 times per day   Followed by  . LORazepam  0-4 mg Oral Q12H  . multivitamin with minerals  1 tablet Oral Daily  . pantoprazole  40 mg Oral Daily  . sodium chloride  3 mL Intravenous Q12H  . tamsulosin  0.4 mg Oral QPC supper  . thiamine  100 mg Oral Daily   Or  . thiamine  100 mg Intravenous Daily   Continuous Infusions:    Charlynne Cousins  [facility] Pager [phone]  If  7PM-7AM, please contact night-coverage at [email or web address], password [facility] 08/02/2014, 8:31 AM  LOS: 3 days

## 2014-08-02 NOTE — Progress Notes (Signed)
Patient requested for the TED hose to be taken off, reeducated patient but he stated he wants it off because he feels uncomfortable with it. TED hose taken off.

## 2014-08-03 LAB — BASIC METABOLIC PANEL
Anion gap: 12 (ref 5–15)
BUN: 32 mg/dL — AB (ref 6–20)
CHLORIDE: 95 mmol/L — AB (ref 101–111)
CO2: 29 mmol/L (ref 22–32)
Calcium: 7.4 mg/dL — ABNORMAL LOW (ref 8.9–10.3)
Creatinine, Ser: 1.55 mg/dL — ABNORMAL HIGH (ref 0.61–1.24)
GFR calc Af Amer: 45 mL/min — ABNORMAL LOW (ref >60)
GFR calc non Af Amer: 39 mL/min — ABNORMAL LOW (ref >60)
GLUCOSE: 97 mg/dL (ref 65–99)
POTASSIUM: 3.8 mmol/L (ref 3.5–5.1)
Sodium: 136 mmol/L (ref 135–145)

## 2014-08-03 LAB — BRAIN NATRIURETIC PEPTIDE: B NATRIURETIC PEPTIDE 5: 937.4 pg/mL — AB (ref 0.0–100.0)

## 2014-08-03 MED ORDER — FUROSEMIDE 40 MG PO TABS: 40.0000 mg | ORAL_TABLET | Freq: Two times a day (BID) | ORAL | Status: DC

## 2014-08-03 MED ORDER — FUROSEMIDE 10 MG/ML IJ SOLN
40.0000 mg | Freq: Once | INTRAMUSCULAR | Status: AC
  Administered 2014-08-03: 40 mg via INTRAVENOUS
  Filled 2014-08-03: qty 4

## 2014-08-03 MED ORDER — FUROSEMIDE 40 MG PO TABS
40.0000 mg | ORAL_TABLET | Freq: Two times a day (BID) | ORAL | Status: DC
  Administered 2014-08-04 – 2014-08-05 (×3): 40 mg via ORAL
  Filled 2014-08-03 (×3): qty 1

## 2014-08-03 NOTE — Progress Notes (Signed)
Pt had 6bts of pvc's NP on call aware will continue to monitor patient. Pt is asleep and no cp, or sob.

## 2014-08-03 NOTE — Progress Notes (Signed)
[facility] PROGRESS NOTE Interim History: 81 yo male h/o etoh abuse, noncompliance with meds, chronic chf systolic, htn comes in with a friend for swelling in his legs that have not gotten any better with his home diuretics. Filed Weights   08/01/14 0431 08/02/14 0448 08/03/14 0645  Weight: 88.814 kg (195 lb 12.8 oz) 86.229 kg (190 lb 1.6 oz) 83.734 kg (184 lb 9.6 oz)        Intake/Output Summary (Last 24 hours) at 08/03/14 6060 Last data filed at 08/03/14 0646  Gross per 24 hour  Intake    560 ml  Output   5100 ml  Net  -4540 ml     Assessment/Plan: Acute systolic CHF (congestive heart failure): - he has been noncompliant with his home regimen. - Cont  oral BiDil. b-met pending will add low dose ACE-I, change lasix to oral.Good UOP. Apply ted hose. - Will continue monitor electrolytes. Continue strict I's and O's low-sodium diet and restrict his fluids. - weight cont to improve. - SW consult for SNF placement.  Noncompliance with medication regimen: - Consult for psyq and patient does not have capacity. - d/w family.  CKD (chronic kidney disease): - Creatinine seems to be close to baseline.  Alcohol abuse: - Monitor serial protocol continue thiamine and folate.  Essential hypertension - Seems to be controlled continue current regimen.    Code Status: full Family Communication: none  Disposition Plan: SNF to 2 days.   Consultants:  none  Procedures: ECHO: pending  Antibiotics:  None  HPI/Subjective: No complains.  Objective: Filed Vitals:   08/02/14 1409 08/02/14 1714 08/02/14 2201 08/03/14 0645  BP: 113/50 113/43 123/55 121/55  Pulse: 70 70 63 58  Temp: 97.4 F (36.3 C) 98.5 F (36.9 C) 98.2 F (36.8 C) 97.6 F (36.4 C)  TempSrc: Oral Oral Oral Oral  Resp: $Remo'18 18 18 20  'Fbgkt$ Height:      Weight:    83.734 kg (184 lb 9.6 oz)  SpO2: 95% 92% 96% 97%     Exam:  General: Alert, awake, oriented x3, in no acute distress.  HEENT: No  bruits, no goiter. +JVD Heart: Regular rate and rhythm. No lower ext edema. Lungs: Good air movement, clear Abdomen: Soft, nontender, nondistended, positive bowel sounds.  Neuro: Grossly intact, nonfocal.   Data Reviewed: Basic Metabolic Panel:  Recent Labs Lab 07/30/14 1200 07/31/14 0410 08/01/14 0350 08/02/14 0558  NA 135 137 137 138  K 4.5 4.2 4.0 3.7  CL 107 104 103 97*  CO2 $Re'23 23 26 30  'RSz$ GLUCOSE 120* 121* 109* 90  BUN 37* 36* 35* 35*  CREATININE 1.55* 1.73* 1.60* 1.56*  CALCIUM 7.9* 7.7* 7.4* 7.6*   Liver Function Tests:  Recent Labs Lab 07/30/14 1200  AST 33  ALT 89*  ALKPHOS 87  BILITOT 1.0  PROT 6.1*  ALBUMIN 3.5   No results for input(s): LIPASE, AMYLASE in the last 168 hours. No results for input(s): AMMONIA in the last 168 hours. CBC:  Recent Labs Lab 07/30/14 1200  WBC 8.8  NEUTROABS 7.5  HGB 14.0  HCT 43.2  MCV 93.5  PLT 271   Cardiac Enzymes:  Recent Labs Lab 07/30/14 1200  TROPONINI 0.03   BNP (last 3 results)  Recent Labs  07/30/14 1200 08/03/14 0438  BNP 2824.4* 937.4*    ProBNP (last 3 results)  Recent Labs  02/08/14 1655 02/13/14 0405  PROBNP 62377.0* 23222.0*    CBG: No results for input(s): GLUCAP in the  last 168 hours.  No results found for this or any previous visit (from the past 240 hour(s)).   Studies: No results found.  Scheduled Meds: . aspirin EC  81 mg Oral Daily  . colchicine  0.6 mg Oral BID  . enoxaparin (LOVENOX) injection  40 mg Subcutaneous Q24H  . folic acid  1 mg Oral Daily  . furosemide  40 mg Intravenous Q12H  . gabapentin  100 mg Oral BID  . isosorbide-hydrALAZINE  2 tablet Oral BID  . LORazepam  0-4 mg Oral Q12H  . multivitamin with minerals  1 tablet Oral Daily  . pantoprazole  40 mg Oral Daily  . sodium chloride  3 mL Intravenous Q12H  . tamsulosin  0.4 mg Oral QPC supper  . thiamine  100 mg Oral Daily   Or  . thiamine  100 mg Intravenous Daily   Continuous Infusions:     Charlynne Cousins  [facility] Pager [phone]  If 7PM-7AM, please contact night-coverage at [email or web address], password [facility] 08/03/2014, 8:32 AM  LOS: 4 days

## 2014-08-03 NOTE — Progress Notes (Signed)
Physical Therapy Treatment Patient Details Name: Jesse Allen MRN: [id number] DOB: April 04, 1925 Today's Date: 08/03/2014    History of Present Illness 81 year old male h/o etoh abuse, noncompliance with meds, chronic CHF systolic, HTN, arthritis, gout admitted for swelling in his legs, diagnosed with acute systolic CHF.    PT Comments    Progressing slowly,  cooperative but  Limited by LE pain in [place]; will follow, continue to recommend SNF  Follow Up Recommendations  SNF;Supervision/Assistance - 24 hour     Equipment Recommendations  None recommended by PT    Recommendations for Other Services       Precautions / Restrictions Precautions Precautions: Fall Restrictions Weight Bearing Restrictions: No    Mobility  Bed Mobility Overal bed mobility: Needs Assistance Bed Mobility: Supine to Sit     Supine to sit: Min assist;HOB elevated     General bed mobility comments: facilitation of LE movement, requires cues for task completion and incr time  Transfers Overall transfer level: Needs assistance Equipment used: Rolling walker (2 wheeled) Transfers: Sit to/from [facility] Sit to Stand: Min assist;From elevated surface Stand pivot transfers: Min assist       General transfer comment: verbal cues for safety, assist to steady, pt only agreeable to pivot to recliner, declined ambulation due to pain; took ~ 6 small pivotal steps   Ambulation/Gait                 Stairs            Wheelchair Mobility    Modified Rankin (Stroke Patients Only)       Balance Overall balance assessment: Needs assistance         Standing balance support: During functional activity;Bilateral upper extremity supported Standing balance-Leahy Scale: Poor Standing balance comment: requires min assist and RW support for transfers                    Cognition Arousal/Alertness: Awake/alert Behavior During Therapy: WFL for tasks  assessed/performed Overall Cognitive Status: Within Functional Limits for tasks assessed       Memory: Decreased short-term memory         General Comments: pt reports his legs were never swollen and he does not recall being OOB in chair    Exercises General Exercises - Lower Extremity Ankle Circles/Pumps: AROM;Both;10 reps Quad Sets: AROM;Both;5 reps    General Comments        Pertinent Vitals/Pain Pain Assessment: Faces Faces Pain Scale: Hurts whole lot Pain Location: bil LEs, pain increases with WBing Pain Descriptors / Indicators: Heaviness;Other (Comment) (biting and dead) Pain Intervention(s): Limited activity within patient's tolerance;Monitored during session;Repositioned    Home Living                      Prior Function            PT Goals (current goals can now be found in the care plan section) Acute Rehab PT Goals Patient Stated Goal: none stated PT Goal Formulation: With patient Time For Goal Achievement: 08/14/14 Potential to Achieve Goals: Good Progress towards PT goals: Progressing toward goals    Frequency  Min 3X/week    PT Plan Current plan remains appropriate    Co-evaluation             End of Session Equipment Utilized During Treatment: Gait belt Activity Tolerance: Patient tolerated treatment well;Patient limited by pain Patient left: in chair;with call bell/phone within reach;with chair  alarm set     Time: 9381-0175 PT Time Calculation (min) (ACUTE ONLY): 19 min  Charges:  $Therapeutic Activity: 8-22 mins                    G Codes:      Heath Badon 22-Aug-2014, 11:30 AM

## 2014-08-03 NOTE — Progress Notes (Signed)
Pharmacist Heart Failure Core Measure Documentation  Assessment: Jesse Allen has an EF documented as 25-30% on 07/31/14 by ECHO.  Rationale: Heart failure patients with left ventricular systolic dysfunction (LVSD) and an EF < 40% should be prescribed an angiotensin converting enzyme inhibitor (ACEI) or angiotensin receptor blocker (ARB) at discharge unless a contraindication is documented in the medical record.  This patient is not currently on an ACEI or ARB for HF.  This note is being placed in the record in order to provide documentation that a contraindication to the use of these agents is present for this encounter.  ACE Inhibitor or Angiotensin Receptor Blocker is contraindicated (specify all that apply)  []   ACEI allergy AND ARB allergy []   Angioedema []   Moderate or severe aortic stenosis []   Hyperkalemia []   Hypotension []   Renal artery stenosis [x]   Worsening renal function, preexisting renal disease or dysfunction   [name] 08/03/2014 7:33 AM

## 2014-08-03 NOTE — Clinical Social Work Psych Assess (Signed)
Clinical Social Work Nature conservation officer  Clinical Social Worker:  Clyde Lundborg Date/Time:  08/03/2014, 4:59 PM Referred By:  Physician Date Referred:  07/31/14 Reason for Referral:  Competency/Guardianship   Presenting Symptoms/Problems  Presenting Symptoms/Problems(in person's/family's own words):  Pt referred to psychiatry to determine capacity to make medical decisions   Abuse/Neglect/Trauma History  Abuse/Neglect/Trauma History:  Denies History Abuse/Neglect/Trauma History Comments (indicate dates):  N/A   Psychiatric History  Psychiatric History:  Denies History Psychiatric Medication: None currently prescribed   Current Mental Health Hospitalizations/Previous Mental Health History:  Pt denies current mental health issues or previous history. States that his "ups and downs" have been health related.   Current Provider:  No psychiatric provider Place and Date:  N/A  Current Medications:   Scheduled Meds: . aspirin EC  81 mg Oral Daily  . colchicine  0.6 mg Oral BID  . enoxaparin (LOVENOX) injection  40 mg Subcutaneous Q24H  . folic acid  1 mg Oral Daily  . [START ON 08/04/2014] furosemide  40 mg Oral BID  . gabapentin  100 mg Oral BID  . isosorbide-hydrALAZINE  2 tablet Oral BID  . LORazepam  0-4 mg Oral Q12H  . multivitamin with minerals  1 tablet Oral Daily  . pantoprazole  40 mg Oral Daily  . sodium chloride  3 mL Intravenous Q12H  . tamsulosin  0.4 mg Oral QPC supper  . thiamine  100 mg Oral Daily   Or  . thiamine  100 mg Intravenous Daily   Continuous Infusions:  PRN Meds:.sodium chloride, acetaminophen, ondansetron (ZOFRAN) IV, oxyCODONE-acetaminophen, sodium chloride   Previous Inpatient Admission/Date/Reason:  Pt denies   Emotional Health/Current Symptoms  Suicide/Self Harm: None Reported Suicide Attempt in Past (date/description):  Pt denies   Other Harmful Behavior (ex. homicidal ideation) (describe):  Pt  denies   Psychotic/Dissociative Symptoms  Psychotic/Dissociative Symptoms: None Reported Other Psychotic/Dissociative Symptoms:  N/A   Attention/Behavioral Symptoms  Attention/Behavioral Symptoms: Within Normal Limits Other Attention/Behavioral Symptoms:  N/A   Cognitive Impairment  Cognitive Impairment:  Poor/Impaired Decision-Making, Poor Judgement (Pt was noted to have delayed processing speeds but cognition and memory intact) Other Cognitive Impairment:  See comment above   Mood and Adjustment  Mood and Adjustment:  Mood Congruent   Stress, Anxiety, Trauma, Any Recent Loss/Stressor  Stress, Anxiety, Trauma, Any Recent Loss/Stressor: None Reported Anxiety (frequency):  N/A  Phobia (specify):  N/A  Compulsive Behavior (specify):  N/A  Obsessive Behavior (specify):  N/A  Other Stress, Anxiety, Trauma, Any Recent Loss/Stressor:  Pt denies having any stressors other than his current medical issues   Substance Abuse/Use  Substance Abuse/Use: None SBIRT Completed (please refer for detailed history): N/A Self-reported Substance Use (last use and frequency):  Pt denies  Urinary Drug Screen Completed: Yes Alcohol Level:  <.05   Environment/Housing/Living Arrangement  Environmental/Housing/Living Arrangement: Stable Housing Who is in the Home:  Pt reports living alone  Emergency Contact:  Hoyle Sauer, daughter   Financial  Financial: Medicare   Patient's Strengths and Goals  Patient's Strengths and Goals (patient's own words):  Pt has positive outlook on medical issues and reports having many supports in the community.   Clinical Social Worker's Interpretive Summary  Clinical Social Workers Interpretive Summary:  CSW met with Pt due to psych referral regarding Pt competency. Per psych MD, Pt lacks capacity to make medical decisions. Pt cognition and memory intact when CSW met with him, however Pt has delayed processing speeds and at times his answers  to  questions were off topic. Pt was redirectable and pleasant. He spoke about his time in the [employer] during the [place] war and his employment afterwards. Pt reports that his knees are his main concern at this time as he is unsure of the etiology. He was unable to identify what was the reason he had been hospitalized, stating, "It's mainly just a problem with myself, my health." Pt reports having many family supports in the area.    Disposition  Disposition: Psych Clinical Social Worker Signing Off; no further resources or assistance needed.

## 2014-08-03 NOTE — Care Management Note (Signed)
Case Management Note  Patient Details  Name: Jesse Allen MRN: [id number] Date of Birth: [birth year]  Subjective/Objective:                    Action/Plan:Psych-No capacity for informed consent.d/c plan SNF.   Expected Discharge Date:   (unknown)               Expected Discharge Plan:  [place]  In-House Referral:   (Per ED CM note-APS referral-not safe @ home.)  Discharge planning Services  CM Consult  Post Acute Care Choice:    Choice offered to:     DME Arranged:    DME Agency:     HH Arranged:    HH Agency:     Status of Service:  In process, will continue to follow  Medicare Important Message Given:  Yes Date Medicare IM Given:  08/03/14 Medicare IM give by:  Dessa Phi Date Additional Medicare IM Given:    Additional Medicare Important Message give by:     If discussed at [place] of Stay Meetings, dates discussed:    Additional Comments:  Dessa Phi, RN 08/03/2014, 4:59 PM

## 2014-08-04 LAB — BASIC METABOLIC PANEL
Anion gap: 10 (ref 5–15)
BUN: 33 mg/dL — ABNORMAL HIGH (ref 6–20)
CO2: 29 mmol/L (ref 22–32)
Calcium: 7.3 mg/dL — ABNORMAL LOW (ref 8.9–10.3)
Chloride: 96 mmol/L — ABNORMAL LOW (ref 101–111)
Creatinine, Ser: 1.3 mg/dL — ABNORMAL HIGH (ref 0.61–1.24)
GFR calc Af Amer: 55 mL/min — ABNORMAL LOW (ref >60)
GFR calc non Af Amer: 48 mL/min — ABNORMAL LOW (ref >60)
Glucose, Bld: 94 mg/dL (ref 65–99)
POTASSIUM: 3.4 mmol/L — AB (ref 3.5–5.1)
SODIUM: 135 mmol/L (ref 135–145)

## 2014-08-04 MED ORDER — FUROSEMIDE 10 MG/ML IJ SOLN
40.0000 mg | Freq: Two times a day (BID) | INTRAMUSCULAR | Status: DC
  Administered 2014-08-04: 40 mg via INTRAVENOUS
  Filled 2014-08-04: qty 4

## 2014-08-04 MED ORDER — CALCIUM CARBONATE-VITAMIN D 500-200 MG-UNIT PO TABS
1.0000 | ORAL_TABLET | Freq: Three times a day (TID) | ORAL | Status: DC
  Administered 2014-08-04 – 2014-08-05 (×3): 1 via ORAL
  Filled 2014-08-04 (×4): qty 1

## 2014-08-04 MED ORDER — CARVEDILOL 3.125 MG PO TABS
3.1250 mg | ORAL_TABLET | Freq: Two times a day (BID) | ORAL | Status: DC
  Administered 2014-08-04 – 2014-08-05 (×2): 3.125 mg via ORAL
  Filled 2014-08-04 (×2): qty 1

## 2014-08-04 MED ORDER — ISOSORB DINITRATE-HYDRALAZINE 20-37.5 MG PO TABS: 1.0000 | ORAL_TABLET | Freq: Two times a day (BID) | ORAL | Status: DC

## 2014-08-04 MED ORDER — POTASSIUM CHLORIDE CRYS ER 20 MEQ PO TBCR
40.0000 meq | EXTENDED_RELEASE_TABLET | Freq: Two times a day (BID) | ORAL | Status: AC
  Administered 2014-08-04 (×2): 40 meq via ORAL
  Filled 2014-08-04 (×2): qty 2

## 2014-08-04 MED ORDER — LISINOPRIL 2.5 MG PO TABS
2.5000 mg | ORAL_TABLET | Freq: Every day | ORAL | Status: DC
  Administered 2014-08-04 – 2014-08-05 (×2): 2.5 mg via ORAL
  Filled 2014-08-04 (×2): qty 1

## 2014-08-04 NOTE — Progress Notes (Signed)
[facility] PROGRESS NOTE Interim History: 80 yo male h/o etoh abuse, noncompliance with meds, chronic chf systolic, htn comes in with a friend for swelling in his legs that have not gotten any better with his home diuretics. Filed Weights   08/02/14 0448 08/03/14 0645 08/04/14 0516  Weight: 86.229 kg (190 lb 1.6 oz) 83.734 kg (184 lb 9.6 oz) 82.6 kg (182 lb 1.6 oz)        Intake/Output Summary (Last 24 hours) at 08/04/14 1041 Last data filed at 08/04/14 0700  Gross per 24 hour  Intake    600 ml  Output   2650 ml  Net  -2050 ml     Assessment/Plan: Acute systolic CHF (congestive heart failure): - he has been noncompliant with his home regimen. - start low dose coreg. - Cont  oral BiDil and ACE-I, cr. Change lasix to orals. Good UOP overnight, he cont to improves. - Will continue monitor electrolytes. Continue strict I's and O's low-sodium diet and restrict his fluids. - weight cont to improve. - SW consult for SNF placement.  Noncompliance with medication regimen: - Consult for psyq and patient does not have capacity. - d/w family they like patient to go to SNF.  CKD (chronic kidney disease) stage III: - His cr has improved significantly. - cont to monitor.  Alcohol abuse: - Monitor serial protocol continue thiamine and folate.  Essential hypertension - Seems to be controlled continue current regimen.    Code Status: full Family Communication: none  Disposition Plan: SNF to 2 days.   Consultants:  none  Procedures: ECHO: pending  Antibiotics:  None  HPI/Subjective: No complains.  Objective: Filed Vitals:   08/03/14 0946 08/03/14 1500 08/03/14 2219 08/04/14 0516  BP: 130/51 121/49 118/45 112/52  Pulse:  71 64 68  Temp:  97.6 F (36.4 C) 98.6 F (37 C) 98.5 F (36.9 C)  TempSrc:  Oral Oral Oral  Resp:  18 18 18   Height:      Weight:    82.6 kg (182 lb 1.6 oz)  SpO2:  99% 97% 94%     Exam:  General: Alert, awake, oriented x3, in  no acute distress.  HEENT: No bruits, no goiter. +JVD Heart: Regular rate and rhythm. No lower ext edema. Lungs: Good air movement, clear Abdomen: Soft, nontender, nondistended, positive bowel sounds.  Neuro: Grossly intact, nonfocal.   Data Reviewed: Basic Metabolic Panel:  Recent Labs Lab 07/31/14 0410 08/01/14 0350 08/02/14 0558 08/03/14 0438 08/04/14 0945  NA 137 137 138 136 135  K 4.2 4.0 3.7 3.8 3.4*  CL 104 103 97* 95* 96*  CO2 23 26 30 29 29   GLUCOSE 121* 109* 90 97 94  BUN 36* 35* 35* 32* 33*  CREATININE 1.73* 1.60* 1.56* 1.55* 1.30*  CALCIUM 7.7* 7.4* 7.6* 7.4* 7.3*   Liver Function Tests:  Recent Labs Lab 07/30/14 1200  AST 33  ALT 89*  ALKPHOS 87  BILITOT 1.0  PROT 6.1*  ALBUMIN 3.5   No results for input(s): LIPASE, AMYLASE in the last 168 hours. No results for input(s): AMMONIA in the last 168 hours. CBC:  Recent Labs Lab 07/30/14 1200  WBC 8.8  NEUTROABS 7.5  HGB 14.0  HCT 43.2  MCV 93.5  PLT 271   Cardiac Enzymes:  Recent Labs Lab 07/30/14 1200  TROPONINI 0.03   BNP (last 3 results)  Recent Labs  07/30/14 1200 08/03/14 0438  BNP 2824.4* 937.4*    ProBNP (last 3 results)  Recent Labs  02/08/14 1655 02/13/14 0405  PROBNP 62377.0* 23222.0*    CBG: No results for input(s): GLUCAP in the last 168 hours.  No results found for this or any previous visit (from the past 240 hour(s)).   Studies: No results found.  Scheduled Meds: . aspirin EC  81 mg Oral Daily  . colchicine  0.6 mg Oral BID  . enoxaparin (LOVENOX) injection  40 mg Subcutaneous Q24H  . folic acid  1 mg Oral Daily  . furosemide  40 mg Oral BID  . gabapentin  100 mg Oral BID  . isosorbide-hydrALAZINE  2 tablet Oral BID  . LORazepam  0-4 mg Oral Q12H  . multivitamin with minerals  1 tablet Oral Daily  . pantoprazole  40 mg Oral Daily  . potassium chloride  40 mEq Oral BID  . sodium chloride  3 mL Intravenous Q12H  . tamsulosin  0.4 mg Oral QPC supper   . thiamine  100 mg Oral Daily   Or  . thiamine  100 mg Intravenous Daily   Continuous Infusions:    Charlynne Cousins  [facility] Pager [phone]  If 7PM-7AM, please contact night-coverage at [email or web address], password [facility] 08/04/2014, 10:41 AM  LOS: 5 days

## 2014-08-04 NOTE — Care Management Note (Signed)
Case Management Note  Patient Details  Name: Jesse Allen MRN: [id number] Date of Birth: 1925/12/14  Subjective/Objective:                    Action/Plan:   Expected Discharge Date:   (unknown)               Expected Discharge Plan:  [name]  In-House Referral:   (Per ED CM note-APS referral-not safe @ home.)  Discharge planning Services  CM Consult  Post Acute Care Choice:    Choice offered to:     DME Arranged:    DME Agency:     HH Arranged:    HH Agency:     Status of Service:  In process, will continue to follow  Medicare Important Message Given:  Yes Date Medicare IM Given:  08/03/14 Medicare IM give by:  Dessa Phi Date Additional Medicare IM Given:    Additional Medicare Important Message give by:     If discussed at [place] of Stay Meetings, dates discussed:  08/04/14  Additional Comments:  Dessa Phi, RN 08/04/2014, 3:06 PM

## 2014-08-04 NOTE — Progress Notes (Signed)
[facility] PROGRESS NOTE Interim History: 81 yo male h/o etoh abuse, noncompliance with meds, chronic chf systolic, htn comes in with a friend for swelling in his legs that have not gotten any better with his home diuretics. Filed Weights   08/02/14 0448 08/03/14 0645 08/04/14 0516  Weight: 86.229 kg (190 lb 1.6 oz) 83.734 kg (184 lb 9.6 oz) 82.6 kg (182 lb 1.6 oz)        Intake/Output Summary (Last 24 hours) at 08/04/14 1036 Last data filed at 08/04/14 0700  Gross per 24 hour  Intake    600 ml  Output   2650 ml  Net  -2050 ml     Assessment/Plan: Acute systolic CHF (congestive heart failure): - he has been noncompliant with his home regimen. - Cont  oral BiDil. Cont  ACE-I, cr. Change lasix to orals. Good UOP. - Will continue monitor electrolytes. Continue strict I's and O's low-sodium diet and restrict his fluids. - weight cont to improve. - SW consult for SNF placement.  Noncompliance with medication regimen: - Consult for psyq and patient does not have capacity. - d/w family they like patient to go to SNF.  CKD (chronic kidney disease): - Creatinine seems to be close to baseline.  Alcohol abuse: - Monitor serial protocol continue thiamine and folate.  Essential hypertension - Seems to be controlled continue current regimen.    Code Status: full Family Communication: none  Disposition Plan: SNF to 2 days.   Consultants:  none  Procedures: ECHO: pending  Antibiotics:  None  HPI/Subjective: No complains.  Objective: Filed Vitals:   08/03/14 0946 08/03/14 1500 08/03/14 2219 08/04/14 0516  BP: 130/51 121/49 118/45 112/52  Pulse:  71 64 68  Temp:  97.6 F (36.4 C) 98.6 F (37 C) 98.5 F (36.9 C)  TempSrc:  Oral Oral Oral  Resp:  18 18 18   Height:      Weight:    82.6 kg (182 lb 1.6 oz)  SpO2:  99% 97% 94%     Exam:  General: Alert, awake, oriented x3, in no acute distress.  HEENT: No bruits, no goiter. +JVD Heart: Regular rate  and rhythm. No lower ext edema. Lungs: Good air movement, clear Abdomen: Soft, nontender, nondistended, positive bowel sounds.  Neuro: Grossly intact, nonfocal.   Data Reviewed: Basic Metabolic Panel:  Recent Labs Lab 07/31/14 0410 08/01/14 0350 08/02/14 0558 08/03/14 0438 08/04/14 0945  NA 137 137 138 136 135  K 4.2 4.0 3.7 3.8 3.4*  CL 104 103 97* 95* 96*  CO2 23 26 30 29 29   GLUCOSE 121* 109* 90 97 94  BUN 36* 35* 35* 32* 33*  CREATININE 1.73* 1.60* 1.56* 1.55* 1.30*  CALCIUM 7.7* 7.4* 7.6* 7.4* 7.3*   Liver Function Tests:  Recent Labs Lab 07/30/14 1200  AST 33  ALT 89*  ALKPHOS 87  BILITOT 1.0  PROT 6.1*  ALBUMIN 3.5   No results for input(s): LIPASE, AMYLASE in the last 168 hours. No results for input(s): AMMONIA in the last 168 hours. CBC:  Recent Labs Lab 07/30/14 1200  WBC 8.8  NEUTROABS 7.5  HGB 14.0  HCT 43.2  MCV 93.5  PLT 271   Cardiac Enzymes:  Recent Labs Lab 07/30/14 1200  TROPONINI 0.03   BNP (last 3 results)  Recent Labs  07/30/14 1200 08/03/14 0438  BNP 2824.4* 937.4*    ProBNP (last 3 results)  Recent Labs  02/08/14 1655 02/13/14 0405  PROBNP 62377.0* 23222.0*  CBG: No results for input(s): GLUCAP in the last 168 hours.  No results found for this or any previous visit (from the past 240 hour(s)).   Studies: No results found.  Scheduled Meds: . aspirin EC  81 mg Oral Daily  . colchicine  0.6 mg Oral BID  . enoxaparin (LOVENOX) injection  40 mg Subcutaneous Q24H  . folic acid  1 mg Oral Daily  . furosemide  40 mg Intravenous Q12H  . furosemide  40 mg Oral BID  . gabapentin  100 mg Oral BID  . isosorbide-hydrALAZINE  2 tablet Oral BID  . LORazepam  0-4 mg Oral Q12H  . multivitamin with minerals  1 tablet Oral Daily  . pantoprazole  40 mg Oral Daily  . sodium chloride  3 mL Intravenous Q12H  . tamsulosin  0.4 mg Oral QPC supper  . thiamine  100 mg Oral Daily   Or  . thiamine  100 mg Intravenous Daily    Continuous Infusions:    Charlynne Cousins  [facility] Pager [phone]  If 7PM-7AM, please contact night-coverage at [email or web address], password [facility] 08/04/2014, 10:36 AM  LOS: 5 days

## 2014-08-05 LAB — BASIC METABOLIC PANEL
Anion gap: 12 (ref 5–15)
BUN: 34 mg/dL — ABNORMAL HIGH (ref 6–20)
CALCIUM: 7.6 mg/dL — AB (ref 8.9–10.3)
CO2: 28 mmol/L (ref 22–32)
Chloride: 97 mmol/L — ABNORMAL LOW (ref 101–111)
Creatinine, Ser: 1.52 mg/dL — ABNORMAL HIGH (ref 0.61–1.24)
GFR calc Af Amer: 46 mL/min — ABNORMAL LOW (ref >60)
GFR, EST NON AFRICAN AMERICAN: 39 mL/min — AB (ref >60)
GLUCOSE: 91 mg/dL (ref 65–99)
Potassium: 3.9 mmol/L (ref 3.5–5.1)
SODIUM: 137 mmol/L (ref 135–145)

## 2014-08-05 MED ORDER — FUROSEMIDE 40 MG PO TABS: 40.0000 mg | ORAL_TABLET | Freq: Two times a day (BID) | ORAL | Status: DC

## 2014-08-05 MED ORDER — CARVEDILOL 3.125 MG PO TABS: 3.1250 mg | ORAL_TABLET | Freq: Two times a day (BID) | ORAL | Status: DC

## 2014-08-05 MED ORDER — TRAMADOL HCL 50 MG PO TABS: 50.0000 mg | ORAL_TABLET | Freq: Two times a day (BID) | ORAL | Status: DC

## 2014-08-05 MED ORDER — ISOSORB DINITRATE-HYDRALAZINE 20-37.5 MG PO TABS: 1.0000 | ORAL_TABLET | Freq: Two times a day (BID) | ORAL | Status: DC

## 2014-08-05 NOTE — Discharge Summary (Signed)
, Physician Discharge Summary  Jesse Allen WOE:[id number] DOB: 11/25/1925 DOA: 07/30/2014  PCP: Gildardo Cranker, DO  Admit date: 07/30/2014 Discharge date: 08/05/2014  Time spent: 35 minutes  Recommendations for Outpatient Follow-up:  1. Discharged to skilled nursing facility. Please monitor daily weights, intake and output. 2. Cardiology office will arrange for outpatient follow-up. 3. Patient lacks decision-making capacity.   Discharge Diagnoses:  Principal Problem:   Acute on chronic systolic CHF (congestive heart failure)  Active Problems:   Essential hypertension   Arthritis involving multiple sites   Alcohol abuse   Altered mental status   CKD (chronic kidney disease)   Noncompliance with medication regimen   Adjustment disorder with mixed anxiety and depressed mood   Discharge Condition: Fair  Diet recommendation: Heart healthy  Code Status: Full code  Discharge body weight: 183 Lb  Filed Weights   08/03/14 0645 08/04/14 0516 08/05/14 0426  Weight: 83.734 kg (184 lb 9.6 oz) 82.6 kg (182 lb 1.6 oz) 83.099 kg (183 lb 3.2 oz)    History of present illness:  Please refer to admission H&P for details, in brief, 81 year old male with history of alcohol abuse, chronic systolic CHF (with last EF of 35%), noncompliance with medications, hypertension, gout who was admitted on 07/30/2014 for bilateral leg swellings unimproved with Lasix at home. Patient was found to be in acute CHF and admitted to telemetry for IV diuretics. 2-D echo repeated this admission showing worsened EF of 25%. Patient diuresed well and clinically stable to be discharged to skilled nursing facility (as per PT evaluation)  Hospital Course:  Acute on chronic systolic CHF Possibly related to medication noncompliance. Good diuresis with IV Lasix and neck negative balance of about 15 L since admission. Currently euvolemic. -Continue baby aspirin. Will discharge him on Lasix 40 mg twice a day. Needs close  monitoring of his daily weights and intake and output as outpatient. Added carvedilol (was on it previously but did not see on his admission medication list), on hydralazine and Imdur at home (patient not compliant to isosorbide). Will discharge him on BiDil. Not on ACE inhibitor due to chronic kidney disease. -I have ask for cardiology office to arrange for outpatient follow-up. Once blood pressure remains stable he will also need Aldactone.  Medication noncompliance Psychiatry consulted and deemed not to have capacity. Seen by physical therapy and recommended for skilled nursing facility. Family in agreement.  Chronic kidney disease stage III Appears to be around his baseline.  Alcohol abuse No signs of withdrawal while in the hospital. Was placed on thiamine and folate here.   Skin blistering and splitting over bilateral tibia Seen by wound care and recommend to  "Apply Mepitel silicone contact layer to wound bed. Cover with 4x4 gauze and kerlix. Secure with tape. Change every Tuesday and Friday and PRN soilage or strikethrough. "   Essential hypertension Currently stable (at low normal).  History of gout Continue allopurinol and colchicine. On tramadol twice a day for pain  Peripheral neuropathy Continue Neurontin  GERD Continue PPI     Procedures:  2-D echo: Severely reduced EF of 25-30% with grade 2 diastolic dysfunction. Pulmonary artery peak pressure of 62 mmHg.  Consultations:  None  Family communication: None at bedside  Disposition: Skilled nursing facility  Discharge Exam: Filed Vitals:   08/05/14 0751  BP: 109/44  Pulse:   Temp:   Resp:     General: Elderly male lying in bed in no acute distress HEENT: No pallor, no JVD, or dentition,  moist oral mucosa, supple neck Chest: Clear to auscultation bilaterally, no added sounds CVS: Normal S1 and S2, no murmurs rub or gallop GI: Soft, nondistended, nontender, bowel sounds present Musculoskeletal:  Warm, skin breakdown over bilateral tibia with dressing applied, no edema VFI:[id number], non focal   Discharge Instructions    Current Discharge Medication List    START taking these medications   Details  carvedilol (COREG) 3.125 MG tablet Take 1 tablet (3.125 mg total) by mouth 2 (two) times daily with a meal. Qty: 60 tablet, Refills: 0    isosorbide-hydrALAZINE (BIDIL) 20-37.5 MG per tablet Take 1 tablet by mouth 2 (two) times daily. Qty: 60 tablet, Refills: 0      CONTINUE these medications which have CHANGED   Details  furosemide (LASIX) 40 MG tablet Take 1 tablet (40 mg total) by mouth 2 (two) times daily. Qty: 60 tablet, Refills: 0   Associated Diagnoses: Chronic systolic heart failure    traMADol (ULTRAM) 50 MG tablet Take 1 tablet (50 mg total) by mouth 2 (two) times daily. Qty: 30 tablet, Refills: 0   Associated Diagnoses: Arthritis involving multiple sites      CONTINUE these medications which have NOT CHANGED   Details  gabapentin (NEURONTIN) 100 MG capsule Take 1 capsule (100 mg total) by mouth 2 (two) times daily. Qty: 60 capsule, Refills: 1   Associated Diagnoses: Neuropathy    pantoprazole (PROTONIX) 40 MG tablet Take 1 tablet (40 mg total) by mouth daily at 12 noon. Qty: 30 tablet, Refills: 1    tamsulosin (FLOMAX) 0.4 MG CAPS capsule Take 1 capsule (0.4 mg total) by mouth daily after supper. Qty: 30 capsule, Refills: 1    aspirin EC 81 MG EC tablet Take 1 tablet (81 mg total) by mouth daily.    colchicine 0.6 MG tablet Take 1 tablet (0.6 mg total) by mouth 2 (two) times daily.    folic acid (FOLVITE) 1 MG tablet Take 1 tablet (1 mg total) by mouth daily.    Multiple Vitamin (MULTIVITAMIN WITH MINERALS) TABS tablet Take 1 tablet by mouth daily.      STOP taking these medications     ibuprofen (ADVIL,MOTRIN) 200 MG tablet      hydrALAZINE (APRESOLINE) 25 MG tablet      isosorbide dinitrate (ISORDIL) 20 MG tablet        No Known  Allergies Follow-up Information    Follow up with Dorothy Spark, MD On 08/28/2014.   Specialty:  Cardiology   Why:  @ 11:15am   Contact information:   [place] [address] [phone]        The results of significant diagnostics from this hospitalization (including imaging, microbiology, ancillary and laboratory) are listed below for reference.    Significant Diagnostic Studies: Dg Chest 2 View  07/30/2014   CLINICAL DATA:  CHF.  Leg swelling.  EXAM: CHEST  2 VIEW  COMPARISON:  05/10/2014  FINDINGS: Cardiac silhouette remains enlarged. Thoracic aorta is tortuous. There is new pulmonary vascular congestion with some indistinctness of the pulmonary vasculature. Mild interstitial densities are present in the left lower lung. There is new confluent airspace opacity in the right lower lobe. There is fissural thickening, and there are small right larger than left pleural effusions. No pneumothorax or acute osseous abnormality is identified.  IMPRESSION: 1. Cardiomegaly with new pulmonary vascular congestion, small bilateral pleural effusions, and likely early interstitial edema. 2. Right lower lobe airspace opacity could reflect superimposed pneumonia.  Electronically Signed   By: Logan Bores   On: 07/30/2014 12:17    Microbiology: No results found for this or any previous visit (from the past 240 hour(s)).   Labs: Basic Metabolic Panel:  Recent Labs Lab 08/01/14 0350 08/02/14 0558 08/03/14 0438 08/04/14 0945 08/05/14 0440  NA 137 138 136 135 137  K 4.0 3.7 3.8 3.4* 3.9  CL 103 97* 95* 96* 97*  CO2 26 30 29 29 28   GLUCOSE 109* 90 97 94 91  BUN 35* 35* 32* 33* 34*  CREATININE 1.60* 1.56* 1.55* 1.30* 1.52*  CALCIUM 7.4* 7.6* 7.4* 7.3* 7.6*   Liver Function Tests:  Recent Labs Lab 07/30/14 1200  AST 33  ALT 89*  ALKPHOS 87  BILITOT 1.0  PROT 6.1*  ALBUMIN 3.5   No results for input(s): LIPASE, AMYLASE in the last 168 hours. No  results for input(s): AMMONIA in the last 168 hours. CBC:  Recent Labs Lab 07/30/14 1200  WBC 8.8  NEUTROABS 7.5  HGB 14.0  HCT 43.2  MCV 93.5  PLT 271   Cardiac Enzymes:  Recent Labs Lab 07/30/14 1200  TROPONINI 0.03   BNP: BNP (last 3 results)  Recent Labs  07/30/14 1200 08/03/14 0438  BNP 2824.4* 937.4*    ProBNP (last 3 results)  Recent Labs  02/08/14 1655 02/13/14 0405  PROBNP 62377.0* 23222.0*    CBG: No results for input(s): GLUCAP in the last 168 hours.     SignedLouellen Molder  [facility] 08/05/2014, 10:05 AM

## 2014-08-05 NOTE — Care Management Note (Signed)
Case Management Note  Patient Details  Name: Jesse Allen MRN: [id number] Date of Birth: 1925-05-07  Subjective/Objective:                 Action/Plan: To be discharged to [facility] today  Expected Discharge Date:                Expected Discharge Plan:  [place]  In-House Referral:   (Per ED CM note-APS referral-not safe @ home.)  Discharge planning Services  CM Consult  Post Acute Care Choice:    Choice offered to:     DME Arranged:    DME Agency:     HH Arranged:    [place] Agency:     Status of Service:  Completed, signed off  Medicare Important Message Given:  Yes Date Medicare IM Given:  08/03/14 Medicare IM give by:  Dessa Phi Date Additional Medicare IM Given:    Additional Medicare Important Message give by:     If discussed at [place] of Stay Meetings, dates discussed:    Additional Comments:  Dessa Phi, RN 08/05/2014, 12:13 PM

## 2014-08-05 NOTE — Clinical Social Work Placement (Signed)
Patient is set to discharge to [facility] today. Patient & step-daughter, Hoyle Sauer aware. Discharge packet given to RN, Anderson Malta. [facility] called for transport.     Raynaldo Opitz, [facility] Clinical Social Worker cell #: [phone]    CLINICAL SOCIAL WORK PLACEMENT  NOTE  Date:  08/05/2014  Patient Details  Name: Jesse Allen MRN: [id number] Date of Birth: November 09, 1925  Clinical Social Work is seeking post-discharge placement for this patient at the [place] level of care (*CSW will initial, date and re-position this form in  chart as items are completed):  Yes   Patient/family provided with [place] Work Department's list of facilities offering this level of care within the geographic area requested by the patient (or if unable, by the patient's family).  Yes   Patient/family informed of their freedom to choose among providers that offer the needed level of care, that participate in Medicare, Medicaid or managed care program needed by the patient, have an available bed and are willing to accept the patient.  Yes   Patient/family informed of [facility]'s ownership interest in [facility] and [facility], as well as of the fact that they are under no obligation to receive care at these facilities.  PASRR submitted to EDS on 07/31/14     PASRR number received on 07/31/14     Existing PASRR number confirmed on 07/31/14     FL2 transmitted to all facilities in geographic area requested by pt/family on 07/31/14     FL2 transmitted to all facilities within larger geographic area on       Patient informed that his/her managed care company has contracts with or will negotiate with certain facilities, including the following:            Patient/family informed of bed offers received.  Patient chooses bed at [facility]     Physician recommends and patient chooses bed at      Patient to be  transferred to [facility] on 08/05/14.  Patient to be transferred to facility by [facility]     Patient family notified on 08/05/14 of transfer.  Name of family member notified:  patient's step-daughter, Hoyle Sauer & niece-in-law, Romie Minus via phone     PHYSICIAN       Additional Comment:    _______________________________________________ Standley Brooking, LCSW 08/05/2014, 1:42 PM

## 2014-08-05 NOTE — Progress Notes (Signed)
Jesse Allen [phone] and gave report to [facility] who will resume care of pt.

## 2014-08-26 ENCOUNTER — Ambulatory Visit: Payer: Medicare Other | Admitting: Internal Medicine

## 2014-08-26 DIAGNOSIS — Z0289 Encounter for other administrative examinations: Secondary | ICD-10-CM

## 2014-08-28 ENCOUNTER — Ambulatory Visit (INDEPENDENT_AMBULATORY_CARE_PROVIDER_SITE_OTHER): Payer: Medicare Other | Admitting: Cardiology

## 2014-08-28 ENCOUNTER — Encounter: Payer: Self-pay | Admitting: Cardiology

## 2014-08-28 VITALS — BP 132/56 | HR 71 | Ht 71.0 in | Wt 174.0 lb

## 2014-08-28 DIAGNOSIS — F1010 Alcohol abuse, uncomplicated: Secondary | ICD-10-CM | POA: Diagnosis not present

## 2014-08-28 DIAGNOSIS — I10 Essential (primary) hypertension: Secondary | ICD-10-CM

## 2014-08-28 DIAGNOSIS — I429 Cardiomyopathy, unspecified: Secondary | ICD-10-CM

## 2014-08-28 DIAGNOSIS — I5032 Chronic diastolic (congestive) heart failure: Secondary | ICD-10-CM | POA: Diagnosis not present

## 2014-08-28 DIAGNOSIS — I428 Other cardiomyopathies: Secondary | ICD-10-CM

## 2014-08-28 LAB — BASIC METABOLIC PANEL
BUN: 27 mg/dL — ABNORMAL HIGH (ref 6–23)
CO2: 28 mEq/L (ref 19–32)
Calcium: 9 mg/dL (ref 8.4–10.5)
Chloride: 96 mEq/L (ref 96–112)
Creatinine, Ser: 1.42 mg/dL (ref 0.40–1.50)
GFR: 60.6 mL/min (ref >60.00)
Glucose, Bld: 104 mg/dL — ABNORMAL HIGH (ref 70–99)
Potassium: 3.7 mEq/L (ref 3.5–5.1)
Sodium: 134 mEq/L — ABNORMAL LOW (ref 135–145)

## 2014-08-28 NOTE — Patient Instructions (Signed)
Medication Instructions:  None  Labwork: BMET today  Testing/Procedures: None  Follow-Up: Your physician recommends that you schedule a follow-up appointment in: 3 months with Dr. Meda Coffee.    Any Other Special Instructions Will Be Listed Below (If Applicable).

## 2014-08-28 NOTE — Progress Notes (Signed)
Patient ID: NANA HOSELTON, male   DOB: [birth year], 81 y.o.   MRN: [id number]      Cardiology Office Note  Date:  08/28/2014   ID:  DAMONTAY ALRED, DOB December 29, 1925, MRN [id number]  PCP:  Gildardo Cranker, DO  Cardiologist: Dorothy Spark, MD   Chief complain: Post hospitalization follow up for CHF   History of Present Illness: BANE HAGY is a 81 y.o. male with history of alcohol abuse, chronic systolic CHF (with last EF of 35%), noncompliance with medications, hypertension, gout who was admitted on 07/30/2014 for bilateral leg swellings unimproved with Lasix at home. Patient was found to be in acute CHF and admitted to telemetry for IV diuretics. 2-D echo repeated this admission showing worsened EF of 25%. Patient diuresed well and clinically stable to be discharged to skilled nursing facility (as per PT evaluation) His Acute on chronic systolic CHF was Possibly related to medication noncompliance. Good diuresis with IV Lasix and neck negative balance of about 15 L since admission. Euvolemic on discharge. Discharged on Lasix 40 mg twice a day. Needs close monitoring of his daily weights and intake and output as outpatient. Added carvedilol (was on it previously but did not see on his admission medication list), on hydralazine and Imdur at home (patient not compliant to isosorbide). Will discharge him on BiDil. Not on ACE inhibitor due to chronic kidney disease.  Today he states that he has been feeling well, he has no orthopnea, PND, no chest pain, he has mild LE edema, mostly at the end of the day. He spends most of day sitting with legs down. He now lives in a nursing home.   Past Medical History  Diagnosis Date  . Gout   . Hypertension   . CHF (congestive heart failure)    No past surgical history on file.  Current Outpatient Prescriptions  Medication Sig Dispense Refill  . aspirin EC 81 MG EC tablet Take 1 tablet (81 mg total) by mouth daily.    . carvedilol (COREG) 3.125 MG tablet Take  1 tablet (3.125 mg total) by mouth 2 (two) times daily with a meal. 60 tablet 0  . colchicine 0.6 MG tablet Take 1 tablet (0.6 mg total) by mouth 2 (two) times daily.    . folic acid (FOLVITE) 1 MG tablet Take 1 tablet (1 mg total) by mouth daily.    . furosemide (LASIX) 40 MG tablet Take 1 tablet (40 mg total) by mouth 2 (two) times daily. 60 tablet 0  . gabapentin (NEURONTIN) 100 MG capsule Take 1 capsule (100 mg total) by mouth 2 (two) times daily. 60 capsule 1  . isosorbide-hydrALAZINE (BIDIL) 20-37.5 MG per tablet Take 1 tablet by mouth 2 (two) times daily. 60 tablet 0  . Multiple Vitamin (MULTIVITAMIN WITH MINERALS) TABS tablet Take 1 tablet by mouth daily.    . pantoprazole (PROTONIX) 40 MG tablet Take 1 tablet (40 mg total) by mouth daily at 12 noon. 30 tablet 1  . tamsulosin (FLOMAX) 0.4 MG CAPS capsule Take 1 capsule (0.4 mg total) by mouth daily after supper. 30 capsule 1  . traMADol (ULTRAM) 50 MG tablet Take 1 tablet (50 mg total) by mouth 2 (two) times daily. 30 tablet 0   No current facility-administered medications for this visit.   Allergies:   Review of patient's allergies indicates no known allergies.   Social History:  The patient  reports that he has quit smoking. He does not have any smokeless tobacco  history on file. He reports that he drinks alcohol. He reports that he does not use illicit drugs.   Family History:  The patient's family history is not on file.   ROS:  Please see the history of present illness.   Otherwise, review of systems are positive for none.   All other systems are reviewed and negative.   PHYSICAL EXAM: VS:  BP 132/56 mmHg  Pulse 71  Ht 5\' 11"  (1.803 m)  Wt 174 lb (78.926 kg)  BMI 24.28 kg/m2 , BMI Body mass index is 24.28 kg/(m^2). GEN: Well nourished, well developed, in no acute distress HEENT: normal Neck: no JVD, carotid bruits, or masses Cardiac: RRR; 3/6 systolic murmur, rubs, or gallops,no edema  Respiratory:  clear to auscultation  bilaterally, normal work of breathing GI: soft, nontender, nondistended, + BS MS: no deformity or atrophy Skin: warm and dry, no rash Neuro:  Strength and sensation are intact Psych: euthymic mood, full affect  EKG:  EKG is ordered today. The ekg ordered today demonstrates SR, RBBB, RVH, PACs  Recent Labs: 02/13/2014: Pro B Natriuretic peptide (BNP) 23222.0* 07/22/2014: Magnesium 2.5* 07/30/2014: ALT 89*; Hemoglobin 14.0; Platelets 271 08/03/2014: B Natriuretic Peptide 937.4* 08/05/2014: BUN 34*; Creatinine, Ser 1.52*; Potassium 3.9; Sodium 137   Lipid Panel No results found for: CHOL, TRIG, HDL, CHOLHDL, VLDL, LDLCALC, LDLDIRECT   Wt Readings from Last 3 Encounters:  08/28/14 174 lb (78.926 kg)  08/05/14 183 lb 3.2 oz (83.099 kg)  06/10/14 210 lb (95.255 kg)    Other studies Reviewed: Additional studies/ records that were reviewed today include: TTE    ASSESSMENT AND PLAN:  1. Chronic systolic CHF - today euvolemic, CKD stage 3, stable crea to day 1.4, we will continue the same dose of diuretics for now 40 mg po bid. Continue carvedilol, BiDIl, no ACEI/ARB sec to CKD stage 3.  2. HTN - controlled.  3. Etoh abuse - seems to be abstinent now.   4. NICMP - we will repeat TTE in 3 months.   Follow up in 3 months with TTE.   Signed, Dorothy Spark, MD  08/28/2014 11:46 AM    [address], [place], [place]  [address] Phone: [phone]; Fax: [phone]

## 2014-09-11 ENCOUNTER — Encounter: Payer: Self-pay | Admitting: Internal Medicine

## 2014-09-14 ENCOUNTER — Emergency Department (HOSPITAL_COMMUNITY)
Admission: EM | Admit: 2014-09-14 | Discharge: 2014-09-14 | Disposition: A | Discharge status: Home / Self Care | Payer: Medicare Other | Attending: Emergency Medicine | Admitting: Emergency Medicine

## 2014-09-14 ENCOUNTER — Emergency Department (HOSPITAL_COMMUNITY): Payer: Medicare Other

## 2014-09-14 ENCOUNTER — Encounter (HOSPITAL_COMMUNITY): Payer: Self-pay

## 2014-09-14 DIAGNOSIS — N189 Chronic kidney disease, unspecified: Secondary | ICD-10-CM | POA: Diagnosis not present

## 2014-09-14 DIAGNOSIS — I129 Hypertensive chronic kidney disease with stage 1 through stage 4 chronic kidney disease, or unspecified chronic kidney disease: Secondary | ICD-10-CM | POA: Insufficient documentation

## 2014-09-14 DIAGNOSIS — Y92003 Bedroom of unspecified non-institutional (private) residence as the place of occurrence of the external cause: Secondary | ICD-10-CM | POA: Diagnosis not present

## 2014-09-14 DIAGNOSIS — M109 Gout, unspecified: Secondary | ICD-10-CM | POA: Insufficient documentation

## 2014-09-14 DIAGNOSIS — Y9389 Activity, other specified: Secondary | ICD-10-CM | POA: Diagnosis not present

## 2014-09-14 DIAGNOSIS — K219 Gastro-esophageal reflux disease without esophagitis: Secondary | ICD-10-CM | POA: Diagnosis not present

## 2014-09-14 DIAGNOSIS — W06XXXA Fall from bed, initial encounter: Secondary | ICD-10-CM | POA: Insufficient documentation

## 2014-09-14 DIAGNOSIS — Z8739 Personal history of other diseases of the musculoskeletal system and connective tissue: Secondary | ICD-10-CM

## 2014-09-14 DIAGNOSIS — Z79899 Other long term (current) drug therapy: Secondary | ICD-10-CM | POA: Insufficient documentation

## 2014-09-14 DIAGNOSIS — S4992XA Unspecified injury of left shoulder and upper arm, initial encounter: Secondary | ICD-10-CM | POA: Diagnosis not present

## 2014-09-14 DIAGNOSIS — Z8669 Personal history of other diseases of the nervous system and sense organs: Secondary | ICD-10-CM | POA: Insufficient documentation

## 2014-09-14 DIAGNOSIS — Z7982 Long term (current) use of aspirin: Secondary | ICD-10-CM | POA: Insufficient documentation

## 2014-09-14 DIAGNOSIS — I509 Heart failure, unspecified: Secondary | ICD-10-CM | POA: Diagnosis not present

## 2014-09-14 DIAGNOSIS — Z87891 Personal history of nicotine dependence: Secondary | ICD-10-CM | POA: Diagnosis not present

## 2014-09-14 DIAGNOSIS — S199XXA Unspecified injury of neck, initial encounter: Secondary | ICD-10-CM | POA: Insufficient documentation

## 2014-09-14 DIAGNOSIS — M25512 Pain in left shoulder: Secondary | ICD-10-CM

## 2014-09-14 DIAGNOSIS — Y998 Other external cause status: Secondary | ICD-10-CM | POA: Insufficient documentation

## 2014-09-14 HISTORY — DX: Gastro-esophageal reflux disease without esophagitis: K21.9

## 2014-09-14 MED ORDER — TRAMADOL HCL 50 MG PO TABS: 50.0000 mg | ORAL_TABLET | Freq: Two times a day (BID) | ORAL | Status: DC | PRN

## 2014-09-14 NOTE — Discharge Instructions (Signed)
Thank you for allowing [place] to be involved in your healthcare while you were hospitalized at [facility].   Please note that there have been changes to your home medications.  --> PLEASE LOOK AT YOUR DISCHARGE MEDICATION LIST FOR DETAILS.  Please call your PCP if you have any questions or concerns, or any difficulty getting any of your medications.  Please return to the ER if you have worsening of your symptoms or new severe symptoms arise.   FOR PAIN, YOU CAN TAKE TRAMADOL 1 PILL TWICE A DAY AS NEEDED FOR PAIN. YOU CAN ALSO TAKE TYLENOL FOR PAIN. IF PAIN DOES NOT IMPROVED, PLEASE FOLLOW UP WITH YOUR PRIMARY CARE DOCTOR.  Impingement Syndrome, Rotator Cuff, Bursitis with Rehab Impingement syndrome is a condition that involves inflammation of the tendons of the rotator cuff and the subacromial bursa, that causes pain in the shoulder. The rotator cuff consists of four tendons and muscles that control much of the shoulder and upper arm function. The subacromial bursa is a fluid filled sac that helps reduce friction between the rotator cuff and one of the bones of the shoulder (acromion). Impingement syndrome is usually an overuse injury that causes swelling of the bursa (bursitis), swelling of the tendon (tendonitis), and/or a tear of the tendon (strain). Strains are classified into three categories. Grade 1 strains cause pain, but the tendon is not lengthened. Grade 2 strains include a lengthened ligament, due to the ligament being stretched or partially ruptured. With grade 2 strains there is still function, although the function may be decreased. Grade 3 strains include a complete tear of the tendon or muscle, and function is usually impaired. SYMPTOMS   Pain around the shoulder, often at the outer portion of the upper arm.  Pain that gets worse with shoulder function, especially when reaching overhead or lifting.  Sometimes, aching when not using the arm.  Pain that wakes you  up at night.  Sometimes, tenderness, swelling, warmth, or redness over the affected area.  Loss of strength.  Limited motion of the shoulder, especially reaching behind the back (to the back pocket or to unhook bra) or across your body.  Crackling sound (crepitation) when moving the arm.  Biceps tendon pain and inflammation (in the front of the shoulder). Worse when bending the elbow or lifting. CAUSES  Impingement syndrome is often an overuse injury, in which chronic (repetitive) motions cause the tendons or bursa to become inflamed. A strain occurs when a force is paced on the tendon or muscle that is greater than it can withstand. Common mechanisms of injury include: Stress from sudden increase in duration, frequency, or intensity of training.  Direct hit (trauma) to the shoulder.  Aging, erosion of the tendon with normal use.  Bony bump on shoulder (acromial spur). RISK INCREASES WITH:  Contact sports (football, wrestling, boxing).  Throwing sports (baseball, tennis, volleyball).  Weightlifting and bodybuilding.  Heavy labor.  Previous injury to the rotator cuff, including impingement.  Poor shoulder strength and flexibility.  Failure to warm up properly before activity.  Inadequate protective equipment.  Old age.  Bony bump on shoulder (acromial spur). PREVENTION   Warm up and stretch properly before activity.  Allow for adequate recovery between workouts.  Maintain physical fitness:  Strength, flexibility, and endurance.  Cardiovascular fitness.  Learn and use proper exercise technique. PROGNOSIS  If treated properly, impingement syndrome usually goes away within 6 weeks. Sometimes surgery is required.  RELATED COMPLICATIONS   Longer healing time  if not properly treated, or if not given enough time to heal.  Recurring symptoms, that result in a chronic condition.  Shoulder stiffness, frozen shoulder, or loss of motion.  Rotator cuff tendon  tear.  Recurring symptoms, especially if activity is resumed too soon, with overuse, with a direct blow, or when using poor technique. TREATMENT  Treatment first involves the use of ice and medicine, to reduce pain and inflammation. The use of strengthening and stretching exercises may help reduce pain with activity. These exercises may be performed at home or with a therapist. If non-surgical treatment is unsuccessful after more than 6 months, surgery may be advised. After surgery and rehabilitation, activity is usually possible in 3 months.  MEDICATION  If pain medicine is needed, nonsteroidal anti-inflammatory medicines (aspirin and ibuprofen), or other minor pain relievers (acetaminophen), are often advised.  Do not take pain medicine for 7 days before surgery.  Prescription pain relievers may be given, if your caregiver thinks they are needed. Use only as directed and only as much as you need.  Corticosteroid injections may be given by your caregiver. These injections should be reserved for the most serious cases, because they may only be given a certain number of times. HEAT AND COLD  Cold treatment (icing) should be applied for 10 to 15 minutes every 2 to 3 hours for inflammation and pain, and immediately after activity that aggravates your symptoms. Use ice packs or an ice massage.  Heat treatment may be used before performing stretching and strengthening activities prescribed by your caregiver, physical therapist, or athletic trainer. Use a heat pack or a warm water soak. SEEK MEDICAL CARE IF:   Symptoms get worse or do not improve in 4 to 6 weeks, despite treatment.  New, unexplained symptoms develop. (Drugs used in treatment may produce side effects.) EXERCISES  RANGE OF MOTION (ROM) AND STRETCHING EXERCISES - Impingement Syndrome (Rotator Cuff  Tendinitis, Bursitis) These exercises may help you when beginning to rehabilitate your injury. Your symptoms may go away with or  without further involvement from your physician, physical therapist or athletic trainer. While completing these exercises, remember:   Restoring tissue flexibility helps normal motion to return to the joints. This allows healthier, less painful movement and activity.  An effective stretch should be held for at least 30 seconds.  A stretch should never be painful. You should only feel a gentle lengthening or release in the stretched tissue. STRETCH - Flexion, Standing  Stand with good posture. With an underhand grip on your right / left hand, and an overhand grip on the opposite hand, grasp a broomstick or cane so that your hands are a little more than shoulder width apart.  Keeping your right / left elbow straight and shoulder muscles relaxed, push the stick with your opposite hand, to raise your right / left arm in front of your body and then overhead. Raise your arm until you feel a stretch in your right / left shoulder, but before you have increased shoulder pain.  Try to avoid shrugging your right / left shoulder as your arm rises, by keeping your shoulder blade tucked down and toward your mid-back spine. Hold for __________ seconds.  Slowly return to the starting position. Repeat __________ times. Complete this exercise __________ times per day. STRETCH - Abduction, Supine  Lie on your back. With an underhand grip on your right / left hand and an overhand grip on the opposite hand, grasp a broomstick or cane so that your hands  are a little more than shoulder width apart.  Keeping your right / left elbow straight and your shoulder muscles relaxed, push the stick with your opposite hand, to raise your right / left arm out to the side of your body and then overhead. Raise your arm until you feel a stretch in your right / left shoulder, but before you have increased shoulder pain.  Try to avoid shrugging your right / left shoulder as your arm rises, by keeping your shoulder blade tucked down  and toward your mid-back spine. Hold for __________ seconds.  Slowly return to the starting position. Repeat __________ times. Complete this exercise __________ times per day. ROM - Flexion, Active-Assisted  Lie on your back. You may bend your knees for comfort.  Grasp a broomstick or cane so your hands are about shoulder width apart. Your right / left hand should grip the end of the stick, so that your hand is positioned "thumbs-up," as if you were about to shake hands.  Using your healthy arm to lead, raise your right / left arm overhead, until you feel a gentle stretch in your shoulder. Hold for __________ seconds.  Use the stick to assist in returning your right / left arm to its starting position. Repeat __________ times. Complete this exercise __________ times per day.  ROM - Internal Rotation, Supine   Lie on your back on a firm surface. Place your right / left elbow about 60 degrees away from your side. Elevate your elbow with a folded towel, so that the elbow and shoulder are the same height.  Using a broomstick or cane and your strong arm, pull your right / left hand toward your body until you feel a gentle stretch, but no increase in your shoulder pain. Keep your shoulder and elbow in place throughout the exercise.  Hold for __________ seconds. Slowly return to the starting position. Repeat __________ times. Complete this exercise __________ times per day. STRETCH - Internal Rotation  Place your right / left hand behind your back, palm up.  Throw a towel or belt over your opposite shoulder. Grasp the towel with your right / left hand.  While keeping an upright posture, gently pull up on the towel, until you feel a stretch in the front of your right / left shoulder.  Avoid shrugging your right / left shoulder as your arm rises, by keeping your shoulder blade tucked down and toward your mid-back spine.  Hold for __________ seconds. Release the stretch, by lowering your  healthy hand. Repeat __________ times. Complete this exercise __________ times per day. ROM - Internal Rotation   Using an underhand grip, grasp a stick behind your back with both hands.  While standing upright with good posture, slide the stick up your back until you feel a mild stretch in the front of your shoulder.  Hold for __________ seconds. Slowly return to your starting position. Repeat __________ times. Complete this exercise __________ times per day.  STRETCH - Posterior Shoulder Capsule   Stand or sit with good posture. Grasp your right / left elbow and draw it across your chest, keeping it at the same height as your shoulder.  Pull your elbow, so your upper arm comes in closer to your chest. Pull until you feel a gentle stretch in the back of your shoulder.  Hold for __________ seconds. Repeat __________ times. Complete this exercise __________ times per day. STRENGTHENING EXERCISES - Impingement Syndrome (Rotator Cuff Tendinitis, Bursitis) These exercises may help you when  beginning to rehabilitate your injury. They may resolve your symptoms with or without further involvement from your physician, physical therapist or athletic trainer. While completing these exercises, remember:  Muscles can gain both the endurance and the strength needed for everyday activities through controlled exercises.  Complete these exercises as instructed by your physician, physical therapist or athletic trainer. Increase the resistance and repetitions only as guided.  You may experience muscle soreness or fatigue, but the pain or discomfort you are trying to eliminate should never worsen during these exercises. If this pain does get worse, stop and make sure you are following the directions exactly. If the pain is still present after adjustments, discontinue the exercise until you can discuss the trouble with your clinician.  During your recovery, avoid activity or exercises which involve actions  that place your injured hand or elbow above your head or behind your back or head. These positions stress the tissues which you are trying to heal. STRENGTH - Scapular Depression and Adduction   With good posture, sit on a firm chair. Support your arms in front of you, with pillows, arm rests, or on a table top. Have your elbows in line with the sides of your body.  Gently draw your shoulder blades down and toward your mid-back spine. Gradually increase the tension, without tensing the muscles along the top of your shoulders and the back of your neck.  Hold for __________ seconds. Slowly release the tension and relax your muscles completely before starting the next repetition.  After you have practiced this exercise, remove the arm support and complete the exercise in standing as well as sitting position. Repeat __________ times. Complete this exercise __________ times per day.  STRENGTH - Shoulder Abductors, Isometric  With good posture, stand or sit about 4-6 inches from a wall, with your right / left side facing the wall.  Bend your right / left elbow. Gently press your right / left elbow into the wall. Increase the pressure gradually, until you are pressing as hard as you can, without shrugging your shoulder or increasing any shoulder discomfort.  Hold for __________ seconds.  Release the tension slowly. Relax your shoulder muscles completely before you begin the next repetition. Repeat __________ times. Complete this exercise __________ times per day.  STRENGTH - External Rotators, Isometric  Keep your right / left elbow at your side and bend it 90 degrees.  Step into a door frame so that the outside of your right / left wrist can press against the door frame without your upper arm leaving your side.  Gently press your right / left wrist into the door frame, as if you were trying to swing the back of your hand away from your stomach. Gradually increase the tension, until you are  pressing as hard as you can, without shrugging your shoulder or increasing any shoulder discomfort.  Hold for __________ seconds.  Release the tension slowly. Relax your shoulder muscles completely before you begin the next repetition. Repeat __________ times. Complete this exercise __________ times per day.  STRENGTH - Supraspinatus   Stand or sit with good posture. Grasp a __________ weight, or an exercise band or tubing, so that your hand is "thumbs-up," like you are shaking hands.  Slowly lift your right / left arm in a "V" away from your thigh, diagonally into the space between your side and straight ahead. Lift your hand to shoulder height or as far as you can, without increasing any shoulder pain. At first, many  people do not lift their hands above shoulder height.  Avoid shrugging your right / left shoulder as your arm rises, by keeping your shoulder blade tucked down and toward your mid-back spine.  Hold for __________ seconds. Control the descent of your hand, as you slowly return to your starting position. Repeat __________ times. Complete this exercise __________ times per day.  STRENGTH - External Rotators  Secure a rubber exercise band or tubing to a fixed object (table, pole) so that it is at the same height as your right / left elbow when you are standing or sitting on a firm surface.  Stand or sit so that the secured exercise band is at your uninjured side.  Bend your right / left elbow 90 degrees. Place a folded towel or small pillow under your right / left arm, so that your elbow is a few inches away from your side.  Keeping the tension on the exercise band, pull it away from your body, as if pivoting on your elbow. Be sure to keep your body steady, so that the movement is coming only from your rotating shoulder.  Hold for __________ seconds. Release the tension in a controlled manner, as you return to the starting position. Repeat __________ times. Complete this  exercise __________ times per day.  STRENGTH - Internal Rotators   Secure a rubber exercise band or tubing to a fixed object (table, pole) so that it is at the same height as your right / left elbow when you are standing or sitting on a firm surface.  Stand or sit so that the secured exercise band is at your right / left side.  Bend your elbow 90 degrees. Place a folded towel or small pillow under your right / left arm so that your elbow is a few inches away from your side.  Keeping the tension on the exercise band, pull it across your body, toward your stomach. Be sure to keep your body steady, so that the movement is coming only from your rotating shoulder.  Hold for __________ seconds. Release the tension in a controlled manner, as you return to the starting position. Repeat __________ times. Complete this exercise __________ times per day.  STRENGTH - Scapular Protractors, Standing   Stand arms length away from a wall. Place your hands on the wall, keeping your elbows straight.  Begin by dropping your shoulder blades down and toward your mid-back spine.  To strengthen your protractors, keep your shoulder blades down, but slide them forward on your rib cage. It will feel as if you are lifting the back of your rib cage away from the wall. This is a subtle motion and can be challenging to complete. Ask your caregiver for further instruction, if you are not sure you are doing the exercise correctly.  Hold for __________ seconds. Slowly return to the starting position, resting the muscles completely before starting the next repetition. Repeat __________ times. Complete this exercise __________ times per day. STRENGTH - Scapular Protractors, Supine  Lie on your back on a firm surface. Extend your right / left arm straight into the air while holding a __________ weight in your hand.  Keeping your head and back in place, lift your shoulder off the floor.  Hold for __________ seconds. Slowly  return to the starting position, and allow your muscles to relax completely before starting the next repetition. Repeat __________ times. Complete this exercise __________ times per day. STRENGTH - Scapular Protractors, Quadruped  Get onto your hands and  knees, with your shoulders directly over your hands (or as close as you can be, comfortably).  Keeping your elbows locked, lift the back of your rib cage up into your shoulder blades, so your mid-back rounds out. Keep your neck muscles relaxed.  Hold this position for __________ seconds. Slowly return to the starting position and allow your muscles to relax completely before starting the next repetition. Repeat __________ times. Complete this exercise __________ times per day.  STRENGTH - Scapular Retractors  Secure a rubber exercise band or tubing to a fixed object (table, pole), so that it is at the height of your shoulders when you are either standing, or sitting on a firm armless chair.  With a palm down grip, grasp an end of the band in each hand. Straighten your elbows and lift your hands straight in front of you, at shoulder height. Step back, away from the secured end of the band, until it becomes tense.  Squeezing your shoulder blades together, draw your elbows back toward your sides, as you bend them. Keep your upper arms lifted away from your body throughout the exercise.  Hold for __________ seconds. Slowly ease the tension on the band, as you reverse the directions and return to the starting position. Repeat __________ times. Complete this exercise __________ times per day. STRENGTH - Shoulder Extensors   Secure a rubber exercise band or tubing to a fixed object (table, pole) so that it is at the height of your shoulders when you are either standing, or sitting on a firm armless chair.  With a thumbs-up grip, grasp an end of the band in each hand. Straighten your elbows and lift your hands straight in front of you, at shoulder  height. Step back, away from the secured end of the band, until it becomes tense.  Squeezing your shoulder blades together, pull your hands down to the sides of your thighs. Do not allow your hands to go behind you.  Hold for __________ seconds. Slowly ease the tension on the band, as you reverse the directions and return to the starting position. Repeat __________ times. Complete this exercise __________ times per day.  STRENGTH - Scapular Retractors and External Rotators   Secure a rubber exercise band or tubing to a fixed object (table, pole) so that it is at the height as your shoulders, when you are either standing, or sitting on a firm armless chair.  With a palm down grip, grasp an end of the band in each hand. Bend your elbows 90 degrees and lift your elbows to shoulder height, at your sides. Step back, away from the secured end of the band, until it becomes tense.  Squeezing your shoulder blades together, rotate your shoulders so that your upper arms and elbows remain stationary, but your fists travel upward to head height.  Hold for __________ seconds. Slowly ease the tension on the band, as you reverse the directions and return to the starting position. Repeat __________ times. Complete this exercise __________ times per day.  STRENGTH - Scapular Retractors and External Rotators, Rowing   Secure a rubber exercise band or tubing to a fixed object (table, pole) so that it is at the height of your shoulders, when you are either standing, or sitting on a firm armless chair.  With a palm down grip, grasp an end of the band in each hand. Straighten your elbows and lift your hands straight in front of you, at shoulder height. Step back, away from the secured end of  the band, until it becomes tense.  Step 1: Squeeze your shoulder blades together. Bending your elbows, draw your hands to your chest, as if you are rowing a boat. At the end of this motion, your hands and elbow should be at  shoulder height and your elbows should be out to your sides.  Step 2: Rotate your shoulders, to raise your hands above your head. Your forearms should be vertical and your upper arms should be horizontal.  Hold for __________ seconds. Slowly ease the tension on the band, as you reverse the directions and return to the starting position. Repeat __________ times. Complete this exercise __________ times per day.  STRENGTH - Scapular Depressors  Find a sturdy chair without wheels, such as a dining room chair.  Keeping your feet on the floor, and your hands on the chair arms, lift your bottom up from the seat, and lock your elbows.  Keeping your elbows straight, allow gravity to pull your body weight down. Your shoulders will rise toward your ears.  Raise your body against gravity by drawing your shoulder blades down your back, shortening the distance between your shoulders and ears. Although your feet should always maintain contact with the floor, your feet should progressively support less body weight, as you get stronger.  Hold for __________ seconds. In a controlled and slow manner, lower your body weight to begin the next repetition. Repeat __________ times. Complete this exercise __________ times per day.  Document Released: 03/06/2005 Document Revised: 05/29/2011 Document Reviewed: 06/18/2008 [facility] Patient Information 2015 [place], [place]. This information is not intended to replace advice given to you by your health care provider. Make sure you discuss any questions you have with your health care provider.

## 2014-09-14 NOTE — ED Provider Notes (Signed)
CSN: [id number]     Arrival date & time 09/14/14  9528 History   First MD Initiated Contact with Patient 09/14/14 [phone]     Chief Complaint  Patient presents with  . Fall   HPI  Jesse Allen is an 81 yo male with PMHx of HTN, CHF and GERD who presents with complaint of left shoulder and neck pain after fall. Patient rolled out of his bed, which is about 2 feet off the floor, and landed on his left side. This is his second time this week. His living facility is waiting on a hospital bed for him. Prior to falling out bed, patient denies any chest pain, shortness of breath, headache, difficult speech, increased weakness or loss of sensation. Patient is not sure exactly what happened.   Pain is a 4-5/10 in left shoulder and left lateral neck. He denies any headache.   Past Medical History  Diagnosis Date  . Gout   . Hypertension   . CHF (congestive heart failure)   . GERD (gastroesophageal reflux disease)    History reviewed. No pertinent past surgical history. History reviewed. No pertinent family history. History  Substance Use Topics  . Smoking status: Former Smoker -- 40 years  . Smokeless tobacco: Not on file     Comment: Quit 20 years ago as of 2016  . Alcohol Use: 0.0 oz/week    0 Standard drinks or equivalent per week     Comment: today- "a double shot" (occasional use)    Review of Systems General: Denies fever, chills, fatigue Respiratory: Denies SOB, cough Cardiovascular: Denies chest pain and palpitations.  Gastrointestinal: Denies nausea, vomiting, abdominal pain Musculoskeletal: Admits to left shoulder pain and neck pain and myalgia. Denies back pain Skin: Denies rash and wounds.  Neurological: Denies dizziness, headaches, weakness, lightheadedness, numbness, seizures, and syncope.   Allergies  Review of patient's allergies indicates no known allergies.  Home Medications   Prior to Admission medications   Medication Sig Start Date End Date Taking? Authorizing  Provider  aspirin EC 81 MG EC tablet Take 1 tablet (81 mg total) by mouth daily. 12/01/12  Yes Barton Dubois, MD  carvedilol (COREG) 3.125 MG tablet Take 1 tablet (3.125 mg total) by mouth 2 (two) times daily with a meal. 08/05/14  Yes Nishant Dhungel, MD  Cholecalciferol (VITAMIN D3) 50000 UNITS TABS Take 1 tablet by mouth once a week.   Yes Historical Provider, MD  colchicine 0.6 MG tablet Take 1 tablet (0.6 mg total) by mouth 2 (two) times daily. 05/02/14  Yes Lauree Chandler, NP  folic acid (FOLVITE) 1 MG tablet Take 1 tablet (1 mg total) by mouth daily. 02/16/14  Yes Hosie Poisson, MD  furosemide (LASIX) 40 MG tablet Take 1 tablet (40 mg total) by mouth 2 (two) times daily. 08/05/14  Yes Nishant Dhungel, MD  gabapentin (NEURONTIN) 100 MG capsule Take 1 capsule (100 mg total) by mouth 2 (two) times daily. 06/10/14  Yes Monica Carter, DO  isosorbide-hydrALAZINE (BIDIL) 20-37.5 MG per tablet Take 1 tablet by mouth 2 (two) times daily. 08/05/14  Yes Nishant Dhungel, MD  levothyroxine (SYNTHROID, LEVOTHROID) 50 MCG tablet Take 50 mcg by mouth daily before breakfast.   Yes Historical Provider, MD  Multiple Vitamin (MULTIVITAMIN WITH MINERALS) TABS tablet Take 1 tablet by mouth daily. 02/13/14  Yes Hosie Poisson, MD  pantoprazole (PROTONIX) 40 MG tablet Take 1 tablet (40 mg total) by mouth daily at 12 noon. 12/01/12  Yes Barton Dubois, MD  tamsulosin (  FLOMAX) 0.4 MG CAPS capsule Take 1 capsule (0.4 mg total) by mouth daily after supper. 12/01/12  Yes Barton Dubois, MD  traMADol (ULTRAM) 50 MG tablet Take 1 tablet (50 mg total) by mouth 2 (two) times daily as needed. 09/14/14   Alexa Sherral Hammers, MD   Physical Exam  Filed Vitals:   09/14/14 3614 09/14/14 0845 09/14/14 0900 09/14/14 0945  BP: 132/63 138/67 141/57 133/58  Pulse: 75 74 65 71  Temp: 98.6 F (37 C)     TempSrc: Oral     Resp: 18 19 18 20   Height: 5\' 9"  (1.753 m)     Weight: 165 lb (74.844 kg)     SpO2: 96% 94% 96% 96%   General: Vital  signs reviewed.  Patient is well-developed and well-nourished, in no acute distress and cooperative with exam.  Head: Normocephalic and atraumatic. Eyes: EOMI, conjunctivae normal Neck: Tender on palpation of lateral neck. Normal sensation and ROM, no obvious trauma or ecchymosis.   Cardiovascular: RRR, S1 normal, S2 normal Pulmonary/Chest: Clear to auscultation bilaterally, no wheezes, rales, or rhonchi. Abdominal: Soft, non-tender, non-distended, BS + Musculoskeletal: Tender on palpation of left shoulder, normal ROM, normal sensation, no obvious trauma or ecchymosis.  Extremities: No lower extremity edema bilaterally, woody vascular insufficiency changes. Neurological: A&O x2, Strength is normal and symmetric bilaterally, cranial nerve II-XII are grossly intact, no focal motor deficit, sensory intact to light touch bilaterally.   ED Course  Procedures (including critical care time) Labs Review Labs Reviewed - No data to display  Imaging Review Dg Cervical Spine 2-3 Views  09/14/2014   CLINICAL DATA:  [place] out of the head, 2 feet to the ground. Left-sided neck pain.  EXAM: CERVICAL SPINE - 2-3 VIEW  COMPARISON:  None.  FINDINGS: Two-view examination shows chronic degenerative spondylosis from C3-4 through C6-7. No visible fracture or soft tissue swelling.  IMPRESSION: No traumatic finding on this two view study. Chronic degenerative spondylosis.   Electronically Signed   By: Nelson Chimes M.D.   On: 09/14/2014 09:28   Dg Shoulder Left  09/14/2014   CLINICAL DATA:  Rolled out of bed this morning and fell 2 feet to the ground. Shoulder pain.  EXAM: LEFT SHOULDER - 2+ VIEW  COMPARISON:  None.  FINDINGS: There chronic degenerative changes of the glenohumeral joint with cystic change in the inferior glenoid. Humeral head appears properly located. Narrowing of the distance between the humeral head and acromial arch could indicate rotator cuff tear, age indeterminate. No evidence of AC joint  dislocation or clavicle fracture.  IMPRESSION: No acute fracture or dislocation. Chronic degenerative changes of the glenohumeral joint. Narrowing of the humeral acromial distance consistent with rotator cuff tear, age indeterminate.   Electronically Signed   By: Nelson Chimes M.D.   On: 09/14/2014 09:27     MDM   Final diagnoses:  Shoulder pain, acute, left  History of rotator cuff syndrome  CKD (chronic kidney disease), unspecified stage   81 yo male presenting with complaint of left shoulder and neck pain after falling out of his bed. Cervical spine and left shoulder xray obtained to rule out fracture.   Cervical spine xray showed no traumatic finding and chronic degenerative spondylosis.  Left shoulder xray showed no acute fracture or dislocation. Chronic degenerative changes of the glenohumeral joint. Narrowing of the humeral acromial distance consistent with rotator cuff tear, age indeterminate. Physical exam is not consistent with a acute tear as patient has normal passive and active ROM  and is not in severe pain. We recommend conservative treatment with physical therapy and pain control. Given CKD, we recommend tylenol and tramadol. Patient is safe to be discharged back to living facility.  Case discussed in full with ED attending physician, Dr. Audie Pinto.  Osa Craver, DO PGY-1 Internal Medicine Resident Pager # [phone] 09/14/2014 10:01 AM     Roxine Caddy Sherral Hammers, MD 09/14/14 1001  Leonard Schwartz, MD 09/14/14 1010

## 2014-09-14 NOTE — ED Notes (Signed)
[facility] called for transport.  

## 2014-09-14 NOTE — ED Notes (Signed)
Pt from [facility].  Pt rolled out of bed this morning which was aprox. Two feet off of ground.  Facility reports this is the second time this has happened in a week.  Pt is c/o left lateral neck and shoulder pain.  No LOC.  Pt does have a reddened area to the right eyebrow.

## 2014-09-22 ENCOUNTER — Emergency Department (HOSPITAL_COMMUNITY): Payer: Medicare Other

## 2014-09-22 ENCOUNTER — Encounter (HOSPITAL_COMMUNITY): Payer: Self-pay | Admitting: Physical Medicine and Rehabilitation

## 2014-09-22 ENCOUNTER — Inpatient Hospital Stay (HOSPITAL_COMMUNITY)
Admission: EM | Admit: 2014-09-22 | Discharge: 2014-09-25 | DRG: 682 | Disposition: A | Discharge status: Hospice | Payer: Medicare Other | Attending: Internal Medicine | Admitting: Internal Medicine

## 2014-09-22 DIAGNOSIS — N179 Acute kidney failure, unspecified: Principal | ICD-10-CM | POA: Diagnosis present

## 2014-09-22 DIAGNOSIS — Z6821 Body mass index (BMI) 21.0-21.9, adult: Secondary | ICD-10-CM

## 2014-09-22 DIAGNOSIS — Z87891 Personal history of nicotine dependence: Secondary | ICD-10-CM

## 2014-09-22 DIAGNOSIS — R740 Nonspecific elevation of levels of transaminase and lactic acid dehydrogenase [LDH]: Secondary | ICD-10-CM | POA: Diagnosis present

## 2014-09-22 DIAGNOSIS — Z7982 Long term (current) use of aspirin: Secondary | ICD-10-CM | POA: Diagnosis not present

## 2014-09-22 DIAGNOSIS — R627 Adult failure to thrive: Secondary | ICD-10-CM | POA: Diagnosis present

## 2014-09-22 DIAGNOSIS — Z66 Do not resuscitate: Secondary | ICD-10-CM | POA: Diagnosis present

## 2014-09-22 DIAGNOSIS — E860 Dehydration: Secondary | ICD-10-CM | POA: Diagnosis present

## 2014-09-22 DIAGNOSIS — I5042 Chronic combined systolic (congestive) and diastolic (congestive) heart failure: Secondary | ICD-10-CM | POA: Diagnosis present

## 2014-09-22 DIAGNOSIS — F1010 Alcohol abuse, uncomplicated: Secondary | ICD-10-CM | POA: Diagnosis present

## 2014-09-22 DIAGNOSIS — E43 Unspecified severe protein-calorie malnutrition: Secondary | ICD-10-CM | POA: Diagnosis present

## 2014-09-22 DIAGNOSIS — B964 Proteus (mirabilis) (morganii) as the cause of diseases classified elsewhere: Secondary | ICD-10-CM | POA: Diagnosis present

## 2014-09-22 DIAGNOSIS — N390 Urinary tract infection, site not specified: Secondary | ICD-10-CM | POA: Diagnosis present

## 2014-09-22 DIAGNOSIS — Z7189 Other specified counseling: Secondary | ICD-10-CM

## 2014-09-22 DIAGNOSIS — G9340 Encephalopathy, unspecified: Secondary | ICD-10-CM | POA: Diagnosis not present

## 2014-09-22 DIAGNOSIS — I509 Heart failure, unspecified: Secondary | ICD-10-CM

## 2014-09-22 DIAGNOSIS — K219 Gastro-esophageal reflux disease without esophagitis: Secondary | ICD-10-CM | POA: Diagnosis present

## 2014-09-22 DIAGNOSIS — I428 Other cardiomyopathies: Secondary | ICD-10-CM | POA: Diagnosis present

## 2014-09-22 DIAGNOSIS — Z79899 Other long term (current) drug therapy: Secondary | ICD-10-CM

## 2014-09-22 DIAGNOSIS — I129 Hypertensive chronic kidney disease with stage 1 through stage 4 chronic kidney disease, or unspecified chronic kidney disease: Secondary | ICD-10-CM | POA: Diagnosis present

## 2014-09-22 DIAGNOSIS — R7401 Elevation of levels of liver transaminase levels: Secondary | ICD-10-CM | POA: Diagnosis present

## 2014-09-22 DIAGNOSIS — N189 Chronic kidney disease, unspecified: Secondary | ICD-10-CM | POA: Diagnosis present

## 2014-09-22 DIAGNOSIS — B370 Candidal stomatitis: Secondary | ICD-10-CM | POA: Diagnosis present

## 2014-09-22 DIAGNOSIS — Z515 Encounter for palliative care: Secondary | ICD-10-CM

## 2014-09-22 DIAGNOSIS — M109 Gout, unspecified: Secondary | ICD-10-CM | POA: Diagnosis present

## 2014-09-22 DIAGNOSIS — N3000 Acute cystitis without hematuria: Secondary | ICD-10-CM | POA: Diagnosis not present

## 2014-09-22 LAB — TSH: TSH: 1.044 u[IU]/mL (ref 0.350–4.500)

## 2014-09-22 LAB — COMPREHENSIVE METABOLIC PANEL
ALK PHOS: 144 U/L — AB (ref 38–126)
ALT: 101 U/L — ABNORMAL HIGH (ref 17–63)
AST: 90 U/L — AB (ref 15–41)
Albumin: 3 g/dL — ABNORMAL LOW (ref 3.5–5.0)
Anion gap: 16 — ABNORMAL HIGH (ref 5–15)
BILIRUBIN TOTAL: 1.1 mg/dL (ref 0.3–1.2)
BUN: 128 mg/dL — AB (ref 6–20)
CHLORIDE: 107 mmol/L (ref 101–111)
CO2: 21 mmol/L — ABNORMAL LOW (ref 22–32)
Calcium: 8.1 mg/dL — ABNORMAL LOW (ref 8.9–10.3)
Creatinine, Ser: 3.32 mg/dL — ABNORMAL HIGH (ref 0.61–1.24)
GFR calc Af Amer: 18 mL/min — ABNORMAL LOW (ref >60)
GFR, EST NON AFRICAN AMERICAN: 15 mL/min — AB (ref >60)
Glucose, Bld: 107 mg/dL — ABNORMAL HIGH (ref 65–99)
POTASSIUM: 4.4 mmol/L (ref 3.5–5.1)
Sodium: 144 mmol/L (ref 135–145)
Total Protein: 6.6 g/dL (ref 6.5–8.1)

## 2014-09-22 LAB — CBC WITH DIFFERENTIAL/PLATELET
Basophils Absolute: 0 10*3/uL (ref 0.0–0.1)
Basophils Relative: 0 % (ref 0–1)
EOS PCT: 0 % (ref 0–5)
Eosinophils Absolute: 0 10*3/uL (ref 0.0–0.7)
HEMATOCRIT: 47.8 % (ref 39.0–52.0)
Hemoglobin: 16.2 g/dL (ref 13.0–17.0)
LYMPHS PCT: 10 % — AB (ref 12–46)
Lymphs Abs: 0.5 10*3/uL — ABNORMAL LOW (ref 0.7–4.0)
MCH: 29.5 pg (ref 26.0–34.0)
MCHC: 33.9 g/dL (ref 30.0–36.0)
MCV: 87.1 fL (ref 78.0–100.0)
MONO ABS: 0.3 10*3/uL (ref 0.1–1.0)
MONOS PCT: 5 % (ref 3–12)
Neutro Abs: 4.2 10*3/uL (ref 1.7–7.7)
Neutrophils Relative %: 84 % — ABNORMAL HIGH (ref 43–77)
Platelets: 176 10*3/uL (ref 150–400)
RBC: 5.49 MIL/uL (ref 4.22–5.81)
RDW: 16.1 % — ABNORMAL HIGH (ref 11.5–15.5)
WBC: 5 10*3/uL (ref 4.0–10.5)

## 2014-09-22 LAB — CBC
HCT: 47.5 % (ref 39.0–52.0)
HEMOGLOBIN: 15.7 g/dL (ref 13.0–17.0)
MCH: 28.8 pg (ref 26.0–34.0)
MCHC: 33.1 g/dL (ref 30.0–36.0)
MCV: 87.2 fL (ref 78.0–100.0)
PLATELETS: 123 10*3/uL — AB (ref 150–400)
RBC: 5.45 MIL/uL (ref 4.22–5.81)
RDW: 16.2 % — ABNORMAL HIGH (ref 11.5–15.5)
WBC: 3.6 10*3/uL — AB (ref 4.0–10.5)

## 2014-09-22 LAB — URINALYSIS, ROUTINE W REFLEX MICROSCOPIC
BILIRUBIN URINE: NEGATIVE
Glucose, UA: NEGATIVE mg/dL
Ketones, ur: NEGATIVE mg/dL
Nitrite: POSITIVE — AB
PH: 7 (ref 5.0–8.0)
Protein, ur: NEGATIVE mg/dL
SPECIFIC GRAVITY, URINE: 1.015 (ref 1.005–1.030)
Urobilinogen, UA: 0.2 mg/dL (ref 0.0–1.0)

## 2014-09-22 LAB — URINE MICROSCOPIC-ADD ON

## 2014-09-22 LAB — CREATININE, SERUM
Creatinine, Ser: 2.9 mg/dL — ABNORMAL HIGH (ref 0.61–1.24)
GFR calc Af Amer: 21 mL/min — ABNORMAL LOW (ref >60)
GFR calc non Af Amer: 18 mL/min — ABNORMAL LOW (ref >60)

## 2014-09-22 LAB — MAGNESIUM: Magnesium: 2.8 mg/dL — ABNORMAL HIGH (ref 1.7–2.4)

## 2014-09-22 LAB — MRSA PCR SCREENING: MRSA BY PCR: NEGATIVE

## 2014-09-22 LAB — ETHANOL: Alcohol, Ethyl (B): <5 mg/dL (ref <5)

## 2014-09-22 LAB — I-STAT CG4 LACTIC ACID, ED: Lactic Acid, Venous: 1.28 mmol/L (ref 0.5–2.0)

## 2014-09-22 LAB — PHOSPHORUS: Phosphorus: 6 mg/dL — ABNORMAL HIGH (ref 2.5–4.6)

## 2014-09-22 MED ORDER — LEVOTHYROXINE SODIUM 50 MCG PO TABS
50.0000 ug | ORAL_TABLET | Freq: Every day | ORAL | Status: DC
  Administered 2014-09-23 – 2014-09-24 (×2): 50 ug via ORAL
  Filled 2014-09-22 (×3): qty 1

## 2014-09-22 MED ORDER — ENOXAPARIN SODIUM 40 MG/0.4ML ~~LOC~~ SOLN: 40.0000 mg | SUBCUTANEOUS | Status: DC

## 2014-09-22 MED ORDER — ADULT MULTIVITAMIN W/MINERALS CH
1.0000 | ORAL_TABLET | Freq: Every day | ORAL | Status: DC
  Administered 2014-09-22 – 2014-09-24 (×3): 1 via ORAL
  Filled 2014-09-22 (×3): qty 1

## 2014-09-22 MED ORDER — ISOSORB DINITRATE-HYDRALAZINE 20-37.5 MG PO TABS
1.0000 | ORAL_TABLET | Freq: Two times a day (BID) | ORAL | Status: DC
  Administered 2014-09-22 – 2014-09-24 (×5): 1 via ORAL
  Filled 2014-09-22 (×7): qty 1

## 2014-09-22 MED ORDER — CARVEDILOL 3.125 MG PO TABS
3.1250 mg | ORAL_TABLET | Freq: Two times a day (BID) | ORAL | Status: DC
  Administered 2014-09-22 – 2014-09-24 (×3): 3.125 mg via ORAL
  Filled 2014-09-22 (×6): qty 1

## 2014-09-22 MED ORDER — TAMSULOSIN HCL 0.4 MG PO CAPS
0.4000 mg | ORAL_CAPSULE | Freq: Every day | ORAL | Status: DC
  Administered 2014-09-22 – 2014-09-23 (×2): 0.4 mg via ORAL
  Filled 2014-09-22 (×3): qty 1

## 2014-09-22 MED ORDER — LORAZEPAM 2 MG/ML IJ SOLN: 1.0000 mg | Freq: Four times a day (QID) | INTRAMUSCULAR | Status: DC | PRN

## 2014-09-22 MED ORDER — SODIUM CHLORIDE 0.9 % IV SOLN
1000.0000 mL | Freq: Once | INTRAVENOUS | Status: AC
  Administered 2014-09-22: 1000 mL via INTRAVENOUS

## 2014-09-22 MED ORDER — LORAZEPAM 1 MG PO TABS: 1.0000 mg | ORAL_TABLET | Freq: Four times a day (QID) | ORAL | Status: DC | PRN

## 2014-09-22 MED ORDER — THIAMINE HCL 100 MG/ML IJ SOLN
100.0000 mg | Freq: Every day | INTRAMUSCULAR | Status: DC
  Filled 2014-09-22 (×3): qty 1

## 2014-09-22 MED ORDER — ENOXAPARIN SODIUM 30 MG/0.3ML ~~LOC~~ SOLN
30.0000 mg | SUBCUTANEOUS | Status: DC
  Administered 2014-09-22 – 2014-09-23 (×2): 30 mg via SUBCUTANEOUS
  Filled 2014-09-22 (×3): qty 0.3

## 2014-09-22 MED ORDER — SODIUM CHLORIDE 0.9 % IJ SOLN
3.0000 mL | Freq: Two times a day (BID) | INTRAMUSCULAR | Status: DC
  Administered 2014-09-22 – 2014-09-25 (×5): 3 mL via INTRAVENOUS

## 2014-09-22 MED ORDER — THIAMINE HCL 100 MG/ML IJ SOLN: 100.0000 mg | Freq: Every day | INTRAMUSCULAR | Status: DC

## 2014-09-22 MED ORDER — SODIUM CHLORIDE 0.9 % IV BOLUS (SEPSIS)
1000.0000 mL | Freq: Once | INTRAVENOUS | Status: AC
  Administered 2014-09-22: 1000 mL via INTRAVENOUS

## 2014-09-22 MED ORDER — VITAMIN B-1 100 MG PO TABS
100.0000 mg | ORAL_TABLET | Freq: Every day | ORAL | Status: DC
Start: 2014-09-22 — End: 2014-09-24
  Administered 2014-09-22 – 2014-09-24 (×3): 100 mg via ORAL
  Filled 2014-09-22 (×3): qty 1

## 2014-09-22 MED ORDER — FOLIC ACID 5 MG/ML IJ SOLN
1.0000 mg | Freq: Every day | INTRAMUSCULAR | Status: DC
  Administered 2014-09-22: 1 mg via INTRAVENOUS
  Filled 2014-09-22 (×4): qty 0.2

## 2014-09-22 MED ORDER — ADULT MULTIVITAMIN W/MINERALS CH: 1.0000 | ORAL_TABLET | Freq: Every day | ORAL | Status: DC

## 2014-09-22 MED ORDER — FOLIC ACID 1 MG PO TABS
1.0000 mg | ORAL_TABLET | Freq: Every day | ORAL | Status: DC
  Administered 2014-09-22 – 2014-09-24 (×3): 1 mg via ORAL
  Filled 2014-09-22 (×3): qty 1

## 2014-09-22 MED ORDER — SERTRALINE HCL 25 MG PO TABS
12.5000 mg | ORAL_TABLET | Freq: Every day | ORAL | Status: DC
  Administered 2014-09-22 – 2014-09-24 (×3): 12.5 mg via ORAL
  Filled 2014-09-22 (×3): qty 0.5

## 2014-09-22 MED ORDER — COLCHICINE 0.6 MG PO TABS
0.6000 mg | ORAL_TABLET | Freq: Two times a day (BID) | ORAL | Status: DC
  Administered 2014-09-22 (×2): 0.6 mg via ORAL
  Filled 2014-09-22 (×4): qty 1

## 2014-09-22 MED ORDER — SODIUM CHLORIDE 0.9 % IV SOLN
INTRAVENOUS | Status: AC
  Administered 2014-09-22: 17:00:00 via INTRAVENOUS

## 2014-09-22 MED ORDER — ONDANSETRON HCL 4 MG PO TABS: 4.0000 mg | ORAL_TABLET | Freq: Four times a day (QID) | ORAL | Status: DC | PRN

## 2014-09-22 MED ORDER — PANTOPRAZOLE SODIUM 40 MG IV SOLR
40.0000 mg | INTRAVENOUS | Status: DC
  Administered 2014-09-22 – 2014-09-23 (×2): 40 mg via INTRAVENOUS
  Filled 2014-09-22 (×4): qty 40

## 2014-09-22 MED ORDER — ASPIRIN EC 81 MG PO TBEC
81.0000 mg | DELAYED_RELEASE_TABLET | Freq: Every day | ORAL | Status: DC
  Administered 2014-09-22 – 2014-09-24 (×3): 81 mg via ORAL
  Filled 2014-09-22 (×3): qty 1

## 2014-09-22 MED ORDER — ONDANSETRON HCL 4 MG/2ML IJ SOLN: 4.0000 mg | Freq: Four times a day (QID) | INTRAMUSCULAR | Status: DC | PRN

## 2014-09-22 MED ORDER — SODIUM CHLORIDE 0.9 % IV SOLN
1000.0000 mL | INTRAVENOUS | Status: DC
  Administered 2014-09-22: 1000 mL via INTRAVENOUS

## 2014-09-22 NOTE — Progress Notes (Signed)
1330 admitted from Ed via  [employer]

## 2014-09-22 NOTE — ED Notes (Signed)
Pt confused upon arrival, facility reports altered mental status, decreased appetite and diarrhea. Pt had large runny bowel movement upon arrival. Mucus membranes dry. Pt is alert, but unable to answer questions.

## 2014-09-22 NOTE — ED Notes (Signed)
Lab at bedside

## 2014-09-22 NOTE — ED Notes (Signed)
Family requesting update and information on pt, Dr. Vanita Panda notified and en route to bedside.

## 2014-09-22 NOTE — ED Notes (Signed)
Pt presents to department via [facility] from [facility] for evaluation of altered mental status and failure to thrive. Staff reports recent confusion and decreased appetite. Also states frequent falls. Pt is alert upon arrival, but unable to answer questions. 20g R wrist.

## 2014-09-22 NOTE — H&P (Signed)
[facility] History and Physical  Jesse Allen YIF:[id number] DOB: [birth year] DOA: 09/22/2014  Referring physician: ED physician, Dr. Vanita Panda  PCP: Gildardo Cranker, DO   Chief Complaint: altered mental status   HPI:  Pt is 81 yo male, resident of SNF, presented to [facility] ED from SNF for evaluation of 1-2 days duration of progressive lethargy, poor oral intake. Please note that pt is unable to provide any history at the time of the admission due to altered mental status. Most of the information obtained from ED doctor Dr. Vanita Panda, no reported fevers, chills, no reported abd or urinary concerns. Review of records indicate pt has known history of alcohol abuse but this was reported per SNF staff.   In Ed, pt visibly altered and confused, unable to provide nay history but not in acute distress, VSS, blood work notable for Cr 3.32, transaminitis. [facility] asked to admit for further evaluation. No family at bedside, pt apparently full code per SNF report.   Assessment and Plan: Principal Problem:   Acute encephalopathy - appears to be multi factorial and secondary to dehydration and poor oral intake, FTT, ? Alcohol use - will admit to tele bed - check alcohol. Level - also ask for UA to evaluate for an infectious source, CXR with no signs of PNA - hold Lasix and provide IVF - advance diet as pt able to tolerate     Chronic combined systolic and diastolic CHF - clinically dry on exam - hold Lasix and provide gentle hydration avoiding volume overload  - please note that last 2 D ECHO done 07/2014 and no need for repeat at this time - EF 25 - 30% and grade II diastolic CHF noted on that 2 D ECHO - monitor daily weights, strict I/O - weight on admission 165 lbs  Active Problems:   Acute on chronic kidney failure - holding lasix and providing IVF as noted above - repeat BMP in AM    Elevated transaminase level - ? Alcohol use - alcohol level pending at this time  - place on CIWA protocol,  MVI/THiamine/Folate  - repeat CMET in AM    FTT - will need PT evaluation once more medically stable     Nutritional assessment - consult requested   Radiological Exams on Admission: Ct Head Wo Contrast 09/22/2014   No acute finding.  Atrophy and chronic microvascular ischemic change.     Dg Chest Port 1 View 09/22/2014  No edema or consolidation.    Code Status: Full Family Communication: Pt at bedside Disposition Plan: Admit for further evaluation    [facility] [phone]   Review of Systems:  Unable to obtain history due to altered mental status   Past Medical History  Diagnosis Date  . Gout   . Hypertension   . CHF (congestive heart failure)   . GERD (gastroesophageal reflux disease)     Social History:  reports that he has quit smoking. He does not have any smokeless tobacco history on file. He reports that he drinks alcohol. He reports that he does not use illicit drugs.  No Known Allergies   Unable to obtain family medical history due to altered mental status.   Medication Sig  aspirin EC 81 MG EC tablet Take 1 tablet (81 mg total) by mouth daily.  carvedilol (COREG) 3.125 MG tablet Take 1 tablet 2 (two) times daily with a meal.  colchicine 0.6 MG tablet Take 1 tablet 2 (two) times daily.  famotidine (PEPCID) 20 MG  tablet Take 20 mg by mouth 2 (two) times daily.  folic acid (FOLVITE) 1 MG tablet Take 1 tablet (1 mg total) by mouth daily.  furosemide (LASIX) 40 MG tablet Take 1 tablet by mouth 2 (two) times daily.  gabapentin (NEURONTIN) 100 MG capsule Take 1 capsule  2 (two) times daily.  isosorbide-hydrALAZINE (BIDIL)  Take 1 tablet by mouth 2 (two) times daily.  levothyroxine 50 MCG tablet Take 50 mcg by mouth daily before breakfast.  loperamide (IMODIUM) 2 MG capsule Take 2 mg as needed for diarrhea  sertraline (ZOLOFT) 25 MG tablet Take 12.5 mg by mouth daily.  tamsulosin 0.4 MG CAPS capsule Take 1 capsule  daily after supper.  pantoprazole (PROTONIX)  40 MG tablet Take 1 tablet (40 mg total) by mouth daily    Physical Exam: Filed Vitals:   09/22/14 0948 09/22/14 1109  BP: 130/59 153/58  Pulse: 67 61  Temp: 97.9 F (36.6 C)   TempSrc: Rectal   Resp: 18 18  SpO2: 97% 99%    Physical Exam  Constitutional: Appears confused  HENT: Normocephalic. External right and left ear normal. Dry MM Eyes: Conjunctivae and EOM are normal. PERRLA, no scleral icterus.  Neck: Normal ROM. Neck supple. No JVD. No tracheal deviation. No thyromegaly.  CVS: RRR, S1/S2 +, no carotid bruit.  Pulmonary: Effort and breath sounds normal, no stridor, diminished breath sounds at bases  Abdominal: Soft. BS +,  no distension, tenderness, rebound or guarding.  Musculoskeletal: Normal range of motion. Lymphadenopathy: No lymphadenopathy noted, cervical, inguinal. Neuro: Confused but moving all 4 extremities, not oriented to name or place and not able to follow any commands  Skin: Skin is warm and dry. No rash noted. Not diaphoretic. No erythema. No pallor.  Psychiatric: Difficult to assess due to AMS  Labs on Admission:  Basic Metabolic Panel:  Recent Labs Lab 09/22/14 0958  NA 144  K 4.4  CL 107  CO2 21*  GLUCOSE 107*  BUN 128*  CREATININE 3.32*  CALCIUM 8.1*   Liver Function Tests:  Recent Labs Lab 09/22/14 0958  AST 90*  ALT 101*  ALKPHOS 144*  BILITOT 1.1  PROT 6.6  ALBUMIN 3.0*   CBC:  Recent Labs Lab 09/22/14 0958  WBC 5.0  NEUTROABS 4.2  HGB 16.2  HCT 47.8  MCV 87.1  PLT 176   EKG: pending   If 7PM-7AM, please contact night-coverage [email or web address] Password [email or web address] 09/22/2014, 11:43 AM

## 2014-09-22 NOTE — ED Provider Notes (Signed)
CSN: [id number]     Arrival date & time 09/22/14  0901 History   First MD Initiated Contact with Patient 09/22/14 [phone]     Chief Complaint  Patient presents with  . Altered Mental Status  . Failure To Thrive    HPI Patient presents from his nursing facility with concerns of altered mental status. Level V caveat secondary to change in cognition. Per staff, patient has had recent confusion, decreased interactivity, decreased appetite. The patient himself cannot offer any details of the history, offers brief verbal response, without clearly, without appropriateness.   Past Medical History  Diagnosis Date  . Gout   . Hypertension   . CHF (congestive heart failure)   . GERD (gastroesophageal reflux disease)    History reviewed. No pertinent past surgical history. History reviewed. No pertinent family history. History  Substance Use Topics  . Smoking status: Former Smoker -- 40 years  . Smokeless tobacco: Not on file     Comment: Quit 20 years ago as of 2016  . Alcohol Use: 0.0 oz/week    0 Standard drinks or equivalent per week     Comment: today- "a double shot" (occasional use)    Review of Systems  Unable to perform ROS: Mental status change      Allergies  Review of patient's allergies indicates no known allergies.  Home Medications   Prior to Admission medications   Medication Sig Start Date End Date Taking? Authorizing Provider  aspirin EC 81 MG EC tablet Take 1 tablet (81 mg total) by mouth daily. 12/01/12  Yes Barton Dubois, MD  carvedilol (COREG) 3.125 MG tablet Take 1 tablet (3.125 mg total) by mouth 2 (two) times daily with a meal. 08/05/14  Yes Nishant Dhungel, MD  Cholecalciferol (VITAMIN D3) 50000 UNITS TABS Take 1 tablet by mouth once a week.   Yes Historical Provider, MD  colchicine 0.6 MG tablet Take 1 tablet (0.6 mg total) by mouth 2 (two) times daily. 05/02/14  Yes Lauree Chandler, NP  famotidine (PEPCID) 20 MG tablet Take 20 mg by mouth 2 (two) times  daily.   Yes Historical Provider, MD  folic acid (FOLVITE) 1 MG tablet Take 1 tablet (1 mg total) by mouth daily. 02/16/14  Yes Hosie Poisson, MD  furosemide (LASIX) 40 MG tablet Take 1 tablet (40 mg total) by mouth 2 (two) times daily. Patient taking differently: Take 40 mg by mouth daily.  08/05/14  Yes Nishant Dhungel, MD  gabapentin (NEURONTIN) 100 MG capsule Take 1 capsule (100 mg total) by mouth 2 (two) times daily. 06/10/14  Yes Monica Carter, DO  isosorbide-hydrALAZINE (BIDIL) 20-37.5 MG per tablet Take 1 tablet by mouth 2 (two) times daily. 08/05/14  Yes Nishant Dhungel, MD  levothyroxine (SYNTHROID, LEVOTHROID) 50 MCG tablet Take 50 mcg by mouth daily before breakfast.   Yes Historical Provider, MD  loperamide (IMODIUM) 2 MG capsule Take 2 mg by mouth as needed for diarrhea or loose stools.   Yes Historical Provider, MD  Multiple Vitamin (MULTIVITAMIN WITH MINERALS) TABS tablet Take 1 tablet by mouth daily. 02/13/14  Yes Hosie Poisson, MD  sertraline (ZOLOFT) 25 MG tablet Take 12.5 mg by mouth daily.   Yes Historical Provider, MD  tamsulosin (FLOMAX) 0.4 MG CAPS capsule Take 1 capsule (0.4 mg total) by mouth daily after supper. 12/01/12  Yes Barton Dubois, MD  traMADol (ULTRAM) 50 MG tablet Take 1 tablet (50 mg total) by mouth 2 (two) times daily as needed. 09/14/14  Yes Alexa  Sherral Hammers, MD  pantoprazole (PROTONIX) 40 MG tablet Take 1 tablet (40 mg total) by mouth daily at 12 noon. Patient not taking: Reported on 09/22/2014 12/01/12   Barton Dubois, MD   BP 153/58 mmHg  Pulse 61  Temp(Src) 97.9 F (36.6 C) (Rectal)  Resp 18  SpO2 99% Physical Exam  Constitutional: He is oriented to person, place, and time. He appears cachectic. He appears ill. No distress.  HENT:  Head: Normocephalic and atraumatic.    Mouth/Throat: Mucous membranes are pale and dry.  Eyes: Conjunctivae and EOM are normal.  Cardiovascular: Normal rate and regular rhythm.   Pulmonary/Chest: Effort normal. No stridor.  No respiratory distress.  Abdominal: He exhibits no distension.  Musculoskeletal: He exhibits no edema.  Neurological: He is oriented to person, place, and time.  Minimal movement of all extremities, does not follow commands  Skin: Skin is warm and dry.  Psychiatric: Cognition and memory are impaired.  Nursing note and vitals reviewed.   ED Course  Procedures (including critical care time) Labs Review Labs Reviewed  COMPREHENSIVE METABOLIC PANEL - Abnormal; Notable for the following:    CO2 21 (*)    Glucose, Bld 107 (*)    BUN 128 (*)    Creatinine, Ser 3.32 (*)    Calcium 8.1 (*)    Albumin 3.0 (*)    AST 90 (*)    ALT 101 (*)    Alkaline Phosphatase 144 (*)    GFR calc non Af Amer 15 (*)    GFR calc Af Amer 18 (*)    Anion gap 16 (*)    All other components within normal limits  CBC WITH DIFFERENTIAL/PLATELET - Abnormal; Notable for the following:    RDW 16.1 (*)    Neutrophils Relative % 84 (*)    Lymphocytes Relative 10 (*)    Lymphs Abs 0.5 (*)    All other components within normal limits  URINALYSIS, ROUTINE W REFLEX MICROSCOPIC (NOT AT [facility])  I-STAT CG4 LACTIC ACID, ED    Imaging Review Ct Head Wo Contrast  09/22/2014   CLINICAL DATA:  Abnormal speech today.  Initial encounter.  EXAM: CT HEAD WITHOUT CONTRAST  TECHNIQUE: Contiguous axial images were obtained from the base of the skull through the vertex without intravenous contrast.  COMPARISON:  Head CT scan 05/11/2014.  FINDINGS: Atrophy and chronic microvascular ischemic change are identified as on the prior examination. There is no evidence of acute intracranial abnormality including hemorrhage, infarct, mass lesion, mass effect, midline shift or abnormal extra-axial fluid collection. No hydrocephalus or pneumocephalus. The calvarium is intact.  IMPRESSION: No acute finding.  Atrophy and chronic microvascular ischemic change.   Electronically Signed   By: Inge Rise M.D.   On: 09/22/2014 10:41   Dg Chest  Port 1 View  09/22/2014   CLINICAL DATA:  Altered mental status  EXAM: PORTABLE CHEST - 1 VIEW  COMPARISON:  Jul 30, 2014  FINDINGS: There is no edema or consolidation. The heart size and pulmonary vascularity are normal. No appreciable adenopathy. No bone lesions.  IMPRESSION: No edema or consolidation.   Electronically Signed   By: Lowella Grip III M.D.   On: 09/22/2014 09:49     EKG Interpretation   Date/Time:  Tuesday September 22 2014 09:51:03 EDT Ventricular Rate:  67 PR Interval:  164 QRS Duration: 151 QT Interval:  488 QTC Calculation: 515 R Axis:   34 Text Interpretation:  Sinus rhythm Right bundle branch block Sinus rhythm  Non-specific intra-ventricular conduction delay Artifact Abnormal ekg  Confirmed by Carmin Muskrat  MD [phone]) on 09/22/2014 10:24:27 AM        Chart review demonstrates visit several days ago, for similar concerns, but also with fall. Pulse ox 96% room air normal Cardiac 75 sinus normal  On repeat exam the patient appears in similar condition. Patient has had one episode of loose diarrhea. Patient seemingly unaware of this. He remains encephalopathic.  Patient received fluid resuscitation on arrival given clinical right status. Patient's labs notable for elevated creatinine, acute kidney failure.  11:22 AM Patient remains encephalopathic, not answer questions appropriately, not interacting appropriately.  MDM  Patient presents from his nursing facility with concern of altered mental status. Here the patient is noninteractive, and clinically is grossly dehydrated on the initial exam. With concern for dehydration versus acute kidney injury versus infectious versus neurologic phenomena, patient had broad differential considered. Patient's evaluation was notable for acute kidney failure, with doubling of his creatinine in one week Patient received IV fluid resuscitation on empirically, continue to receive IV fluids during his emergency department  stay. The acute kidney injury, patient was admitted for further evaluation and management.  Carmin Muskrat, MD 09/22/14 1124

## 2014-09-22 NOTE — ED Notes (Signed)
Attempted report 

## 2014-09-23 DIAGNOSIS — N179 Acute kidney failure, unspecified: Principal | ICD-10-CM

## 2014-09-23 DIAGNOSIS — N189 Chronic kidney disease, unspecified: Secondary | ICD-10-CM

## 2014-09-23 DIAGNOSIS — R740 Nonspecific elevation of levels of transaminase and lactic acid dehydrogenase [LDH]: Secondary | ICD-10-CM

## 2014-09-23 DIAGNOSIS — N390 Urinary tract infection, site not specified: Secondary | ICD-10-CM | POA: Diagnosis present

## 2014-09-23 DIAGNOSIS — B370 Candidal stomatitis: Secondary | ICD-10-CM | POA: Diagnosis present

## 2014-09-23 DIAGNOSIS — E43 Unspecified severe protein-calorie malnutrition: Secondary | ICD-10-CM

## 2014-09-23 DIAGNOSIS — N3000 Acute cystitis without hematuria: Secondary | ICD-10-CM

## 2014-09-23 DIAGNOSIS — G9340 Encephalopathy, unspecified: Secondary | ICD-10-CM

## 2014-09-23 LAB — COMPREHENSIVE METABOLIC PANEL
ALT: 93 U/L — AB (ref 17–63)
AST: 82 U/L — AB (ref 15–41)
Albumin: 2.7 g/dL — ABNORMAL LOW (ref 3.5–5.0)
Alkaline Phosphatase: 128 U/L — ABNORMAL HIGH (ref 38–126)
Anion gap: 14 (ref 5–15)
BUN: 106 mg/dL — ABNORMAL HIGH (ref 6–20)
CALCIUM: 7.9 mg/dL — AB (ref 8.9–10.3)
CO2: 19 mmol/L — AB (ref 22–32)
Chloride: 116 mmol/L — ABNORMAL HIGH (ref 101–111)
Creatinine, Ser: 2.49 mg/dL — ABNORMAL HIGH (ref 0.61–1.24)
GFR calc non Af Amer: 22 mL/min — ABNORMAL LOW (ref >60)
GFR, EST AFRICAN AMERICAN: 25 mL/min — AB (ref >60)
Glucose, Bld: 81 mg/dL (ref 65–99)
Potassium: 3.9 mmol/L (ref 3.5–5.1)
SODIUM: 149 mmol/L — AB (ref 135–145)
Total Bilirubin: 1.4 mg/dL — ABNORMAL HIGH (ref 0.3–1.2)
Total Protein: 6 g/dL — ABNORMAL LOW (ref 6.5–8.1)

## 2014-09-23 LAB — CBC
HCT: 43.5 % (ref 39.0–52.0)
Hemoglobin: 14.4 g/dL (ref 13.0–17.0)
MCH: 29 pg (ref 26.0–34.0)
MCHC: 33.1 g/dL (ref 30.0–36.0)
MCV: 87.7 fL (ref 78.0–100.0)
Platelets: 182 10*3/uL (ref 150–400)
RBC: 4.96 MIL/uL (ref 4.22–5.81)
RDW: 16.1 % — AB (ref 11.5–15.5)
WBC: 3.7 10*3/uL — AB (ref 4.0–10.5)

## 2014-09-23 LAB — HEMOGLOBIN A1C
HEMOGLOBIN A1C: 6.5 % — AB (ref 4.8–5.6)
Mean Plasma Glucose: 140 mg/dL

## 2014-09-23 LAB — CLOSTRIDIUM DIFFICILE BY PCR: CDIFFPCR: NEGATIVE

## 2014-09-23 MED ORDER — CEFTRIAXONE SODIUM IN DEXTROSE 20 MG/ML IV SOLN
1.0000 g | INTRAVENOUS | Status: DC
  Administered 2014-09-23 – 2014-09-24 (×2): 1 g via INTRAVENOUS
  Filled 2014-09-23 (×2): qty 50

## 2014-09-23 MED ORDER — COLCHICINE 0.6 MG PO TABS
0.3000 mg | ORAL_TABLET | Freq: Every day | ORAL | Status: DC
  Administered 2014-09-23 – 2014-09-24 (×2): 0.3 mg via ORAL
  Filled 2014-09-23 (×2): qty 0.5

## 2014-09-23 MED ORDER — SODIUM CHLORIDE 0.9 % IV SOLN
INTRAVENOUS | Status: DC
  Administered 2014-09-23 – 2014-09-24 (×3): via INTRAVENOUS

## 2014-09-23 MED ORDER — FLUCONAZOLE 100MG IVPB
100.0000 mg | INTRAVENOUS | Status: DC
  Administered 2014-09-23 – 2014-09-24 (×2): 100 mg via INTRAVENOUS
  Filled 2014-09-23 (×4): qty 50

## 2014-09-23 MED ORDER — ENSURE ENLIVE PO LIQD: 237.0000 mL | Freq: Two times a day (BID) | ORAL | Status: DC

## 2014-09-23 NOTE — Progress Notes (Signed)
1818 placed a call and had spoken with Coralyn Mark, staff from [facility] updated pt's status

## 2014-09-23 NOTE — Evaluation (Signed)
Physical Therapy Evaluation Patient Details Name: Jesse Allen MRN: [id number] DOB: 11-Jun-1925 Today's Date: 09/23/2014   History of Present Illness  Pt presents from ALF with h/o HTN, gout, GERD, and CHF, now with AMS and increasing lethargy as well as poor intake. Found to have UTI.  Clinical Impression  Pt admitted with above diagnosis. Pt currently with functional limitations due to the deficits listed below (see PT Problem List). Pt lethargic on eval with minimal participation with bed mobility and inability to transfer or ambulate.   Pt will benefit from skilled PT to increase their independence and safety with mobility to allow discharge to the venue listed below.       Follow Up Recommendations SNF;Supervision/Assistance - 24 hour    Equipment Recommendations  Other (comment) (TBD)    Recommendations for Other Services       Precautions / Restrictions Precautions Precautions: Fall Restrictions Weight Bearing Restrictions: No      Mobility  Bed Mobility Overal bed mobility: Needs Assistance Bed Mobility: Supine to Sit;Sit to Supine     Supine to sit: Max assist Sit to supine: Max assist   General bed mobility comments: pt somewhat resistant to mobility and at times trying to assist. Required max A to roll and elevate trunk from bed as well as slide hips to EOB. Lowered self back down to bed but required max A for legs into bed  Transfers                 General transfer comment: unable to safely perform with one person assist  Ambulation/Gait             General Gait Details: unable  Stairs            Wheelchair Mobility    Modified Rankin (Stroke Patients Only)       Balance Overall balance assessment: Needs assistance Sitting-balance support: Feet supported Sitting balance-Leahy Scale: Poor Sitting balance - Comments: required mod A to maintain sitting EOB Postural control: Right lateral lean;Posterior lean                                   Pertinent Vitals/Pain Pain Assessment: No/denies pain    Home Living Family/patient expects to be discharged to:: Assisted living               Home Equipment: Walker - 2 wheels;Wheelchair - manual Additional Comments: all info obtained from chart, pt could not give any history    Prior Function Level of Independence: Needs assistance   Gait / Transfers Assistance Needed: per RN, pt used w/c for primary mobility and was transferring           Hand Dominance        Extremity/Trunk Assessment   Upper Extremity Assessment: Generalized weakness;Difficult to assess due to impaired cognition           Lower Extremity Assessment: Generalized weakness;Difficult to assess due to impaired cognition      Cervical / Trunk Assessment: Kyphotic  Communication   Communication: Expressive difficulties  Cognition Arousal/Alertness: Lethargic Behavior During Therapy: Flat affect Overall Cognitive Status: Impaired/Different from baseline Area of Impairment: Orientation;Attention;Following commands;Awareness;Problem solving Orientation Level: Disoriented to;Time;Situation;Place Current Attention Level: Focused Memory: Decreased short-term memory Following Commands: Follows one step commands inconsistently   Awareness: Intellectual Problem Solving: Decreased initiation;Slow processing;Requires verbal cues;Requires tactile cues General Comments: pt very slow to respond and with decreased  verbalization throughout session. Followed simple commands 25% ot time    General Comments General comments (skin integrity, edema, etc.): pt confused on eval and with minimal participation    Exercises        Assessment/Plan    PT Assessment Patient needs continued PT services  PT Diagnosis Generalized weakness;Altered mental status   PT Problem List Decreased strength;Decreased range of motion;Decreased activity tolerance;Decreased balance;Decreased  mobility;Decreased coordination;Decreased cognition;Decreased safety awareness;Decreased knowledge of precautions  PT Treatment Interventions DME instruction;Gait training;Functional mobility training;Therapeutic activities;Therapeutic exercise;Balance training;Neuromuscular re-education;Cognitive remediation;Patient/family education   PT Goals (Current goals can be found in the Care Plan section) Acute Rehab PT Goals Patient Stated Goal: none stated PT Goal Formulation: Patient unable to participate in goal setting Time For Goal Achievement: 10/07/14 Potential to Achieve Goals: Fair    Frequency Min 2X/week   Barriers to discharge Decreased caregiver support apparently came from ALF setting per RN    Co-evaluation               End of Session   Activity Tolerance: Patient limited by lethargy;Patient limited by fatigue Patient left: in bed;with bed alarm set;with call bell/phone within reach Nurse Communication: Mobility status         Time: 4917-9150 PT Time Calculation (min) (ACUTE ONLY): 15 min   Charges:   PT Evaluation $Initial PT Evaluation Tier I: 1 Procedure     PT G Codes:      Leighton Roach, PT  Acute Rehab Services  [place], [place] 09/23/2014, 4:32 PM

## 2014-09-23 NOTE — Evaluation (Signed)
Clinical/Bedside Swallow Evaluation Patient Details  Name: KAYEN GRABEL MRN: [id number] Date of Birth: [birth year]  Today's Date: 09/23/2014 Time: SLP Start Time (ACUTE ONLY): 1140 SLP Stop Time (ACUTE ONLY): 1200 SLP Time Calculation (min) (ACUTE ONLY): 20 min  Past Medical History:  Past Medical History  Diagnosis Date  . Gout   . Hypertension   . CHF (congestive heart failure)   . GERD (gastroesophageal reflux disease)    Past Surgical History: History reviewed. No pertinent past surgical history. HPI:  Pt is 81 yo male, resident of SNF, presented to [facility] ED from SNF for evaluation of 1-2 days duration of progressive lethargy, poor oral intake. In ED, pt visibly altered and confused, unable to provide any history but not in acute distress.    Assessment / Plan / Recommendation Clinical Impression   Pt presents with s/s of a mild dysphagia that appears to be primarily cognitive in nature.  Pt was lethargic and confused throughout evaluation and required cues to attend to boluses.  Pt presented with suspected delayed swallow initiation across all boluses but was noted with what appeared to be adequate hyolaryngeal excursion per palpation.  Pt exhibited no overt s/s of aspiration with thin liquids via straw, although pt's vocal quality was difficult to consistently assess due to pt's inability to follow commands.  Pt was noted with soft delayed throat clear following purees and solid boluses which is likely related to delayed swallow initiation.  Pt was left with mild diffuse residual solids post swallow which cleared with manual assist and cues for liquid wash.  Given pt's lethargy, recommend downgrading pt to dys 2 textures with continued thin liquids and full supervision for use of the following swallowing precautions (upright as possible during meals, oral care following meal, small bites/sips, minimize distractions, alternate solids, and liquids).  Prognosis for advancement good per improved  mentation.      Aspiration Risk  Mild    Diet Recommendation Dysphagia 2 (Fine chop);Thin   Medication Administration: Crushed with puree Compensations: Minimize environmental distractions;Small sips/bites;Slow rate;Follow solids with liquid    Other  Recommendations Oral Care Recommendations: Oral care QID   Follow Up Recommendations  Skilled Nursing facility;Other (comment) (TBD pending progress made while inpatient )    Frequency and Duration min 2x/week  2 weeks        Swallow Study    General Date of Onset: 09/22/14 Other Pertinent Information: Pt is 81 yo male, resident of SNF, presented to [facility] ED from SNF for evaluation of 1-2 days duration of progressive lethargy, poor oral intake. In ED, pt visibly altered and confused, unable to provide any history but not in acute distress.  Type of Study: Bedside swallow evaluation Previous Swallow Assessment: n/a Diet Prior to this Study: Dysphagia 3 (soft);Thin liquids Temperature Spikes Noted: No Respiratory Status: Room air History of Recent Intubation: No Behavior/Cognition: Confused;Lethargic/Drowsy;Requires cueing Oral Cavity - Dentition: Missing dentition (no bottom teeth ) Self-Feeding Abilities: Needs assist Patient Positioning: Upright in bed Baseline Vocal Quality: Low vocal intensity Volitional Cough: Cognitively unable to elicit    Oral/Motor/Sensory Function Overall Oral Motor/Sensory Function: Other (comment) (difficult to assess due to cognition )      Thin Liquid Thin Liquid: Impaired Presentation: Straw Oral Phase Impairments: Poor awareness of bolus;Impaired anterior to posterior transit;Reduced labial seal Pharyngeal  Phase Impairments: Suspected delayed Swallow;Multiple swallows          Puree Puree: Impaired Presentation: Spoon Oral Phase Impairments: Poor awareness of bolus;Impaired anterior to posterior  transit;Reduced lingual movement/coordination Oral Phase Functional Implications: Oral  residue;Oral holding Pharyngeal Phase Impairments: Throat Clearing - Delayed;Suspected delayed Swallow;Multiple swallows   Solid   GO    Solid: Impaired Oral Phase Impairments: Poor awareness of bolus;Reduced lingual movement/coordination;Impaired anterior to posterior transit;Impaired mastication Oral Phase Functional Implications: Oral holding Pharyngeal Phase Impairments: Throat Clearing - Immediate;Wet Vocal Quality;Suspected delayed Swallow       Harsh Trulock, Selinda Orion 09/23/2014,12:21 PM

## 2014-09-23 NOTE — Progress Notes (Signed)
[facility] in . Spoken with Social  Worker, Freight forwarder   Dr. Coralyn Pear also spoke with Ms caldwell. Copy fot POA will be in tom

## 2014-09-23 NOTE — Progress Notes (Signed)
Initial Nutrition Assessment  DOCUMENTATION CODES:  Severe malnutrition in context of chronic illness  INTERVENTION:  Ensure Enlive (each supplement provides 350kcal and 20 grams of protein), Magic cup, MVI  NUTRITION DIAGNOSIS:  Inadequate oral intake related to lethargy/confusion, other (see comment) as evidenced by severe depletion of muscle mass, percent weight loss.  GOAL:  Patient will meet greater than or equal to 90% of their needs    MONITOR:  PO intake, Supplement acceptance, Labs, Weight trends, Skin  REASON FOR ASSESSMENT:  Consult Assessment of nutrition requirement/status  ASSESSMENT: 81 yo male, resident of SNF, with history of CHF, presented to [facility] ED from SNF for evaluation of 1-2 days duration of progressive lethargy, poor oral intake.  Per weight history pt has lost 23% of his body weight within the past 4-5 months. Pt minimally responsive at time of visit. Limited physical exam with evidence of severe muscle wasting in legs. Per nurse tech, pt would not swallow food this morning. Pt has since been downgraded to a dysphagia 2 diet. No nutritional supplements listed on PTA medication list.   Labs: high phosphorus, high magnesium, low GFR, elevated creatinine/BUN, low calcium, elevated AST/ALT  Height:  Ht Readings from Last 1 Encounters:  09/22/14 5\' 10"  (1.778 m)    Weight:  Wt Readings from Last 1 Encounters:  09/23/14 153 lb 7 oz (69.6 kg)    Ideal Body Weight:  75.5 kg  Wt Readings from Last 10 Encounters:  09/23/14 153 lb 7 oz (69.6 kg)  09/14/14 165 lb (74.844 kg)  08/28/14 174 lb (78.926 kg)  08/05/14 183 lb 3.2 oz (83.099 kg)  06/10/14 210 lb (95.255 kg)  05/06/14 200 lb 9.6 oz (90.992 kg)  02/16/14 243 lb 6.4 oz (110.406 kg)  11/29/12 215 lb (97.523 kg)    BMI:  Body mass index is 22.02 kg/(m^2).  Estimated Nutritional Needs:  Kcal:  1800-2000  Protein:  85-95 grams  Fluid:  1.8-2 L/day  Skin:  Reviewed, no issues  Diet  Order:  DIET DYS 2 Room service appropriate?: Yes; Fluid consistency:: Thin  EDUCATION NEEDS:  No education needs identified at this time   Intake/Output Summary (Last 24 hours) at 09/23/14 1238 Last data filed at 09/23/14 0900  Gross per 24 hour  Intake 506.67 ml  Output   1475 ml  Net -968.33 ml    Last BM:  PTA  Pryor Ochoa RD, LDN Inpatient Clinical Dietitian Pager: [phone] After Hours Pager: [phone]

## 2014-09-23 NOTE — Progress Notes (Signed)
[facility] PROGRESS NOTE  Jesse Allen FAO:[id number] DOB: 1925-05-14 DOA: 09/22/2014 PCP: Gildardo Cranker, DO  HPI/Subjective:  Jesse Allen is an 81 y.o. male with PMH significant for alcohol abuse, CHF, hypertension, non-ischemic cardiomyopathy and pre-diabetes who presented to the ED from a SNF for eval of 1-2 days of progressive lethargy and poor oral intake with new onset AMS at time of admission. In ED visibly altered and unable to provide history, labs showed Cr 3.32 and elevated transaminase.  Admitting provider ordered UA, it indicates UTI, which is likely explanatory of the patient's current mental state. Today he is intermittently alert, can state own name but not where he is or what year it is, can follow commands during lucid moments.  Assessment/Plan:  Principal Problem:   Acute encephalopathy Active Problems:   Elevated transaminase level   Alcohol abuse   CHF (congestive heart failure)   Acute on chronic kidney failure   Oral candidiasis   UTI (urinary tract infection)   Severe protein-calorie malnutrition  Acute encephalopathy - Likely due to combination of UTI and dehydration, head CT negative for acute process. Patient is intermittently able to converse and follow commands, he is not oriented to place or time but knows his own name. Began ceftriaxone 1 g IV daily this AM. Continue IV fluids, monitoring and supportive care.  UTI - UA showed leukocytes. This is probably the cause of patient's AMS. Started ceftriaxone this AM.  Severe malnutrition -  Patient's family indicates he has had a decreased appetite and rapid decline since entering SNF. He has had a worsened poor oral intake x 2 weeks. This morning no breakfast eaten and had difficulty swallowing orange juice.  Speech evaluation recommends switch to dysphagia 2 diet with thin liquids. Nutrition recommends adding Ensure Enlive (each supplement provides 350kcal and 20 grams of protein), Magic cup, MVI.  Elevated  transaminase level - Recurrent, likely due to long term alcohol abuse/cirrhosis. Current exacerbation likely due to dehydration/infection. AST/ALT have decreased since admission. Continue monitoring CMP.  Acute on chronic kidney failure - Cr 3.32 on admission down to 2.49 today. Exacerbation due to infection and poor oral intake/dehydration. Slowly replete fluids so as not to overload. Decreased colchicine dose to accommodate.  Oral Candidiasis - Patient has white plaque covering most of tongue indicative of thrush, this is likely due to severe malnutrition. Began fluconazole 100 mg IV at 50 ml/hr daily this AM.  Chronic CHF - Currently asymptomatic, no swelling or JVD on exam. Will continue home meds and gentle fluid resuscitation.  Alcohol abuse - Historical problem. Serum levels <5.    Code Status: DNR Family Communication: Daughter-in-law/HPOA at bedside, goals of care discussed. Patient QOL has decreased significantly since entering SNF. Disposition Plan: Discharge to SNF when ready.   Consultants:  Speech/Swallowing Consult  Procedures:  None  Antibiotics:  Ceftriaxone 09/23/2014   fluconazole 09/23/2014   Objective: Filed Vitals:   09/23/14 0612  BP: 149/57  Pulse: 76  Temp: 98.6 F (37 C)  Resp: 17    Intake/Output Summary (Last 24 hours) at 09/23/14 1206 Last data filed at 09/23/14 0900  Gross per 24 hour  Intake 506.67 ml  Output   1475 ml  Net -968.33 ml   Filed Weights   09/22/14 1342 09/23/14 0612  Weight: 65.8 kg (145 lb 1 oz) 69.6 kg (153 lb 7 oz)    Exam:  Constitutional: Appears lethargic, lying in bed in confused state.  HENT: Normocephalic. External right and left ear normal.  Thrush apparent on tongue. Eyes: Conjunctivae and EOM are normal. PERRLA, no scleral icterus.  Neck: Normal ROM. Neck supple. No JVD. No tracheal deviation. No thyromegaly.  CVS: RRR, S1/S2 +, no carotid bruit.  Pulmonary: No stridor, mild crackles with diminished  breath sounds at bases  Abdominal: Soft. BS +, no distension, tenderness, rebound or guarding, however, patient asked me to stop during exam without explanation Musculoskeletal: Normal range of motion.  Lymphadenopathy: No lymphadenopathy noted, cervical, inguinal. Neuro: Confused but moving all 4 extremities, not oriented to time or place, knows own name and can follow commands intermittently. Skin: Skin is warm and dry. No rash noted. Small lesions healing on both knees.    Data Reviewed: Basic Metabolic Panel:  Recent Labs Lab 09/22/14 0958 09/22/14 1636 09/23/14 0232  NA 144  --  149*  K 4.4  --  3.9  CL 107  --  116*  CO2 21*  --  19*  GLUCOSE 107*  --  81  BUN 128*  --  106*  CREATININE 3.32* 2.90* 2.49*  CALCIUM 8.1*  --  7.9*  MG  --  2.8*  --   PHOS  --  6.0*  --    Liver Function Tests:  Recent Labs Lab 09/22/14 0958 09/23/14 0232  AST 90* 82*  ALT 101* 93*  ALKPHOS 144* 128*  BILITOT 1.1 1.4*  PROT 6.6 6.0*  ALBUMIN 3.0* 2.7*   No results for input(s): LIPASE, AMYLASE in the last 168 hours. No results for input(s): AMMONIA in the last 168 hours. CBC:  Recent Labs Lab 09/22/14 0958 09/22/14 1636 09/23/14 0232  WBC 5.0 3.6* 3.7*  NEUTROABS 4.2  --   --   HGB 16.2 15.7 14.4  HCT 47.8 47.5 43.5  MCV 87.1 87.2 87.7  PLT 176 123* 182   Cardiac Enzymes: No results for input(s): CKTOTAL, CKMB, CKMBINDEX, TROPONINI in the last 168 hours. BNP (last 3 results)  Recent Labs  07/30/14 1200 08/03/14 0438  BNP 2824.4* 937.4*    ProBNP (last 3 results)  Recent Labs  02/08/14 1655 02/13/14 0405  PROBNP 62377.0* 23222.0*    CBG: No results for input(s): GLUCAP in the last 168 hours.  Recent Results (from the past 240 hour(s))  Urine culture     Status: None (Preliminary result)   Collection Time: 09/22/14  6:17 PM  Result Value Ref Range Status   Specimen Description URINE, RANDOM  Final   Special Requests NONE  Final   Culture CULTURE  REINCUBATED FOR BETTER GROWTH  Final   Report Status PENDING  Incomplete  MRSA PCR Screening     Status: None   Collection Time: 09/22/14  6:28 PM  Result Value Ref Range Status   MRSA by PCR NEGATIVE NEGATIVE Final    Comment:        The GeneXpert MRSA Assay (FDA approved for NASAL specimens only), is one component of a comprehensive MRSA colonization surveillance program. It is not intended to diagnose MRSA infection nor to guide or monitor treatment for MRSA infections.      Studies: Ct Head Wo Contrast  09/22/2014   CLINICAL DATA:  Abnormal speech today.  Initial encounter.  EXAM: CT HEAD WITHOUT CONTRAST  TECHNIQUE: Contiguous axial images were obtained from the base of the skull through the vertex without intravenous contrast.  COMPARISON:  Head CT scan 05/11/2014.  FINDINGS: Atrophy and chronic microvascular ischemic change are identified as on the prior examination. There is no evidence of acute  intracranial abnormality including hemorrhage, infarct, mass lesion, mass effect, midline shift or abnormal extra-axial fluid collection. No hydrocephalus or pneumocephalus. The calvarium is intact.  IMPRESSION: No acute finding.  Atrophy and chronic microvascular ischemic change.   Electronically Signed   By: Inge Rise M.D.   On: 09/22/2014 10:41   Dg Chest Port 1 View  09/22/2014   CLINICAL DATA:  Altered mental status  EXAM: PORTABLE CHEST - 1 VIEW  COMPARISON:  Jul 30, 2014  FINDINGS: There is no edema or consolidation. The heart size and pulmonary vascularity are normal. No appreciable adenopathy. No bone lesions.  IMPRESSION: No edema or consolidation.   Electronically Signed   By: Lowella Grip III M.D.   On: 09/22/2014 09:49    Scheduled Meds: . aspirin EC  81 mg Oral Daily  . carvedilol  3.125 mg Oral BID WC  . cefTRIAXone (ROCEPHIN)  IV  1 g Intravenous Q24H  . colchicine  0.6 mg Oral BID  . enoxaparin (LOVENOX) injection  30 mg Subcutaneous Q24H  . fluconazole  (DIFLUCAN) IV  100 mg Intravenous Q24H  . folic acid  1 mg Oral Daily  . folic acid  1 mg Intravenous Daily  . isosorbide-hydrALAZINE  1 tablet Oral BID  . levothyroxine  50 mcg Oral QAC breakfast  . multivitamin with minerals  1 tablet Oral Daily  . pantoprazole (PROTONIX) IV  40 mg Intravenous Q24H  . sertraline  12.5 mg Oral Daily  . sodium chloride  3 mL Intravenous Q12H  . tamsulosin  0.4 mg Oral QPC supper  . thiamine  100 mg Oral Daily   Or  . thiamine  100 mg Intravenous Daily   Continuous Infusions: . sodium chloride 50 mL/hr at 09/23/14 0830     Time spent: 35 minutes   Hornbeck, Edwena Blow PA-S  Attending MD note  Patient was seen, examined,treatment plan was discussed with the PA-S. I have personally reviewed the clinical findings, lab, imaging studies and management of this patient in detail. I agree with the documentation, as recorded PA-S. I personally saw and evaluated patient on 09/23/2014 and agree with above findings. He is an 81 year old gentleman with multiple comorbidities including hypertension, congestive heart failure, cognitive impairment, presented as a transfer from skilled nursing facility. Family reporting his had a functional decline over the past several months, with a steep decline in the last week. He has had decreased by mouth intake, becoming minimally active, presenting with acute encephalopathy. Lab work revealed the presence of acute kidney injury likely secondary to profound dehydration. Goals of care discussed with his healthcare power of attorney. She reports that patient has had a progressive decline over the last several months, particularly since transitioning to SNF. She feels that he has had a decrease will to live since making this transition. She would like to treat what is potentially reversible in providing IV fluids and IV antimicrobial therapy, however understands that his prognosis may be poor. She is interested in a Palliative Care  consultation to discuss further his goals of care. On exam he appears cachectic, encephalopathic, lethargic, he able to tell us his name however thought we were in [place]. Has significant thrush, significant bilateral muscle atrophy, he did not appear to have decubitus ulcers on skin exam, lungs clear to auscultation, cardiac exam within normal limits.      Jesse Cellar MD [facility] Pager [phone] 7PM-7AM, please contact night-coverage at [email or web address], password [facility] 09/23/2014, 12:06 PM  LOS: 1 day

## 2014-09-23 NOTE — Progress Notes (Signed)
UR completed 

## 2014-09-24 DIAGNOSIS — Z515 Encounter for palliative care: Secondary | ICD-10-CM

## 2014-09-24 DIAGNOSIS — Z7189 Other specified counseling: Secondary | ICD-10-CM

## 2014-09-24 DIAGNOSIS — R627 Adult failure to thrive: Secondary | ICD-10-CM

## 2014-09-24 LAB — CBC
HCT: 44.4 % (ref 39.0–52.0)
Hemoglobin: 14.7 g/dL (ref 13.0–17.0)
MCH: 29.1 pg (ref 26.0–34.0)
MCHC: 33.1 g/dL (ref 30.0–36.0)
MCV: 87.7 fL (ref 78.0–100.0)
Platelets: 177 10*3/uL (ref 150–400)
RBC: 5.06 MIL/uL (ref 4.22–5.81)
RDW: 16.4 % — ABNORMAL HIGH (ref 11.5–15.5)
WBC: 3 10*3/uL — ABNORMAL LOW (ref 4.0–10.5)

## 2014-09-24 LAB — BASIC METABOLIC PANEL
ANION GAP: 13 (ref 5–15)
BUN: 88 mg/dL — AB (ref 6–20)
CALCIUM: 7.8 mg/dL — AB (ref 8.9–10.3)
CO2: 19 mmol/L — ABNORMAL LOW (ref 22–32)
CREATININE: 2.01 mg/dL — AB (ref 0.61–1.24)
Chloride: 124 mmol/L — ABNORMAL HIGH (ref 101–111)
GFR, EST AFRICAN AMERICAN: 33 mL/min — AB (ref >60)
GFR, EST NON AFRICAN AMERICAN: 28 mL/min — AB (ref >60)
Glucose, Bld: 99 mg/dL (ref 65–99)
Potassium: 3.4 mmol/L — ABNORMAL LOW (ref 3.5–5.1)
Sodium: 156 mmol/L — ABNORMAL HIGH (ref 135–145)

## 2014-09-24 MED ORDER — MORPHINE SULFATE 2 MG/ML IJ SOLN
1.0000 mg | INTRAMUSCULAR | Status: DC | PRN
  Administered 2014-09-25: 1 mg via INTRAVENOUS
  Filled 2014-09-24 (×2): qty 1

## 2014-09-24 MED ORDER — LORAZEPAM 2 MG/ML IJ SOLN: 1.0000 mg | INTRAMUSCULAR | Status: DC | PRN

## 2014-09-24 NOTE — Progress Notes (Addendum)
Speech Language Pathology Treatment: Dysphagia  Patient Details Name: Jesse Allen MRN: [id number] DOB: [birth year] Today's Date: 09/24/2014 Time: 9449-6759 SLP Time Calculation (min) (ACUTE ONLY): 15 min  Assessment / Plan / Recommendation Clinical Impression  Pt was seen for dysphagia treatment.  Upon arrival, pt was slightly more alert in comparison to previous evaluation, pt was restless in bed and would call out loudly, although his speech remains unintelligible.  WIth positional changes, prior to PO intake pt's voice became wet, which SLP suspects to be related to decreased management of secretions/saliva.  Pt remains very confused today and per report has eaten very little since admitted.  He is afebrile and has poor oral hygiene characterized by xerostomia with white coating on tongue.  SLP attempted skilled observations with presentations of pt's currently prescribed diet; however, pt consumed only 1 teaspoon of pureed textures and refused all other offered PO trials. Pt required max assist multimodal cues and extra time to swallow small amount of PO due to oral holding and poor awareness of bolus.  Pt refused thin liquids.  Oral care provided to remove residual purees from oral cavity post swallow.  Recommend a downgrade to dys 1 textures due to oral holding with continued thin liquids and full supervision for swallowing safety.      HPI Other Pertinent Information: Pt is 81 yo male, resident of SNF, presented to [facility] ED from SNF for evaluation of 1-2 days duration of progressive lethargy, poor oral intake. In ED, pt visibly altered and confused, unable to provide any history but not in acute distress.    Pertinent Vitals Pain Assessment: Faces Faces Pain Scale: No hurt  SLP Plan  Continue with current plan of care    Recommendations Diet recommendations: Dysphagia 1 (puree);Thin liquid Liquids provided via: Cup Medication Administration: Crushed with puree Supervision: Full  supervision/cueing for compensatory strategies Compensations: Minimize environmental distractions;Small sips/bites;Slow rate;Follow solids with liquid Postural Changes and/or Swallow Maneuvers: Seated upright 90 degrees              Oral Care Recommendations: Oral care QID Follow up Recommendations: Skilled Nursing facility Plan: Continue with current plan of care    GO     Saamiya Jeppsen, Selinda Orion 09/24/2014, 8:40 AM

## 2014-09-24 NOTE — Progress Notes (Addendum)
CSW (Clinical Education officer, museum) spoke with pt step daughter Hoyle Sauer who confirmed she would like for pt to dc to residential hospice. CSW reviewed list of facilities and pt family would prefer [facility]. Pt step daughter also agreeable to CSW sending referral to [facility].   CSW called referral to both facilities.   [place], [place]

## 2014-09-24 NOTE — Progress Notes (Signed)
[facility] PROGRESS NOTE  Jesse Allen GEX:[id number] DOB: 1925/04/16 DOA: 09/22/2014 PCP: Gildardo Cranker, DO  HPI/Subjective:  He is an 81 year old gentleman with multiple comorbidities including hypertension, congestive heart failure, cognitive impairment, presented as a transfer from skilled nursing facility. Family reporting a significant functional decline over the past several months, with a steep decline in the last 2 weeks. He has had decreased by mouth intake, becoming minimally active, presenting with acute encephalopathy. Lab work revealed the presence of acute kidney injury likely secondary to profound dehydration. He was started on IV fluids and empiric IV and microbial therapy with ceftriaxone. Goals of care discussed with his healthcare power of attorney. She reports that patient has had a progressive decline over the last several months, particularly since transitioning to SNF. She feels that he has had a decrease will to live since making this transition, as she felt that for him this was not quality of life. He previously enjoyed staying active and being there for his family. She expressed  interest in a Palliative Care consultation to discuss further his goals of care. On exam he appeared cachectic, encephalopathic, lethargic. Despite receiving IV fluids and IV antimicrobial therapy he appeared worse. Palliative care facilitated discussions with goals of care. Family members expressed wishes to focus on comfort.    Assessment/Plan:  Principal Problem:   Acute encephalopathy Active Problems:   Elevated transaminase level   Alcohol abuse   CHF (congestive heart failure)   Acute on chronic kidney failure   Oral candidiasis   UTI (urinary tract infection)   Severe protein-calorie malnutrition  Acute encephalopathy  - Likely due to combination of UTI and dehydration, head CT negative for acute process. Patient is intermittently able to converse and follow commands, he is  not oriented to place or time but knows his own name. Began ceftriaxone 1 g IV daily  -Patient continued to decline despite IV antimicrobial therapy and IV fluids  UTI  -Had been on Rocephin   Severe malnutrition  - Patient's family indicates he has had a decreased appetite and rapid decline since entering SNF. He has had a worsened poor oral intake x 2 weeks.   Acute on chronic kidney failure  -Cr 3.32 on admission down to 2.01 with IV fluid administration.  -Despite this intervention and improvement in creatinine he continues to decline  Goals of care -Palliative care consulted to facilitate discussions regarding his goals of care. Family members reporting that he has had a steep functional decline in the past 2 weeks, it dries by minimal by mouth intake, having increase in his healthcare needs, remaining immobile. Family members feel that he started declining since losing his independence and requiring placement at a facility. They explain that he found great joy in remaining active around his home, fixing things and being there for his family members. They state that he "hated the hospital" and to him quality of life would not have consisted of repeated hospitalizations. -I explained that despite receiving IV fluids and IV antibiotic therapy as well as improvement to creatinine he actually appeared worse today. I think he may be actively passing away. After discussion with palliative care family members expressing wishes to focus on full comfort and the discontinuation of IV and microbial therapy. They are interested in inpatient hospice.    Code Status: DNR Family Communication: Daughter-in-law/HPOA at bedside, goals of care discussed.  Disposition Plan: Discharge to inpatient hospice   Consultants:  Speech/Swallowing Consult  Palliative Care  Procedures:  None  Antibiotics:  Ceftriaxone 09/23/2014   fluconazole 09/23/2014   Objective: Filed Vitals:   09/24/14 0744   BP: 134/56  Pulse: 72  Temp:   Resp:     Intake/Output Summary (Last 24 hours) at 09/24/14 1433 Last data filed at 09/24/14 0800  Gross per 24 hour  Intake 1609.17 ml  Output   1055 ml  Net 554.17 ml   Filed Weights   09/22/14 1342 09/23/14 0612 09/24/14 0544  Weight: 65.8 kg (145 lb 1 oz) 69.6 kg (153 lb 7 oz) 68.4 kg (150 lb 12.7 oz)    Exam:  Gen.: Ill-appearing, appears worse compared to yesterday's exam.  HENT:  Thrush apparent on tongue, dry oral mucosa Eyes: Conjunctivae and EOM are normal. PERRLA, no scleral icterus.  Neck: Normal ROM. Neck supple. No JVD. No tracheal deviation. No thyromegaly.  CVS: RRR, S1/S2 +, no carotid bruit.  Pulmonary: No stridor, mild crackles with diminished breath sounds at bases  Abdominal: Soft. BS +, no distension, tenderness, rebound or guarding, however, patient asked me to stop during exam without explanation Musculoskeletal: Normal range of motion.  Lymphadenopathy: No lymphadenopathy noted, cervical, inguinal. Neuro: Confused but moving all 4 extremities, not oriented to time or place, knows own name and can follow commands intermittently. Skin: Skin is warm and dry. No rash noted. Small lesions healing on both knees.    Data Reviewed: Basic Metabolic Panel:  Recent Labs Lab 09/22/14 0958 09/22/14 1636 09/23/14 0232 09/24/14 0410  NA 144  --  149* 156*  K 4.4  --  3.9 3.4*  CL 107  --  116* 124*  CO2 21*  --  19* 19*  GLUCOSE 107*  --  81 99  BUN 128*  --  106* 88*  CREATININE 3.32* 2.90* 2.49* 2.01*  CALCIUM 8.1*  --  7.9* 7.8*  MG  --  2.8*  --   --   PHOS  --  6.0*  --   --    Liver Function Tests:  Recent Labs Lab 09/22/14 0958 09/23/14 0232  AST 90* 82*  ALT 101* 93*  ALKPHOS 144* 128*  BILITOT 1.1 1.4*  PROT 6.6 6.0*  ALBUMIN 3.0* 2.7*   No results for input(s): LIPASE, AMYLASE in the last 168 hours. No results for input(s): AMMONIA in the last 168 hours. CBC:  Recent Labs Lab 09/22/14 0958  09/22/14 1636 09/23/14 0232 09/24/14 0410  WBC 5.0 3.6* 3.7* 3.0*  NEUTROABS 4.2  --   --   --   HGB 16.2 15.7 14.4 14.7  HCT 47.8 47.5 43.5 44.4  MCV 87.1 87.2 87.7 87.7  PLT 176 123* 182 177   Cardiac Enzymes: No results for input(s): CKTOTAL, CKMB, CKMBINDEX, TROPONINI in the last 168 hours. BNP (last 3 results)  Recent Labs  07/30/14 1200 08/03/14 0438  BNP 2824.4* 937.4*    ProBNP (last 3 results)  Recent Labs  02/08/14 1655 02/13/14 0405  PROBNP 62377.0* 23222.0*    CBG: No results for input(s): GLUCAP in the last 168 hours.  Recent Results (from the past 240 hour(s))  Urine culture     Status: None (Preliminary result)   Collection Time: 09/22/14  6:17 PM  Result Value Ref Range Status   Specimen Description URINE, RANDOM  Final   Special Requests NONE  Final   Culture CULTURE REINCUBATED FOR BETTER GROWTH  Final   Report Status PENDING  Incomplete  MRSA PCR Screening     Status: None   Collection Time:  09/22/14  6:28 PM  Result Value Ref Range Status   MRSA by PCR NEGATIVE NEGATIVE Final    Comment:        The GeneXpert MRSA Assay (FDA approved for NASAL specimens only), is one component of a comprehensive MRSA colonization surveillance program. It is not intended to diagnose MRSA infection nor to guide or monitor treatment for MRSA infections.   Clostridium Difficile by PCR (not at [facility])     Status: None   Collection Time: 09/23/14 12:57 PM  Result Value Ref Range Status   C difficile by pcr NEGATIVE NEGATIVE Final     Studies: No results found.  Scheduled Meds: . fluconazole (DIFLUCAN) IV  100 mg Intravenous Q24H  . sodium chloride  3 mL Intravenous Q12H   Continuous Infusions: . sodium chloride 75 mL/hr at 09/24/14 0551     Time spent: 35 minutes, 30 minutes spent in face-to-face discussion with family members regarding goals of care     Kelvin Cellar MD [facility] Pager [phone] 7PM-7AM, please contact  night-coverage at [email or web address], password [facility] 09/24/2014, 2:33 PM  LOS: 2 days

## 2014-09-24 NOTE — Consult Note (Signed)
Consultation Note Date: 09/24/2014   Patient Name: Jesse Allen  DOB: Apr 19, 1925  MRN: [id number]  Age / Sex: 81 y.o., male   PCP: Gildardo Cranker, DO Referring Physician: Kelvin Cellar, MD  Reason for Consultation: Establishing goals of care, Inpatient hospice referral, Non pain symptom management, Pain control, Psychosocial/spiritual support and Terminal care  Palliative Care Assessment and Plan Summary of Established Goals of Care and Medical Treatment Preferences    Palliative Care Discussion Held Today:    This NP Wadie Lessen reviewed medical records, received report from team, assessed the patient and then meet at the patient's bedside along with his daughter/HPOA Candi Leash and her SIL  to discuss diagnosis, prognosis, GOC, EOL wishes disposition and options.  A detailed discussion was had today regarding advanced directives.  Concepts specific to code status, artifical feeding and hydration, continued IV antibiotics and rehospitalization was had.  The difference between a aggressive medical intervention path  and a palliative comfort care path for this patient at this time was had.  Values and goals of care important to patient and family were attempted to be elicited.  Concept of Hospice and Palliative Care were discussed  Natural trajectory and expectations at EOL were discussed.  Questions and concerns addressed.  Hard Choices booklet left for review. Family encouraged to call with questions or concerns.  PMT will continue to support holistically.  MOST form completed  Family able to make decision for full comfort hoping for dignity and quality, understanding the limited prognosis, and  knowing these are the patient's documented  personal wishes.   Goals of Care/Code Status/Advance Care Planning:   Code Status: DNR/DNI  Artificial feeding/hydration:not now or in the future  Antibiotics:none  Diagnostics:none  Rehospitalization:avoid   Symptom Management:     Pain/Dyspnea: Morphine IV 1 mg every 1 hr prn  Agitation: Ativan IV 1 mg every 4 hrs prn  Psycho-social/Spiritual:   Support System: family  Desire for further Chaplaincy support:yes -consulted  Prognosis: < 2 weeks  Little po intake, altered electrolytes,  ARF, full comfort--continued physical, functional and cognitive decline  Discharge Planning:  Hospice facility       Chief Complaint: increased weakness, decreased cognition  History of Present Illness:   81 year old gentleman with multiple comorbidities including hypertension, congestive heart failure, cognitive impairment, presented as a transfer from skilled nursing facility. Family reporting a significant functional decline over the past several months, with a steep decline in the last 2 weeks. He has had decreased by mouth intake, becoming minimally active, presenting with acute encephalopathy. Lab work revealed the presence of acute kidney injury likely secondary to profound dehydration. He was started on IV fluids and empiric IV and microbial therapy with ceftriaxone. Goals of care discussed with his healthcare power of attorney. She reports that patient has had a progressive decline over the last several months, particularly since transitioning to SNF. She feels that he has had a decrease will to live since making this transition, as she felt that for him this was not quality of life. He previously enjoyed staying active and being there for his family.  Despite  IV fluids and IV antimicrobial therapy he continues to decline .   Faced with advanced directive decisions and anticipatory care needs.   Primary Diagnoses  Present on Admission:  . Acute encephalopathy . Acute on chronic kidney failure . Alcohol abuse . Elevated transaminase level . Oral candidiasis . UTI (urinary tract infection) . Severe protein-calorie malnutrition  Palliative Review of Systems:    -  unable to illicit due to decreased  cognition   I have reviewed the medical record, interviewed the patient and family, and examined the patient. The following aspects are pertinent.  Past Medical History  Diagnosis Date  . Gout   . Hypertension   . CHF (congestive heart failure)   . GERD (gastroesophageal reflux disease)    History   Social History  . Marital Status: Widowed    Spouse Name: N/A  . Number of Children: N/A  . Years of Education: N/A   Social History Main Topics  . Smoking status: Former Smoker -- 40 years  . Smokeless tobacco: Not on file     Comment: Quit 20 years ago as of 2016  . Alcohol Use: 0.0 oz/week    0 Standard drinks or equivalent per week     Comment: today- "a double shot" (occasional use)  . Drug Use: No  . Sexual Activity: Not on file   Other Topics Concern  . None   Social History Narrative   History reviewed. No pertinent family history. Scheduled Meds: . fluconazole (DIFLUCAN) IV  100 mg Intravenous Q24H  . sodium chloride  3 mL Intravenous Q12H   Continuous Infusions: . sodium chloride 75 mL/hr at 09/24/14 0551   PRN Meds:.LORazepam, morphine injection, ondansetron **OR** ondansetron (ZOFRAN) IV Medications Prior to Admission:  Prior to Admission medications   Medication Sig Start Date End Date Taking? Authorizing Provider  aspirin EC 81 MG EC tablet Take 1 tablet (81 mg total) by mouth daily. 12/01/12  Yes Barton Dubois, MD  carvedilol (COREG) 3.125 MG tablet Take 1 tablet (3.125 mg total) by mouth 2 (two) times daily with a meal. 08/05/14  Yes Nishant Dhungel, MD  Cholecalciferol (VITAMIN D3) 50000 UNITS TABS Take 1 tablet by mouth once a week.   Yes Historical Provider, MD  colchicine 0.6 MG tablet Take 1 tablet (0.6 mg total) by mouth 2 (two) times daily. 05/02/14  Yes Lauree Chandler, NP  famotidine (PEPCID) 20 MG tablet Take 20 mg by mouth 2 (two) times daily.   Yes Historical Provider, MD  folic acid (FOLVITE) 1 MG tablet Take 1 tablet (1 mg total) by mouth  daily. 02/16/14  Yes Hosie Poisson, MD  furosemide (LASIX) 40 MG tablet Take 1 tablet (40 mg total) by mouth 2 (two) times daily. Patient taking differently: Take 40 mg by mouth daily.  08/05/14  Yes Nishant Dhungel, MD  gabapentin (NEURONTIN) 100 MG capsule Take 1 capsule (100 mg total) by mouth 2 (two) times daily. 06/10/14  Yes Monica Carter, DO  isosorbide-hydrALAZINE (BIDIL) 20-37.5 MG per tablet Take 1 tablet by mouth 2 (two) times daily. 08/05/14  Yes Nishant Dhungel, MD  levothyroxine (SYNTHROID, LEVOTHROID) 50 MCG tablet Take 50 mcg by mouth daily before breakfast.   Yes Historical Provider, MD  loperamide (IMODIUM) 2 MG capsule Take 2 mg by mouth as needed for diarrhea or loose stools.   Yes Historical Provider, MD  Multiple Vitamin (MULTIVITAMIN WITH MINERALS) TABS tablet Take 1 tablet by mouth daily. 02/13/14  Yes Hosie Poisson, MD  sertraline (ZOLOFT) 25 MG tablet Take 12.5 mg by mouth daily.   Yes Historical Provider, MD  tamsulosin (FLOMAX) 0.4 MG CAPS capsule Take 1 capsule (0.4 mg total) by mouth daily after supper. 12/01/12  Yes Barton Dubois, MD  traMADol (ULTRAM) 50 MG tablet Take 1 tablet (50 mg total) by mouth 2 (two) times daily as needed. 09/14/14  Yes Alexa Sherral Hammers, MD  pantoprazole (  PROTONIX) 40 MG tablet Take 1 tablet (40 mg total) by mouth daily at 12 noon. Patient not taking: Reported on 09/22/2014 12/01/12   Barton Dubois, MD   No Known Allergies CBC:    Component Value Date/Time   WBC 3.0* 09/24/2014 0410   WBC 10.0 07/22/2014 1022   HGB 14.7 09/24/2014 0410   HCT 44.4 09/24/2014 0410   HCT 39.8 07/22/2014 1022   PLT 177 09/24/2014 0410   MCV 87.7 09/24/2014 0410   NEUTROABS 4.2 09/22/2014 0958   NEUTROABS 8.6* 07/22/2014 1022   LYMPHSABS 0.5* 09/22/2014 0958   LYMPHSABS 0.4* 07/22/2014 1022   MONOABS 0.3 09/22/2014 0958   EOSABS 0.0 09/22/2014 0958   BASOSABS 0.0 09/22/2014 0958   BASOSABS 0.0 07/22/2014 1022   Comprehensive Metabolic Panel:    Component  Value Date/Time   NA 156* 09/24/2014 0410   NA 137 07/22/2014 1022   K 3.4* 09/24/2014 0410   CL 124* 09/24/2014 0410   CO2 19* 09/24/2014 0410   BUN 88* 09/24/2014 0410   BUN 41* 07/22/2014 1022   CREATININE 2.01* 09/24/2014 0410   GLUCOSE 99 09/24/2014 0410   GLUCOSE 142* 07/22/2014 1022   CALCIUM 7.8* 09/24/2014 0410   AST 82* 09/23/2014 0232   ALT 93* 09/23/2014 0232   ALKPHOS 128* 09/23/2014 0232   BILITOT 1.4* 09/23/2014 0232   BILITOT 1.0 07/22/2014 1022   PROT 6.0* 09/23/2014 0232   PROT 5.8* 07/22/2014 1022   ALBUMIN 2.7* 09/23/2014 0232    Physical Exam:  Vital Signs: BP 134/56 mmHg  Pulse 72  Temp(Src) 98 F (36.7 C) (Oral)  Resp 18  Ht 5\' 10"  (1.778 m)  Wt 68.4 kg (150 lb 12.7 oz)  BMI 21.64 kg/m2  SpO2 98% SpO2: SpO2: 98 % O2 Device: O2 Device: Not Delivered O2 Flow Rate:   Intake/output summary:  Intake/Output Summary (Last 24 hours) at 09/24/14 1357 Last data filed at 09/24/14 0800  Gross per 24 hour  Intake 1609.17 ml  Output   1055 ml  Net 554.17 ml   LBM: Last BM Date: 09/23/14 Baseline Weight: Weight: 65.8 kg (145 lb 1 oz) Most recent weight: Weight: 68.4 kg (150 lb 12.7 oz)  Exam Findings:   General: ill appearing, appears generally uncomfortable HEENT: moist buccal membranes CVS: RRR Resp: decreased in bases Skin: warm and dry         Palliative Performance Scale: 30  %                Additional Data Reviewed: Recent Labs     09/23/14  0232  09/24/14  0410  WBC  3.7*  3.0*  HGB  14.4  14.7  PLT  182  177  NA  149*  156*  BUN  106*  88*  CREATININE  2.49*  2.01*     Time In: 1300 Time Out: 1415 Time Total: 75 min  Greater than 50%  of this time was spent counseling and coordinating care related to the above assessment and plan.  Discussed with  Dr  Coralyn Pear  Signed by: Wadie Lessen, NP  Knox Royalty, NP  09/24/2014, 1:57 PM  Please contact Palliative Medicine Team phone at [phone] for questions and concerns.    See AMION for contact information

## 2014-09-24 NOTE — Progress Notes (Signed)
   09/24/14 1600  Clinical Encounter Type  Visited With Health care provider;Patient not available  Visit Type Other (Comment) (End of Life)  Referral From Nurse  Consult/Referral To Chaplain  Spiritual Encounters  Spiritual Needs Other (Comment) (Family support as needed)  Stress Factors  Family Stress Factors Loss;Major life changes  CH spoke with RN regarding EOL; PT unavailable and family not present; MD notified family of PT status; [place] Support as needed for family.

## 2014-09-24 NOTE — Progress Notes (Signed)
Physical Therapy Treatment Patient Details Name: BRAYCEN BURANDT MRN: [id number] DOB: 09-26-1925 Today's Date: 09/24/2014    History of Present Illness Pt presents from ALF with h/o HTN, gout, GERD, and CHF, now with AMS and increasing lethargy as well as poor intake. Found to have UTI.    PT Comments    Pt with lack of verbal responses throughout session today, not resisting mobility but also not significantly following commands to assist either. Pt assisted with limited bil LE HEP. Pt gurgling throughout session but unable to suction anything out of his mouth and he would not spit. Positioned in chair position end of session with RUE propped on pillow and heels elevated.   Follow Up Recommendations  SNF;Supervision/Assistance - 24 hour     Equipment Recommendations       Recommendations for Other Services       Precautions / Restrictions Precautions Precautions: Fall    Mobility  Bed Mobility Overal bed mobility: Needs Assistance       Supine to sit: HOB elevated;Total assist Sit to supine: Total assist   General bed mobility comments: Pt with HOB elevated semi rolled to right side with total assist to bring legs off of and onto surface and very limited assist to elevate trunk from surface. Total assist to scoot toward HOB in trendelenburg. pt attempted to prop on RUE in sitting but unable to maintain  Transfers                    Ambulation/Gait                 Stairs            Wheelchair Mobility    Modified Rankin (Stroke Patients Only)       Balance     Sitting balance-Leahy Scale: Zero                              Cognition Arousal/Alertness: Lethargic Behavior During Therapy: Flat affect                   General Comments: pt non verbal throughout session and not answering any questions but would attempt to follow mobility commands at times grossly 20%    Exercises General Exercises - Lower Extremity Short  Arc Quad: PROM;Both;10 reps;Supine Heel Slides: AAROM;Both;10 reps;Supine    General Comments        Pertinent Vitals/Pain Pain Assessment: Faces Faces Pain Scale: No hurt    Home Living                      Prior Function            PT Goals (current goals can now be found in the care plan section) Progress towards PT goals: Not progressing toward goals - comment    Frequency       PT Plan Current plan remains appropriate    Co-evaluation             End of Session   Activity Tolerance: Patient limited by lethargy;Patient limited by fatigue Patient left: in bed;with bed alarm set;with call bell/phone within reach     Time: 1200-1212 PT Time Calculation (min) (ACUTE ONLY): 12 min  Charges:  $Therapeutic Activity: 8-22 mins                    G Codes:  Lanetta Inch Beth 09/24/2014, 12:18 PM Elwyn Reach, [place]

## 2014-09-25 DIAGNOSIS — R627 Adult failure to thrive: Secondary | ICD-10-CM

## 2014-09-25 LAB — URINE CULTURE

## 2014-09-25 NOTE — Discharge Summary (Signed)
Physician Discharge Summary  LINWOOD GULLIKSON DTO:[id number] DOB: Apr 20, 1925 DOA: 09/22/2014  PCP: Gildardo Cranker, DO  Admit date: 09/22/2014 Discharge date: 09/25/2014  Time spent: 35 minutes  Recommendations for Outpatient Follow-up:  1. Patient discharged to Inpatient Hospice  Discharge Diagnoses:  Principal Problem:   Acute encephalopathy Active Problems:   Elevated transaminase level   Alcohol abuse   CHF (congestive heart failure)   Acute on chronic kidney failure   Oral candidiasis   UTI (urinary tract infection)   Severe protein-calorie malnutrition   Palliative care encounter   Adult failure to thrive syndrome   DNR (do not resuscitate) discussion   Discharge Condition: Transferring to Hospice  Diet recommendation: As tolerated  Filed Weights   09/23/14 0612 09/24/14 0544 09/25/14 0417  Weight: 69.6 kg (153 lb 7 oz) 68.4 kg (150 lb 12.7 oz) 66.9 kg (147 lb 7.8 oz)    History of present illness:  Pt is 81 yo male, resident of SNF, presented to [facility] ED from SNF for evaluation of 1-2 days duration of progressive lethargy, poor oral intake. Please note that pt is unable to provide any history at the time of the admission due to altered mental status. Most of the information obtained from ED doctor Dr. Vanita Panda, no reported fevers, chills, no reported abd or urinary concerns. Review of records indicate pt has known history of alcohol abuse but this was reported per SNF staff.   In Ed, pt visibly altered and confused, unable to provide nay history but not in acute distress, VSS, blood work notable for Cr 3.32, transaminitis. TRH asked to admit for further evaluation. No family at bedside, pt apparently full code per SNF report.   Hospital Course:  He is an 81 year old gentleman with multiple comorbidities including hypertension, congestive heart failure, cognitive impairment, presented as a transfer from skilled nursing facility. Family reporting a significant functional decline over  the past several months, with a steep decline in the last 2 weeks. He has had decreased by mouth intake, becoming minimally active, presenting with acute encephalopathy. Lab work revealed the presence of acute kidney injury likely secondary to profound dehydration. He was started on IV fluids and empiric IV and microbial therapy with ceftriaxone. Goals of care discussed with his healthcare power of attorney. She reports that patient has had a progressive decline over the last several months, particularly since transitioning to SNF. She feels that he has had a decrease will to live since making this transition, as she felt that for him this was not quality of life. He previously enjoyed staying active and being there for his family. She expressed interest in a Palliative Care consultation to discuss further his goals of care. On exam he appeared cachectic, encephalopathic, lethargic. Despite receiving IV fluids and IV antimicrobial therapy he appeared worse. Palliative care facilitated discussions with goals of care. Family members expressed wishes to focus on comfort.   Acute encephalopathy  - Likely due to combination of UTI and dehydration, head CT negative for acute process. Patient is intermittently able to converse and follow commands, he is not oriented to place or time but knows his own name. Began ceftriaxone 1 g IV daily  -Patient continued to decline despite IV antimicrobial therapy and IV fluids -Transitioned to comfort care  UTI  -Had been on Rocephin.   Severe malnutrition  - Patient's family indicates he has had a decreased appetite and rapid decline since entering SNF. He has had a worsened poor oral intake x 2 weeks.  Acute on chronic kidney failure  -Cr 3.32 on admission down to 2.01 with IV fluid administration.  -Despite this intervention and improvement in creatinine he continues to decline  Goals of care -Palliative care consulted to facilitate discussions regarding his  goals of care. Family members reporting that he has had a steep functional decline in the past 2 weeks, it dries by minimal by mouth intake, having increase in his healthcare needs, remaining immobile. Family members feel that he started declining since losing his independence and requiring placement at a facility. They explain that he found great joy in remaining active around his home, fixing things and being there for his family members. They state that he "hated the hospital" and to him quality of life would not have consisted of repeated hospitalizations. -I explained that despite receiving IV fluids and IV antibiotic therapy as well as improvement to creatinine he actually appeared worse today. I think he may be actively passing away. After discussion with palliative care family members expressing wishes to focus on full comfort and the discontinuation of IV and microbial therapy.  -Patient transferred to Inpatient Hospice   Consultations:  Palliative Care  Discharge Exam: Filed Vitals:   09/25/14 0654  BP: 132/60  Pulse: 76  Temp: 98 F (36.7 C)  Resp: 20    Gen.: Ill-appearing, non verbal HENT: Thrush apparent on tongue, dry oral mucosa CVS: RRR, S1/S2 +, no carotid bruit.  Pulmonary: Coarse respiratory sounds, positive rhonchi Abdominal: Soft. BS +, no distension, tenderness, rebound or guarding, Neuro: Confused but moving all 4 extremities  Discharge Instructions   Discharge Instructions    Call MD for:  persistant nausea and vomiting    Complete by:  As directed      Call MD for:  severe uncontrolled pain    Complete by:  As directed      Diet - low sodium heart healthy    Complete by:  As directed      Increase activity slowly    Complete by:  As directed           Current Discharge Medication List    STOP taking these medications     aspirin EC 81 MG EC tablet      carvedilol (COREG) 3.125 MG tablet      Cholecalciferol (VITAMIN D3) 50000 UNITS TABS       colchicine 0.6 MG tablet      famotidine (PEPCID) 20 MG tablet      folic acid (FOLVITE) 1 MG tablet      furosemide (LASIX) 40 MG tablet      gabapentin (NEURONTIN) 100 MG capsule      isosorbide-hydrALAZINE (BIDIL) 20-37.5 MG per tablet      levothyroxine (SYNTHROID, LEVOTHROID) 50 MCG tablet      loperamide (IMODIUM) 2 MG capsule      Multiple Vitamin (MULTIVITAMIN WITH MINERALS) TABS tablet      sertraline (ZOLOFT) 25 MG tablet      tamsulosin (FLOMAX) 0.4 MG CAPS capsule      traMADol (ULTRAM) 50 MG tablet      pantoprazole (PROTONIX) 40 MG tablet        No Known Allergies    The results of significant diagnostics from this hospitalization (including imaging, microbiology, ancillary and laboratory) are listed below for reference.    Significant Diagnostic Studies: Dg Cervical Spine 2-3 Views  09/14/2014   CLINICAL DATA:  [place] out of the head, 2 feet to the ground. Left-sided neck  pain.  EXAM: CERVICAL SPINE - 2-3 VIEW  COMPARISON:  None.  FINDINGS: Two-view examination shows chronic degenerative spondylosis from C3-4 through C6-7. No visible fracture or soft tissue swelling.  IMPRESSION: No traumatic finding on this two view study. Chronic degenerative spondylosis.   Electronically Signed   By: Nelson Chimes M.D.   On: 09/14/2014 09:28   Ct Head Wo Contrast  09/22/2014   CLINICAL DATA:  Abnormal speech today.  Initial encounter.  EXAM: CT HEAD WITHOUT CONTRAST  TECHNIQUE: Contiguous axial images were obtained from the base of the skull through the vertex without intravenous contrast.  COMPARISON:  Head CT scan 05/11/2014.  FINDINGS: Atrophy and chronic microvascular ischemic change are identified as on the prior examination. There is no evidence of acute intracranial abnormality including hemorrhage, infarct, mass lesion, mass effect, midline shift or abnormal extra-axial fluid collection. No hydrocephalus or pneumocephalus. The calvarium is intact.  IMPRESSION: No acute  finding.  Atrophy and chronic microvascular ischemic change.   Electronically Signed   By: Inge Rise M.D.   On: 09/22/2014 10:41   Dg Chest Port 1 View  09/22/2014   CLINICAL DATA:  Altered mental status  EXAM: PORTABLE CHEST - 1 VIEW  COMPARISON:  Jul 30, 2014  FINDINGS: There is no edema or consolidation. The heart size and pulmonary vascularity are normal. No appreciable adenopathy. No bone lesions.  IMPRESSION: No edema or consolidation.   Electronically Signed   By: Lowella Grip III M.D.   On: 09/22/2014 09:49   Dg Shoulder Left  09/14/2014   CLINICAL DATA:  Rolled out of bed this morning and fell 2 feet to the ground. Shoulder pain.  EXAM: LEFT SHOULDER - 2+ VIEW  COMPARISON:  None.  FINDINGS: There chronic degenerative changes of the glenohumeral joint with cystic change in the inferior glenoid. Humeral head appears properly located. Narrowing of the distance between the humeral head and acromial arch could indicate rotator cuff tear, age indeterminate. No evidence of AC joint dislocation or clavicle fracture.  IMPRESSION: No acute fracture or dislocation. Chronic degenerative changes of the glenohumeral joint. Narrowing of the humeral acromial distance consistent with rotator cuff tear, age indeterminate.   Electronically Signed   By: Nelson Chimes M.D.   On: 09/14/2014 09:27    Microbiology: Recent Results (from the past 240 hour(s))  Urine culture     Status: None   Collection Time: 09/22/14  6:17 PM  Result Value Ref Range Status   Specimen Description URINE, RANDOM  Final   Special Requests NONE  Final   Culture   Final    >=100,000 COLONIES/mL PROVIDENCIA STUARTII >=100,000 COLONIES/mL PROTEUS MIRABILIS    Report Status 09/25/2014 FINAL  Final   Organism ID, Bacteria PROVIDENCIA STUARTII  Final   Organism ID, Bacteria PROTEUS MIRABILIS  Final      Susceptibility   Proteus mirabilis - MIC*    AMPICILLIN <=2 SENSITIVE Sensitive     CEFAZOLIN <=4 SENSITIVE Sensitive      CEFTRIAXONE <=1 SENSITIVE Sensitive     CIPROFLOXACIN >=4 RESISTANT Resistant     GENTAMICIN <=1 SENSITIVE Sensitive     IMIPENEM 2 SENSITIVE Sensitive     NITROFURANTOIN 128 RESISTANT Resistant     TRIMETH/SULFA >=320 RESISTANT Resistant     AMPICILLIN/SULBACTAM <=2 SENSITIVE Sensitive     PIP/TAZO <=4 SENSITIVE Sensitive     * >=100,000 COLONIES/mL PROTEUS MIRABILIS   Providencia stuartii - MIC*    AMPICILLIN <=2 RESISTANT Resistant  CEFAZOLIN >=64 RESISTANT Resistant     CEFTRIAXONE <=1 SENSITIVE Sensitive     CIPROFLOXACIN >=4 RESISTANT Resistant     GENTAMICIN 8 RESISTANT Resistant     IMIPENEM 1 SENSITIVE Sensitive     NITROFURANTOIN 128 RESISTANT Resistant     TRIMETH/SULFA <=20 SENSITIVE Sensitive     AMPICILLIN/SULBACTAM <=2 SENSITIVE Sensitive     PIP/TAZO <=4 SENSITIVE Sensitive     * >=100,000 COLONIES/mL PROVIDENCIA STUARTII  MRSA PCR Screening     Status: None   Collection Time: 09/22/14  6:28 PM  Result Value Ref Range Status   MRSA by PCR NEGATIVE NEGATIVE Final    Comment:        The GeneXpert MRSA Assay (FDA approved for NASAL specimens only), is one component of a comprehensive MRSA colonization surveillance program. It is not intended to diagnose MRSA infection nor to guide or monitor treatment for MRSA infections.   Clostridium Difficile by PCR (not at [facility])     Status: None   Collection Time: 09/23/14 12:57 PM  Result Value Ref Range Status   C difficile by pcr NEGATIVE NEGATIVE Final     Labs: Basic Metabolic Panel:  Recent Labs Lab 09/22/14 0958 09/22/14 1636 09/23/14 0232 09/24/14 0410  NA 144  --  149* 156*  K 4.4  --  3.9 3.4*  CL 107  --  116* 124*  CO2 21*  --  19* 19*  GLUCOSE 107*  --  81 99  BUN 128*  --  106* 88*  CREATININE 3.32* 2.90* 2.49* 2.01*  CALCIUM 8.1*  --  7.9* 7.8*  MG  --  2.8*  --   --   PHOS  --  6.0*  --   --    Liver Function Tests:  Recent Labs Lab 09/22/14 0958 09/23/14 0232  AST 90* 82*  ALT  101* 93*  ALKPHOS 144* 128*  BILITOT 1.1 1.4*  PROT 6.6 6.0*  ALBUMIN 3.0* 2.7*   No results for input(s): LIPASE, AMYLASE in the last 168 hours. No results for input(s): AMMONIA in the last 168 hours. CBC:  Recent Labs Lab 09/22/14 0958 09/22/14 1636 09/23/14 0232 09/24/14 0410  WBC 5.0 3.6* 3.7* 3.0*  NEUTROABS 4.2  --   --   --   HGB 16.2 15.7 14.4 14.7  HCT 47.8 47.5 43.5 44.4  MCV 87.1 87.2 87.7 87.7  PLT 176 123* 182 177   Cardiac Enzymes: No results for input(s): CKTOTAL, CKMB, CKMBINDEX, TROPONINI in the last 168 hours. BNP: BNP (last 3 results)  Recent Labs  07/30/14 1200 08/03/14 0438  BNP 2824.4* 937.4*    ProBNP (last 3 results)  Recent Labs  02/08/14 1655 02/13/14 0405  PROBNP 62377.0* 23222.0*    CBG: No results for input(s): GLUCAP in the last 168 hours.     SignedKelvin Cellar  [facility] 09/25/2014, 10:29 AM

## 2014-09-25 NOTE — Progress Notes (Signed)
Report called to [place] at [facility] place.  Verbalized understanding.  Will continue to monitor.  Karie Kirks, Therapist, sports.

## 2014-09-25 NOTE — Progress Notes (Signed)
[facility] room available for Jesse Allen today. HCPOA completed paperwork for transfer. Dr. Orpah Melter to assume care.  RN please call report to [phone].  [facility] Liaison [phone]

## 2014-09-25 NOTE — Care Management (Signed)
Important Message  Patient Details  Name: Jesse Allen MRN: [id number] Date of Birth: 24-Jan-1926   Medicare Important Message Given:  Yes-second notification given    Nathen May 09/25/2014, 10:17 AM

## 2014-09-25 NOTE — Progress Notes (Signed)
At 1626 pt d/c to [facility] via ambulance.  Karie Kirks, Therapist, sports.

## 2014-09-25 NOTE — Progress Notes (Signed)
81 year old male- admitted from [facility] with increased confusion and lethargy. Patient is unable to provide any information or history. CSW met with patient's HCPOA and stepdaughter- Candi Leash this afternoon who confirmed that he is a resident of [facility] and has only been there a few months. She stated that prior to this he was living independently at home. She has noticed a digression in his medical health over the recent weeks and is very concerned about his current condition. CSW briefly explained role of the CSW as well as potential d/c options of return to ALF vs short term SNF if indicated. Will need to await PT evaluation and determination by MD.  Mrs. Marcelline Deist was very pleased with this information and CSW stated that would need follow up once more information has been determined.  Lorie Phenix. Pauline Good, [place]

## 2014-09-25 NOTE — Progress Notes (Signed)
Late Entry: CSW notified by Erling Conte- LCSW Liason of bed availability at [facility] for today.  She met with patient's HCPOA/Step-daughter Loletha Grayer this morning to complete paperwork.  Nursing notified and report called to facility. EMS transport arranged.  CSW spoke to Mrs. Cardwell who stated "He is tired; I am at peace with residential hospice and glad that this could be arranged." CSW met with patient; he would open his eyes when his name was called but did not respond to this CSW.  Packet completed for EMS transfer. CSW signing off.  Lorie Phenix. Pauline Good, [place]

## 2014-10-05 ENCOUNTER — Telehealth: Payer: Self-pay | Admitting: Cardiology

## 2014-10-05 ENCOUNTER — Telehealth: Payer: Self-pay

## 2014-10-05 NOTE — Telephone Encounter (Signed)
Step-daughter called to let us know patient passed away 10/12/14.

## 2014-10-05 NOTE — Telephone Encounter (Signed)
New message     Pt step daughter states that patient passed away on Oct 01, 2022

## 2014-10-19 DEATH — deceased

## 2014-12-10 ENCOUNTER — Ambulatory Visit: Payer: Self-pay | Admitting: Cardiology

## 2015-09-23 IMAGING — DX DG CERVICAL SPINE 2 OR 3 VIEWS
3 series · 3 of 3 positions shown · non-contrast
Comparison: None.

CLINICAL DATA: Fell out of the head, 2 feet to the ground.
Left-sided neck pain.

EXAM:
CERVICAL SPINE - 2-3 VIEW

[c-spine lat]
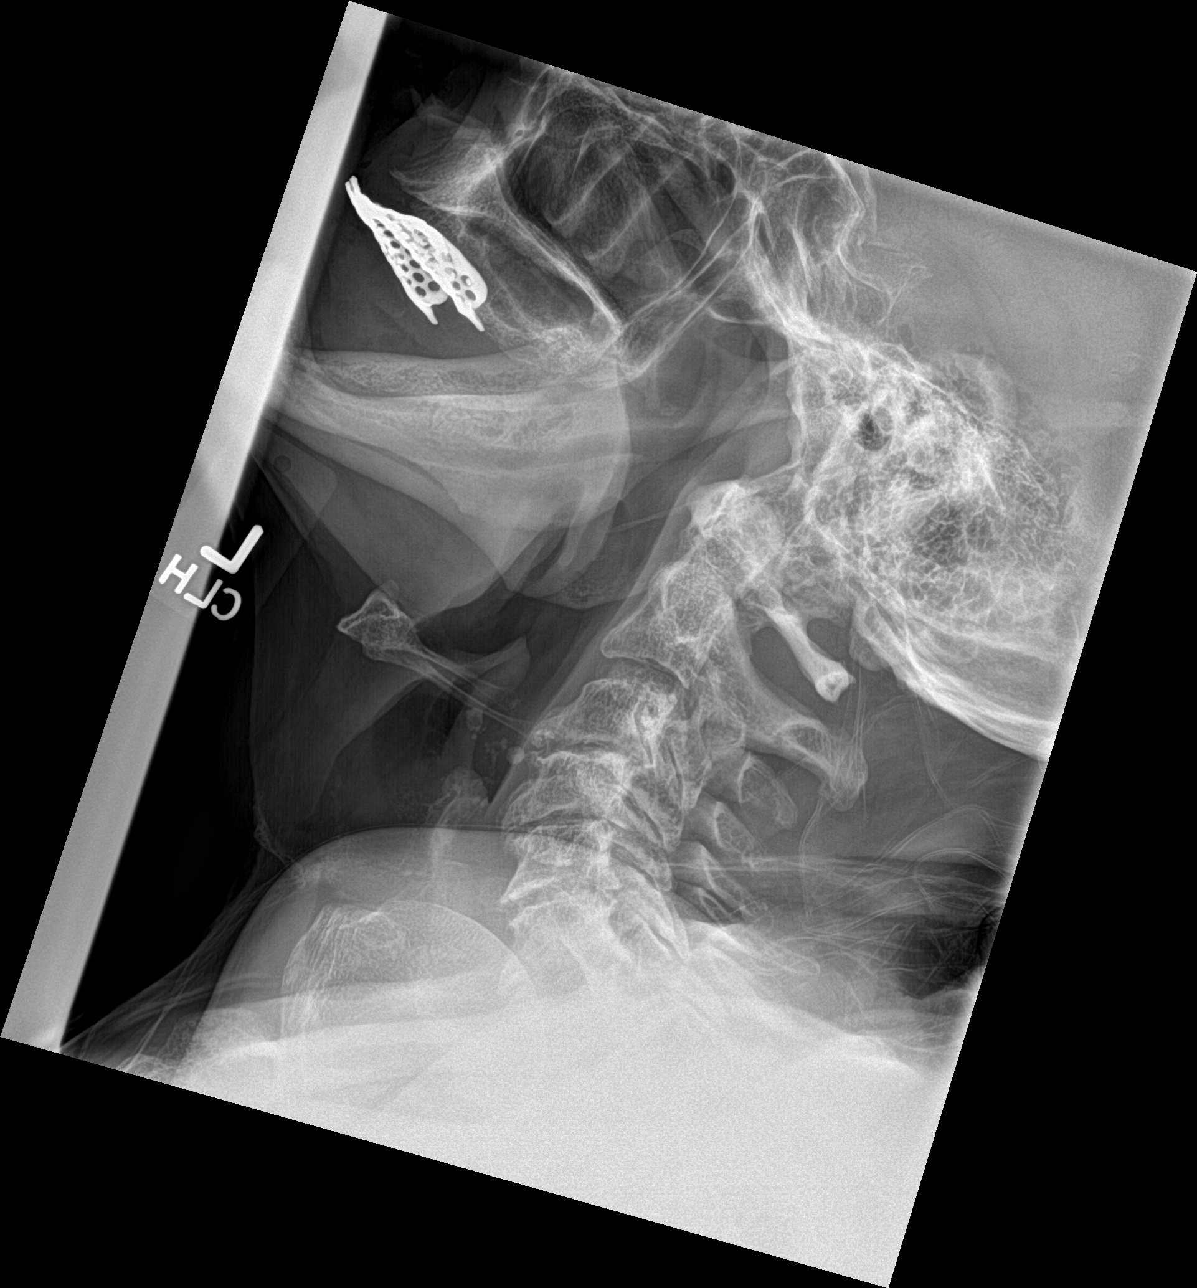

[c-spine ap]
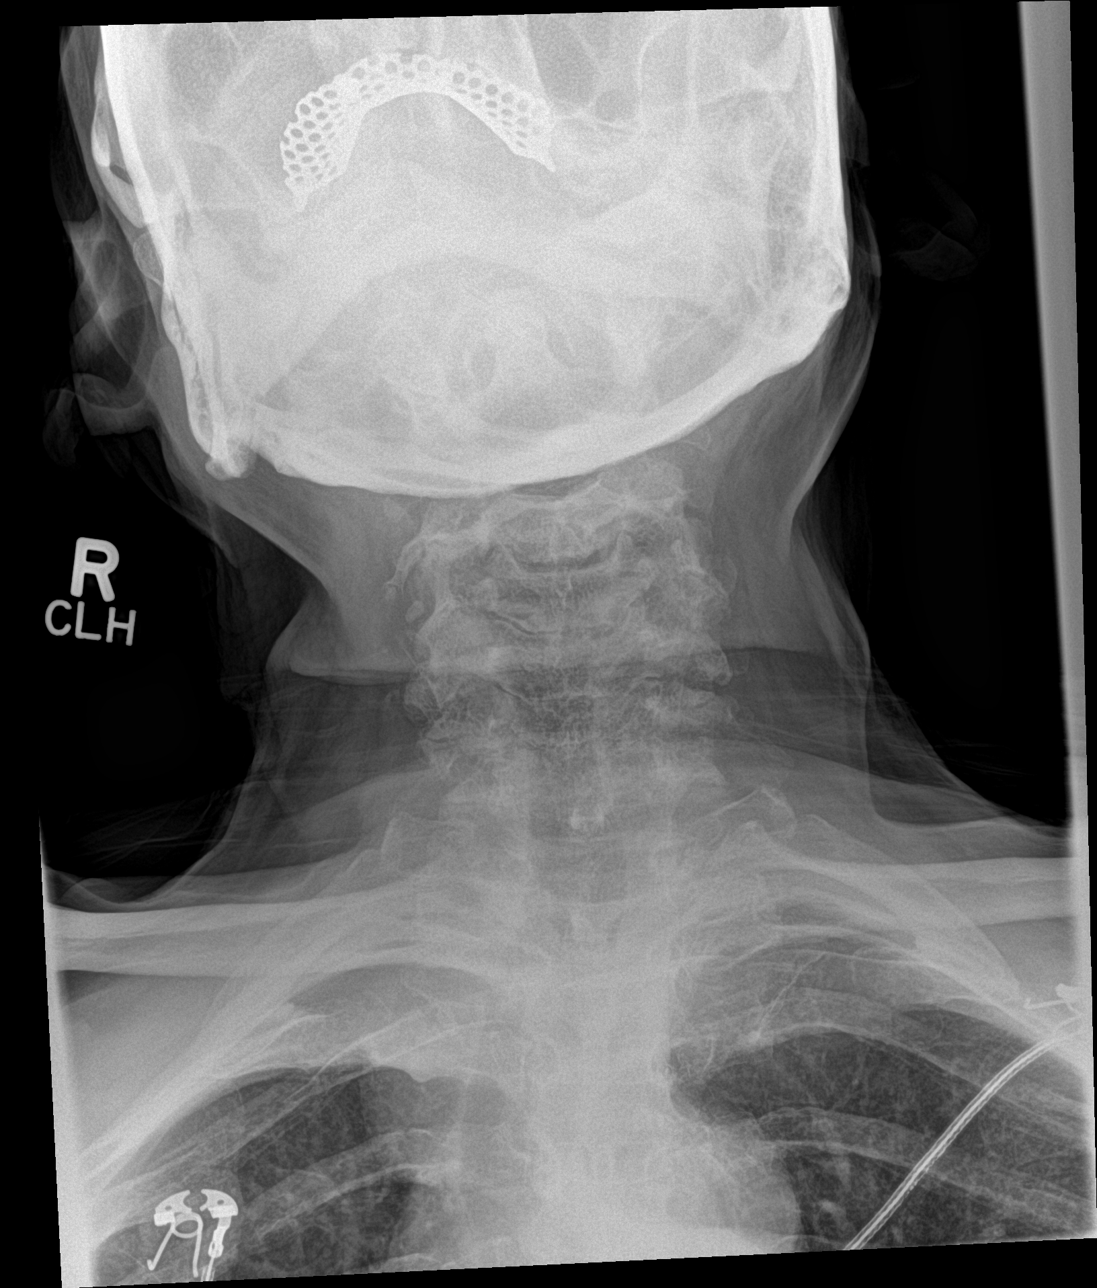

[c-spine swimmers]
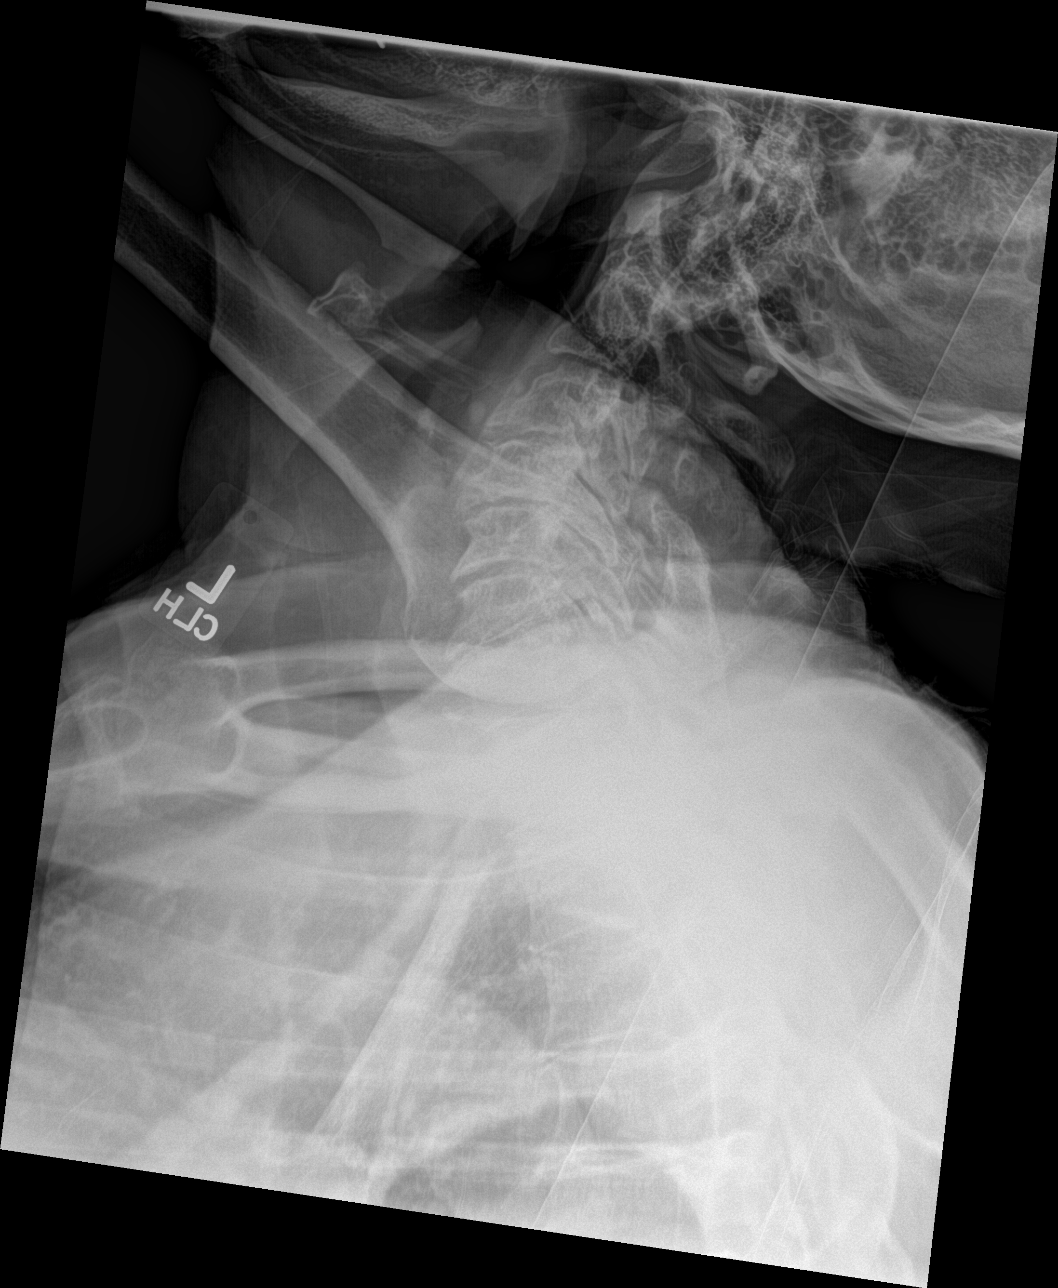

[3 of 3 positions shown; findings below may reference images not displayed]

FINDINGS: Two-view examination shows chronic degenerative spondylosis from
C3-4 through C6-7. No visible fracture or soft tissue swelling.
IMPRESSION: No traumatic finding on this two view study. Chronic degenerative
spondylosis.

## 2017-04-23 NOTE — Telephone Encounter (Signed)
Noted...cdavis  °

## 2017-11-07 ENCOUNTER — Encounter: Payer: Self-pay | Admitting: Internal Medicine
